# Patient Record
Sex: Female | Born: 1970 | Race: White | Hispanic: No | Smoking: Never smoker
Health system: Southern US, Community
[De-identification: ages and names within clinical notes are randomized; demographics above are authoritative.]

## PROBLEM LIST (undated history)

## (undated) DIAGNOSIS — J309 Allergic rhinitis, unspecified: Secondary | ICD-10-CM

## (undated) DIAGNOSIS — M1990 Unspecified osteoarthritis, unspecified site: Secondary | ICD-10-CM

## (undated) DIAGNOSIS — R569 Unspecified convulsions: Secondary | ICD-10-CM

## (undated) DIAGNOSIS — M359 Systemic involvement of connective tissue, unspecified: Secondary | ICD-10-CM

## (undated) DIAGNOSIS — IMO0002 Reserved for concepts with insufficient information to code with codable children: Secondary | ICD-10-CM

## (undated) DIAGNOSIS — Z982 Presence of cerebrospinal fluid drainage device: Secondary | ICD-10-CM

## (undated) DIAGNOSIS — A048 Other specified bacterial intestinal infections: Secondary | ICD-10-CM

## (undated) DIAGNOSIS — J45909 Unspecified asthma, uncomplicated: Secondary | ICD-10-CM

## (undated) DIAGNOSIS — R946 Abnormal results of thyroid function studies: Secondary | ICD-10-CM

## (undated) DIAGNOSIS — K219 Gastro-esophageal reflux disease without esophagitis: Secondary | ICD-10-CM

## (undated) DIAGNOSIS — N644 Mastodynia: Secondary | ICD-10-CM

## (undated) DIAGNOSIS — F32A Depression, unspecified: Secondary | ICD-10-CM

## (undated) DIAGNOSIS — R5382 Chronic fatigue, unspecified: Secondary | ICD-10-CM

## (undated) DIAGNOSIS — I10 Essential (primary) hypertension: Secondary | ICD-10-CM

## (undated) DIAGNOSIS — F3340 Major depressive disorder, recurrent, in remission, unspecified: Secondary | ICD-10-CM

## (undated) DIAGNOSIS — M329 Systemic lupus erythematosus, unspecified: Secondary | ICD-10-CM

## (undated) DIAGNOSIS — G894 Chronic pain syndrome: Secondary | ICD-10-CM

## (undated) DIAGNOSIS — G8929 Other chronic pain: Secondary | ICD-10-CM

## (undated) DIAGNOSIS — Q031 Atresia of foramina of Magendie and Luschka: Secondary | ICD-10-CM

## (undated) DIAGNOSIS — J019 Acute sinusitis, unspecified: Secondary | ICD-10-CM

## (undated) DIAGNOSIS — R768 Other specified abnormal immunological findings in serum: Secondary | ICD-10-CM

## (undated) DIAGNOSIS — S065X9A Traumatic subdural hemorrhage with loss of consciousness of unspecified duration, initial encounter: Secondary | ICD-10-CM

## (undated) DIAGNOSIS — F329 Major depressive disorder, single episode, unspecified: Secondary | ICD-10-CM

## (undated) DIAGNOSIS — M6702 Short Achilles tendon (acquired), left ankle: Secondary | ICD-10-CM

## (undated) DIAGNOSIS — D242 Benign neoplasm of left breast: Secondary | ICD-10-CM

## (undated) DIAGNOSIS — T7840XA Allergy, unspecified, initial encounter: Secondary | ICD-10-CM

## (undated) DIAGNOSIS — R42 Dizziness and giddiness: Secondary | ICD-10-CM

## (undated) DIAGNOSIS — I6203 Nontraumatic chronic subdural hemorrhage: Secondary | ICD-10-CM

## (undated) DIAGNOSIS — G43909 Migraine, unspecified, not intractable, without status migrainosus: Secondary | ICD-10-CM

## (undated) DIAGNOSIS — F419 Anxiety disorder, unspecified: Secondary | ICD-10-CM

## (undated) DIAGNOSIS — R7303 Prediabetes: Secondary | ICD-10-CM

## (undated) DIAGNOSIS — M170 Bilateral primary osteoarthritis of knee: Secondary | ICD-10-CM

## (undated) DIAGNOSIS — M25562 Pain in left knee: Secondary | ICD-10-CM

## (undated) DIAGNOSIS — A6000 Herpesviral infection of urogenital system, unspecified: Secondary | ICD-10-CM

## (undated) DIAGNOSIS — G919 Hydrocephalus, unspecified: Secondary | ICD-10-CM

## (undated) DIAGNOSIS — R1084 Generalized abdominal pain: Secondary | ICD-10-CM

## (undated) DIAGNOSIS — R1031 Right lower quadrant pain: Secondary | ICD-10-CM

## (undated) DIAGNOSIS — E785 Hyperlipidemia, unspecified: Secondary | ICD-10-CM

## (undated) DIAGNOSIS — R350 Frequency of micturition: Secondary | ICD-10-CM

## (undated) DIAGNOSIS — K625 Hemorrhage of anus and rectum: Secondary | ICD-10-CM

## (undated) DIAGNOSIS — E782 Mixed hyperlipidemia: Secondary | ICD-10-CM

## (undated) DIAGNOSIS — E538 Deficiency of other specified B group vitamins: Secondary | ICD-10-CM

## (undated) DIAGNOSIS — Z1239 Encounter for other screening for malignant neoplasm of breast: Secondary | ICD-10-CM

## (undated) DIAGNOSIS — R635 Abnormal weight gain: Secondary | ICD-10-CM

## (undated) DIAGNOSIS — M2391 Unspecified internal derangement of right knee: Secondary | ICD-10-CM

## (undated) DIAGNOSIS — M2550 Pain in unspecified joint: Secondary | ICD-10-CM

## (undated) DIAGNOSIS — G40909 Epilepsy, unspecified, not intractable, without status epilepticus: Secondary | ICD-10-CM

## (undated) DIAGNOSIS — Z8781 Personal history of (healed) traumatic fracture: Secondary | ICD-10-CM

## (undated) DIAGNOSIS — M722 Plantar fascial fibromatosis: Secondary | ICD-10-CM

## (undated) DIAGNOSIS — E559 Vitamin D deficiency, unspecified: Secondary | ICD-10-CM

## (undated) DIAGNOSIS — F411 Generalized anxiety disorder: Secondary | ICD-10-CM

## (undated) HISTORY — DX: Unspecified asthma, uncomplicated: J45.909

## (undated) HISTORY — DX: Allergy, unspecified, initial encounter: T78.40XA

## (undated) HISTORY — DX: Essential (primary) hypertension: I10

## (undated) HISTORY — DX: Hyperlipidemia, unspecified: E78.5

## (undated) HISTORY — DX: Other chronic pain: G89.29

## (undated) HISTORY — DX: Unspecified internal derangement of right knee: M23.91

## (undated) HISTORY — DX: Reserved for concepts with insufficient information to code with codable children: IMO0002

## (undated) HISTORY — DX: Acute sinusitis, unspecified: J01.90

## (undated) HISTORY — DX: Benign neoplasm of left breast: D24.2

## (undated) HISTORY — DX: Generalized abdominal pain: R10.84

## (undated) HISTORY — DX: Nontraumatic chronic subdural hemorrhage: I62.03

## (undated) HISTORY — DX: Allergic rhinitis, unspecified: J30.9

## (undated) HISTORY — DX: Other specified abnormal immunological findings in serum: R76.8

## (undated) HISTORY — DX: Herpesviral infection of urogenital system, unspecified: A60.00

## (undated) HISTORY — DX: Right lower quadrant pain: R10.31

## (undated) HISTORY — DX: Systemic involvement of connective tissue, unspecified: M35.9

## (undated) HISTORY — DX: Encounter for other screening for malignant neoplasm of breast: Z12.39

## (undated) HISTORY — DX: Major depressive disorder, recurrent, in remission, unspecified: F33.40

## (undated) HISTORY — DX: Major depressive disorder, single episode, unspecified: F32.9

## (undated) HISTORY — DX: Chronic pain syndrome: G89.4

## (undated) HISTORY — DX: Abnormal results of thyroid function studies: R94.6

## (undated) HISTORY — DX: Dizziness and giddiness: R42

## (undated) HISTORY — DX: Other specified bacterial intestinal infections: A04.8

## (undated) HISTORY — DX: Hemorrhage of anus and rectum: K62.5

## (undated) HISTORY — DX: Personal history of (healed) traumatic fracture: Z87.81

## (undated) HISTORY — DX: Frequency of micturition: R35.0

## (undated) HISTORY — DX: Pain in unspecified joint: M25.50

## (undated) HISTORY — DX: Migraine, unspecified, not intractable, without status migrainosus: G43.909

## (undated) HISTORY — DX: Prediabetes: R73.03

## (undated) HISTORY — DX: Chronic fatigue, unspecified: R53.82

## (undated) HISTORY — DX: Deficiency of other specified B group vitamins: E53.8

## (undated) HISTORY — DX: Plantar fascial fibromatosis: M72.2

## (undated) HISTORY — DX: Mixed hyperlipidemia: E78.2

## (undated) HISTORY — DX: Atresia of foramina of Magendie and Luschka: Q03.1

## (undated) HISTORY — DX: Short Achilles tendon (acquired), left ankle: M67.02

## (undated) HISTORY — DX: Hydrocephalus, unspecified: G91.9

## (undated) HISTORY — DX: Vitamin D deficiency, unspecified: E55.9

## (undated) HISTORY — DX: Depression, unspecified: F32.A

## (undated) HISTORY — DX: Abnormal weight gain: R63.5

## (undated) HISTORY — DX: Epilepsy, unspecified, not intractable, without status epilepticus: G40.909

## (undated) HISTORY — DX: Generalized anxiety disorder: F41.1

## (undated) HISTORY — DX: Traumatic subdural hemorrhage with loss of consciousness of unspecified duration, initial encounter: S06.5X9A

## (undated) HISTORY — PX: SHUNT REVISION: SHX343

## (undated) HISTORY — DX: Mastodynia: N64.4

## (undated) HISTORY — DX: Anxiety disorder, unspecified: F41.9

## (undated) HISTORY — DX: Presence of cerebrospinal fluid drainage device: Z98.2

## (undated) HISTORY — DX: Pain in left knee: M25.562

## (undated) HISTORY — DX: Bilateral primary osteoarthritis of knee: M17.0

## (undated) HISTORY — DX: Systemic lupus erythematosus, unspecified: M32.9

## (undated) HISTORY — DX: Unspecified convulsions: R56.9

---
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
## 1993-09-18 HISTORY — PX: TUBAL LIGATION: SHX77

## 1998-09-18 HISTORY — PX: LAPAROSCOPIC REVISION VENTRICULAR-PERITONEAL (V-P) SHUNT: SHX5924

## 1999-12-17 ENCOUNTER — Emergency Department (HOSPITAL_COMMUNITY): Admission: EM | Admit: 1999-12-17 | Discharge: 1999-12-17 | Payer: Self-pay | Admitting: Emergency Medicine

## 1999-12-18 ENCOUNTER — Encounter: Payer: Self-pay | Admitting: Emergency Medicine

## 2000-09-18 HISTORY — PX: LEG SURGERY: SHX1003

## 2003-08-18 ENCOUNTER — Other Ambulatory Visit: Payer: Self-pay

## 2003-08-19 ENCOUNTER — Other Ambulatory Visit: Payer: Self-pay

## 2003-09-19 HISTORY — PX: FRACTURE SURGERY: SHX138

## 2004-05-15 ENCOUNTER — Other Ambulatory Visit: Payer: Self-pay

## 2004-09-17 ENCOUNTER — Emergency Department: Payer: Self-pay | Admitting: Emergency Medicine

## 2005-01-30 ENCOUNTER — Encounter: Admission: RE | Admit: 2005-01-30 | Discharge: 2005-01-30 | Payer: Self-pay | Admitting: Family Medicine

## 2005-08-31 ENCOUNTER — Emergency Department: Payer: Self-pay | Admitting: General Practice

## 2005-11-07 ENCOUNTER — Other Ambulatory Visit: Payer: Self-pay

## 2005-11-07 ENCOUNTER — Emergency Department: Payer: Self-pay | Admitting: Emergency Medicine

## 2005-11-21 ENCOUNTER — Ambulatory Visit: Payer: Self-pay | Admitting: Family Medicine

## 2005-11-24 ENCOUNTER — Emergency Department: Payer: Self-pay | Admitting: Emergency Medicine

## 2005-11-28 ENCOUNTER — Ambulatory Visit: Payer: Self-pay | Admitting: Physician Assistant

## 2006-05-11 ENCOUNTER — Ambulatory Visit: Payer: Self-pay | Admitting: Family Medicine

## 2006-09-18 HISTORY — PX: ABDOMINAL HYSTERECTOMY: SHX81

## 2006-10-03 ENCOUNTER — Emergency Department: Payer: Self-pay | Admitting: Emergency Medicine

## 2006-11-04 ENCOUNTER — Emergency Department: Payer: Self-pay | Admitting: Emergency Medicine

## 2007-02-26 ENCOUNTER — Ambulatory Visit: Payer: Self-pay

## 2007-02-28 ENCOUNTER — Inpatient Hospital Stay: Payer: Self-pay

## 2007-05-15 ENCOUNTER — Emergency Department: Payer: Self-pay | Admitting: Emergency Medicine

## 2008-05-18 ENCOUNTER — Ambulatory Visit: Payer: Self-pay

## 2008-10-07 ENCOUNTER — Encounter: Admission: RE | Admit: 2008-10-07 | Discharge: 2008-10-07 | Payer: Self-pay | Admitting: Neurology

## 2008-10-08 ENCOUNTER — Emergency Department (HOSPITAL_COMMUNITY): Admission: EM | Admit: 2008-10-08 | Discharge: 2008-10-08 | Payer: Self-pay | Admitting: Emergency Medicine

## 2009-03-09 ENCOUNTER — Ambulatory Visit: Payer: Self-pay | Admitting: Family Medicine

## 2009-03-12 ENCOUNTER — Ambulatory Visit: Payer: Self-pay | Admitting: Family Medicine

## 2009-04-06 ENCOUNTER — Ambulatory Visit: Payer: Self-pay | Admitting: Unknown Physician Specialty

## 2009-04-08 ENCOUNTER — Ambulatory Visit: Payer: Self-pay | Admitting: Gastroenterology

## 2009-08-06 ENCOUNTER — Ambulatory Visit: Payer: Self-pay

## 2009-08-31 ENCOUNTER — Emergency Department: Payer: Self-pay | Admitting: Emergency Medicine

## 2010-02-17 ENCOUNTER — Ambulatory Visit: Payer: Self-pay | Admitting: Physician Assistant

## 2010-07-21 ENCOUNTER — Ambulatory Visit: Payer: Self-pay

## 2010-08-16 ENCOUNTER — Encounter: Admission: RE | Admit: 2010-08-16 | Discharge: 2010-08-16 | Payer: Self-pay | Admitting: Neurology

## 2010-09-16 ENCOUNTER — Ambulatory Visit: Payer: Self-pay | Admitting: Otolaryngology

## 2011-01-31 NOTE — Consult Note (Signed)
[name], [name]               ACCOUNT NO.:  [id number]   MEDICAL RECORD NO.:  [id number]          PATIENT TYPE:  EMS   LOCATION:  MAJO                         FACILITY:  [facility]   PHYSICIAN:  Deanna Artis. Hickling, M.D.DATE OF BIRTH:  02/09/1971   DATE OF CONSULTATION:  10/08/2008  DATE OF DISCHARGE:                                 CONSULTATION   CHIEF COMPLAINT:  Recurrent headaches and dizziness.   HISTORY OF PRESENT CONDITION:  [name] is a 40 year old right-  handed Caucasian female who has a longstanding history of headaches.  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   The patient was diagnosed in 2000 and underwent VP shunt placement with  cessation of her headaches.  She had revision in 2003.  She now only had  severe headache, but also lost vision temporarily in her right eye.   Since that time,  I think that she has had other times when it was  thought that she might have failure of shunt, but it has worked well.   Six months ago without a triggering event, she began to have frequent  headaches that were monthly.  Headaches have become daily.  The patient  has had pounding headache, severe sensory to light and sound and  incapacitation for the last couple of months.  She has been treated with  Excedrin Migraine and Percocet with limited health.  She has had no  change in her vision.   The patient also complained of dizziness by which she means sudden onset  of counterclockwise vertigo with nausea, ringing in the her right ear.  This has been intermittent.  Again in April 2009, it was not presently a  problem.   REVIEW OF SYSTEMS:  The patient has had fainting, problems with weight  gain, snoring, headache, dizziness, easy bruisability, anxiety,  indigestion, stomachache, joint pains, hematuria, and occasional  bladder  incontinence.   PAST MEDICAL HISTORY:  1. Migraine.  2. Depression.  3. Anxiety.  4. Chronic pain.  5. Nonepileptic seizures.   PAST SURGICAL HISTORY:  The patient has had treatment with [facility]  rods for scoliosis.  She had a cervical fixation procedure in 1985 for  fracture of her cervical spine and ventriculoperitoneal shunt in 2000  with replacement in 2003, hysterectomy, and tubal ligation.   FAMILY HISTORY:  The patient's father had heart disease.  Mother had  diabetes and heart disease.  Brother has suffered migraine, cancer, and  dyslipidemia.  Mother has had leukemia.  Sister has a brain tumor.   SOCIAL HISTORY:  The patient is divorced.  She has a daughter and a son.  She does not use tobacco.  She drinks alcohol occasionally.  She has 11  years of education.  She is on disability.   CURRENT MEDICATIONS:  Prozac 80 mg daily, but stopped medications 3  weeks ago.   DRUG ALLERGIES:  PENICILLIN, SULFA, and CODEINE.   MRI scan of the brain carried out  earlier this week at [facility].  This showed  massive dilatation of her lateral ventricles and fourth ventricle,  agenesis of corpus callosum and suggested that there was evidence of  increased intracranial pressure.  She has a shunt in the right posterior  portion of her head that cannot be easily seen on the MRI scan.  I have  no films to compare this to.   PHYSICAL EXAMINATION:  GENERAL:  Today, pleasant woman in moderate  distress due to her pain.  VITAL SIGNS:  Temperature 97.0, blood pressure 149/80, resting pulse 69,  and respirations 18.  HEAD, EYES, EARS, NOSE, AND THROAT:  No signs of infection.  NECK:  Supple.  Full range of motion.  No cranial or cervical bruits.  LUNGS:  Clear to auscultation.  HEART:  No murmurs.  Pulses normal.  ABDOMEN:  Soft and nontender.  Bowel sounds normal.  EXTREMITIES:  Well-formed without edema, cyanosis, alterations in tone,  or tight heel cords.  I was able to pump her  shunt and it seems to pump  and refill.  She does not have meningismus.  NEUROLOGIC:  The patient is awake and alert and right-handed.  Cranial  nerves:  Round and reactive pupils.  Visual fields full.  Extraocular  movements full and conjugate.  Her disk margins are sharp.  There are no  venous pulsations, but I can see symmetric facial strength.  Midline  tongue and uvula.  Air conduction greater than bone conduction  bilaterally.  Motor examination:  Normal strength, tone, and mass.  Good  fine motor movements.  No pronator drift.  Sensation intact to cold  vibration or stereognosis.  Cerebellar examination good finger-to-nose.  Rapid alternating movements.  Gait was not tested.  Deep tendon reflexes  were brisk without clonus.  The patient had bilateral flexor plantar  responses.   IMPRESSION:  1. Dandy-Walker cyst. 742.2  2. 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 331.3   PLAN:  We will obtain a shunt series.  I have placed a call to Dr.  Meryl Dare at [facility] and I am awaiting his  recall, telephone number [phone].  I see no recourse to  transferring her to [facility] for evaluation of the old films and the new  films.  If it is determined that she does not need further intervention,  then we will have to try to deal with her headaches as they are.      Deanna Artis. Sharene Skeans, [name]  Electronically Signed     [facility]  D:  10/08/2008  T:  10/09/2008  Job:  [id number]   cc:   Burnell Blanks, MD  Meryl Dare

## 2011-03-06 ENCOUNTER — Ambulatory Visit: Payer: Self-pay | Admitting: Family Medicine

## 2011-07-25 ENCOUNTER — Ambulatory Visit (HOSPITAL_COMMUNITY): Payer: Medicare Other

## 2011-07-25 ENCOUNTER — Other Ambulatory Visit: Payer: Self-pay | Admitting: Rheumatology

## 2011-07-25 ENCOUNTER — Ambulatory Visit
Admission: RE | Admit: 2011-07-25 | Discharge: 2011-07-25 | Disposition: A | Discharge status: Home / Self Care | Payer: Medicare Other | Source: Ambulatory Visit | Attending: Rheumatology | Admitting: Rheumatology

## 2011-07-25 DIAGNOSIS — M2550 Pain in unspecified joint: Secondary | ICD-10-CM

## 2011-09-25 ENCOUNTER — Ambulatory Visit: Payer: Self-pay | Admitting: Obstetrics and Gynecology

## 2012-05-07 ENCOUNTER — Other Ambulatory Visit: Payer: Self-pay | Admitting: Neurology

## 2012-05-07 DIAGNOSIS — R42 Dizziness and giddiness: Secondary | ICD-10-CM

## 2012-05-07 DIAGNOSIS — R51 Headache: Secondary | ICD-10-CM

## 2012-05-07 DIAGNOSIS — G911 Obstructive hydrocephalus: Secondary | ICD-10-CM

## 2012-05-07 DIAGNOSIS — R279 Unspecified lack of coordination: Secondary | ICD-10-CM

## 2012-05-09 ENCOUNTER — Ambulatory Visit
Admission: RE | Admit: 2012-05-09 | Discharge: 2012-05-09 | Disposition: A | Discharge status: Home / Self Care | Payer: Medicare Other | Source: Ambulatory Visit | Attending: Neurology | Admitting: Neurology

## 2012-05-09 DIAGNOSIS — R42 Dizziness and giddiness: Secondary | ICD-10-CM

## 2012-05-09 DIAGNOSIS — R51 Headache: Secondary | ICD-10-CM

## 2012-05-09 DIAGNOSIS — R279 Unspecified lack of coordination: Secondary | ICD-10-CM

## 2012-05-09 DIAGNOSIS — G911 Obstructive hydrocephalus: Secondary | ICD-10-CM

## 2013-06-19 ENCOUNTER — Other Ambulatory Visit: Payer: Self-pay | Admitting: Neurology

## 2013-09-18 DIAGNOSIS — A048 Other specified bacterial intestinal infections: Secondary | ICD-10-CM | POA: Insufficient documentation

## 2013-09-18 HISTORY — DX: Other specified bacterial intestinal infections: A04.8

## 2013-10-07 ENCOUNTER — Encounter: Payer: Self-pay | Admitting: *Deleted

## 2013-10-20 ENCOUNTER — Ambulatory Visit: Payer: Self-pay | Admitting: Family Medicine

## 2013-10-27 ENCOUNTER — Ambulatory Visit (INDEPENDENT_AMBULATORY_CARE_PROVIDER_SITE_OTHER): Payer: Medicare HMO | Admitting: General Surgery

## 2013-10-27 ENCOUNTER — Encounter: Payer: Self-pay | Admitting: General Surgery

## 2013-10-27 ENCOUNTER — Other Ambulatory Visit: Payer: Medicare HMO

## 2013-10-27 VITALS — BP 122/78 | HR 78 | Resp 12 | Ht 67.0 in | Wt 209.0 lb

## 2013-10-27 DIAGNOSIS — N63 Unspecified lump in unspecified breast: Secondary | ICD-10-CM

## 2013-10-27 DIAGNOSIS — R590 Localized enlarged lymph nodes: Secondary | ICD-10-CM

## 2013-10-27 DIAGNOSIS — R599 Enlarged lymph nodes, unspecified: Secondary | ICD-10-CM

## 2013-10-27 NOTE — Progress Notes (Signed)
Patient ID: [name], female   DOB: August 26, 1971, 43 y.o.   MRN: [id number]  Chief Complaint  Patient presents with  . Other    left breast    HPI [name] is a 43 y.o. female here today for an breast evaluation. Patient was seen in Dr. Vernie Ammons on 09/30/13 for her annual exam. She felt an lump in her left breast at the end of December. The patient reports that she is frequently had lumps in her breast, but usually resolve within one month. This area may have enlarged since initial discovery.   She still appreciates cyclic breast tenderness, more pronounced since her hysterectomy. While she reports bilateral tenderness, the left is usually more symptomatic.The present area in her breast was different in that the area did not resolve.   Patient last mammogram was in 09/30/13 cat 1. Patient does perform self breast checks and gets regular mammogram.  She states sometime the left nipple is hot and itchy.  No discharge or drainage from the nipple has been appreciated. The patient denies any history of trauma. She is accompanied today by her husband who was present for the interview and exam.   HPI  Past Medical History  Diagnosis Date  . Herpes genitalia   . Lupus   . Depression   . Hyperlipidemia   . Anxiety     Past Surgical History  Procedure Laterality Date  . Tubal ligation  1995  . Neck surgery  1985  . Laparoscopic revision ventricular-peritoneal (v-p) shunt  2000  . Leg surgery Left 2002  . Abdominal hysterectomy  2008  . Posterior laminectomy thoracic and lumbar spine Bilateral 1985    Scoliosis stabilization    No family history on file.  Social History History  Substance Use Topics  . Smoking status: Former Smoker -- 1.00 packs/day for 15 years  . Smokeless tobacco: Never Used  . Alcohol Use: No    Allergies  Allergen Reactions  . Codeine Swelling  . Penicillins Swelling  . Sulfa Antibiotics Swelling    Current Outpatient Prescriptions   Medication Sig Dispense Refill  . citalopram (CELEXA) 40 MG tablet Take 40 mg by mouth daily.       [name] LORazepam (ATIVAN) 1 MG tablet Take 1 mg by mouth at bedtime.       . Multiple Vitamins-Minerals (MULTIVITAMIN WITH MINERALS) tablet Take 1 tablet by mouth daily.      . simvastatin (ZOCOR) 20 MG tablet Take 20 mg by mouth daily.       [name] topiramate (TOPAMAX) 50 MG tablet TAKE ONE TABLET BY MOUTH 2 TIMES A DAY  60 tablet  0  . triamterene-hydrochlorothiazide (MAXZIDE-25) 37.5-25 MG per tablet       . valACYclovir (VALTREX) 500 MG tablet Take 500 mg by mouth daily.        No current facility-administered medications for this visit.    Review of Systems Review of Systems  Constitutional: Negative.   Respiratory: Negative.   Cardiovascular: Negative.     Blood pressure 122/78, pulse 78, resp. rate 12, height 5\' 7"  (1.702 m), weight 209 lb (94.802 kg).  Physical Exam Physical Exam  Constitutional: She is oriented to person, place, and time. She appears well-developed and well-nourished.  Eyes: Conjunctivae are normal.  Neck: Neck supple.  Cardiovascular: Normal rate, regular rhythm and normal heart sounds.   Pulmonary/Chest: Effort normal. No respiratory distress. She has decreased breath sounds. She has wheezes in the left lower field. Right breast  exhibits no inverted nipple, no mass, no nipple discharge, no skin change and no tenderness. Left breast exhibits no inverted nipple, no mass, no nipple discharge, no skin change and no tenderness. Breasts are symmetrical.    Lymphadenopathy:    She has no cervical adenopathy.    She has no axillary adenopathy (No palpable adenopathy.).  Neurological: She is alert and oriented to person, place, and time.  Skin: Skin is warm and dry.    Data Reviewed Bilateral mammogram dated September 30, 2013 were reported as BI-RAD-2: Extremely dense breast. No interval change. Bilateral punctate calcifications noted.  The January 2014 mammograms were  available for review. No areas of concern.  Ultrasound examination of the left breast was completed to better assess the area of focal thickening in the 2:00 position. In the left breast at the 2:30 o'clock position 3 cm from the nipple a near anechoic nodule measuring 0.52 x 0.8 x 0.8 cm was identified. No vascular flow was identified. This could represent a resolving cyst or a fibroadenoma. Observation is warranted.  In the area of palpable thickening at the 2:00 position of the left breast, 6 cm from the nipple a 0.5 x 0.7 x 1.0 cm structure with a hyperechoic core and a hypoechoic rim consistent with a lymph node was identified. Initially this was thought to be larger measuring up to 1.47 cm, but this was artifact from the underlying tissue.  In the left breast 8 cm from the nipple at the 2:00 position multiple cystic structures were identified measuring in aggregate up to 0.5 cm in diameter.  In the lower level of the axilla there appeared to be a moderately enlarged lymph node measuring 0.9 x 1.12 x 1.73 cm.  Assessment    Focal adenopathy left breast, no inciting source identified on clinical exam.     Plan    Considering her normal mammograms last month and fairly benign exam today, I recommended a period of observation. We'll reassess the areas noted in the left breast and proceed with biopsy if they have enlarged in size or continued observation if not. This plan was reviewed with the patient and her husband who are agreeable.        Robert Bellow 10/27/2013, 9:58 PM

## 2013-10-27 NOTE — Patient Instructions (Signed)
Patient to return in three months for an left  Breast ultrasound.

## 2014-01-16 HISTORY — PX: BREAST BIOPSY: SHX20

## 2014-01-16 HISTORY — PX: BREAST EXCISIONAL BIOPSY: SUR124

## 2014-01-26 ENCOUNTER — Ambulatory Visit (INDEPENDENT_AMBULATORY_CARE_PROVIDER_SITE_OTHER): Payer: Medicare HMO | Admitting: General Surgery

## 2014-01-26 ENCOUNTER — Other Ambulatory Visit: Payer: Medicare HMO

## 2014-01-26 ENCOUNTER — Encounter: Payer: Self-pay | Admitting: General Surgery

## 2014-01-26 VITALS — BP 124/76 | HR 76 | Resp 14 | Ht 69.0 in | Wt 209.0 lb

## 2014-01-26 DIAGNOSIS — N644 Mastodynia: Secondary | ICD-10-CM

## 2014-01-26 DIAGNOSIS — N63 Unspecified lump in unspecified breast: Secondary | ICD-10-CM

## 2014-01-26 DIAGNOSIS — R590 Localized enlarged lymph nodes: Secondary | ICD-10-CM

## 2014-01-26 DIAGNOSIS — R599 Enlarged lymph nodes, unspecified: Secondary | ICD-10-CM

## 2014-01-26 NOTE — Patient Instructions (Addendum)
Patient to be scheduled for left breast surgery. The patient is aware to call back for any questions or concerns.  Patient is scheduled for surgery at [facility] on 02/04/14. She will pre admit by phone on 01/29/14. Patient is aware of dates and all instructions.

## 2014-01-26 NOTE — Progress Notes (Signed)
Patient ID: [name], female   DOB: 04-17-1971, 42 y.o.   MRN: [id number]  Chief Complaint  Patient presents with  . Follow-up    3 month follow up breast mass    HPI [name] is a 43 y.o. female who presents for a 3 month follow up of a left breast mass. The patient states she is still having some pain that comes and goes. The pain is described as a twisting sensation. The pain is located in the upper outer quadrant of the left breast. She states the pain has recently begun to radiate to her back. The pain is mainly noticed with touch. Any pressure on the breast produces pain. Light touch such as clothing moving over the upper-outer quadrant of the breast does not produce symptoms. The pain is on a daily basis. She reports this will last for 5-10 minutes after onset. It rarely occurs more than once per day. She describes it is worse than childbirth. She is not able to pinpoint any discrete function involving the left shoulder that will precipitate her pain. A heating pad helps some. Her appetite has decreased. No known injuries to the breasts. She has developed a cough since her last visit. She reports that she has had a chest x-ray by her primary care physician.  The patient was initially evaluated in February 2015 for a reported left breast mass. No mass was clinically palpable at the time of that exam.  HPI  Past Medical History  Diagnosis Date  . Herpes genitalia   . Lupus   . Depression   . Hyperlipidemia   . Anxiety     Past Surgical History  Procedure Laterality Date  . Tubal ligation  1995  . Neck surgery  1985  . Laparoscopic revision ventricular-peritoneal (v-p) shunt  2000  . Leg surgery Left 2002  . Abdominal hysterectomy  2008  . Posterior laminectomy thoracic and lumbar spine Bilateral 1985    Scoliosis stabilization  . Spine surgery  1980    Scoliosis    Family History  Problem Relation Age of Onset  . Leukemia Mother 61    Social  History History  Substance Use Topics  . Smoking status: Former Smoker -- 1.00 packs/day for 15 years  . Smokeless tobacco: Never Used  . Alcohol Use: No    Allergies  Allergen Reactions  . Codeine Swelling  . Penicillins Swelling  . Sulfa Antibiotics Swelling    Current Outpatient Prescriptions  Medication Sig Dispense Refill  . Cholecalciferol (VITAMIN D) 2000 UNITS tablet Take 2,000 Units by mouth daily.      . citalopram (CELEXA) 40 MG tablet Take 40 mg by mouth daily.       [name] LORazepam (ATIVAN) 1 MG tablet Take 1 mg by mouth at bedtime.       . lovastatin (MEVACOR) 20 MG tablet Take 1 tablet by mouth daily.      . Multiple Vitamins-Minerals (MULTIVITAMIN WITH MINERALS) tablet Take 1 tablet by mouth daily.      . rizatriptan (MAXALT) 10 MG tablet Take 1 tablet by mouth daily.      [name] topiramate (TOPAMAX) 50 MG tablet TAKE ONE TABLET BY MOUTH 2 TIMES A DAY  60 tablet  0  . valACYclovir (VALTREX) 500 MG tablet Take 500 mg by mouth daily.        No current facility-administered medications for this visit.    Review of Systems Review of Systems  Constitutional: Negative.   Respiratory:  Negative.   Cardiovascular: Negative.     Blood pressure 124/76, pulse 76, resp. rate 14, height 5\' 9"  (1.753 m), weight 209 lb (94.802 kg). The patient reports a decreased appetite, but her weight is unchanged from her past exam on 10/27/2013. Physical Exam Physical Exam  Constitutional: She is oriented to person, place, and time. She appears well-developed and well-nourished.  Neck: Neck supple. No thyromegaly present.  Cardiovascular: Normal rate, regular rhythm and normal heart sounds.   No murmur heard. Pulmonary/Chest: Effort normal and breath sounds normal. Right breast exhibits no inverted nipple, no mass, no nipple discharge, no skin change and no tenderness. Left breast exhibits tenderness. Left breast exhibits no inverted nipple, no mass, no nipple discharge and no skin change.  Breasts are symmetrical (no visible asymmetry is appreciated involving either breast. No evidence of hyperemia, traction or skin thickening.).    Tender at the bottom of the scapula on the left.   Tender along the pectoralis muscle.   Musculoskeletal:       Arms: Lymphadenopathy:    She has no cervical adenopathy.    She has no axillary adenopathy.  Neurological: She is alert and oriented to person, place, and time.  Skin: Skin is warm and dry.    Data Reviewed Chest x-ray dated 10/20/2013 was reviewed. Ventriculoperitoneal shunt is identified. Posterior cervical and thoracic fixation rod noted. Chest x-ray otherwise showed no cardiopulmonary disease. The chest x-ray was obtained for reports of chest pain and nonproductive cough as well as edema.  Laboratory studies dated 01/19/2014 showed normal competence of metabolic panel. Elevated lipids per  The 09/30/2013 mammograms had previously been reported to show dense breast without interval change from the 2014 exam which was available for review.  Ultrasound examination of the left axilla again shows an enlarged lymph node in the inferior aspect. This measures 1.1 x 1.5 x 1.72 cm in diameter. There is a prominent hilum with scant cortex. At the time of her February exam this measured 0.9 x 1.1 x 1.7 cm per heart increased in size and change in cortex/hilum ratio.  In the 2:00 position the left breast 6 cm from nipple line appears to be an intramammary lymph node measuring 0.5 x 0.7 x 0.8 cm is noted. At the 2:00 position 3 cm from the nipple a hypoechoic mass with marginal acoustic enhancement measuring 0.5 x 0.73 x 0.76 cm is identified. This is essentially unchanged from past exam. At the 2:00 position again 6 cm from nipple a superficially located mass measuring 0.66 x 0.76 x 0.7 cm is noted. This also with the parents of a lymph node. At the 2:00 position of the left breast 8 cm and nipple a cluster of small cyst measuring an aggregate up  to 0.9 cm is appreciated. This is slightly more prominent on her last exam. These are all smoothly marginated and showed good posterior acoustic enhancement.   Assessment    Left breast pain, axillary adenopathy.    Plan    The clinical exam does not correlate with the degree of patient reported pain. She is less tender in the axilla where the markedly enlarged lymph nodes evident as opposed to the breast were it appears by ultrasound several small intramammary nodes are identified. The dominant nodule near the areola is unchanged in size none past exams did not show increased vascular flow.  Because of her tenderness in the upper outer quadrant left breast doubly percutaneous biopsy without anesthesia will be uncomfortable and perhaps very  poorly tolerated. With the enlarged lymph node, and moderate increase in size without a clear clinical source, I think that she will benefit from excision of left axillary lymph node as well as core biopsy of the dominant nodule 2 cm from the nipple at 2:00 position at least one of the intramammary lymph node.  The patient reports that she has had laboratory studies completed with her PCP and these will be obtained prior to surgery for review.  The patient's husband was present for the interview and exam today.    Patient is scheduled for surgery at [facility] on 02/04/14. She will pre admit by phone on 01/29/14. Patient is aware of dates and all instructions.   PCP: Dear, Reola Mosher [place] 01/27/2014, 7:15 AM

## 2014-01-27 ENCOUNTER — Other Ambulatory Visit: Payer: Self-pay | Admitting: General Surgery

## 2014-01-27 DIAGNOSIS — N63 Unspecified lump in unspecified breast: Secondary | ICD-10-CM

## 2014-01-27 DIAGNOSIS — R079 Chest pain, unspecified: Secondary | ICD-10-CM

## 2014-01-27 DIAGNOSIS — R590 Localized enlarged lymph nodes: Secondary | ICD-10-CM

## 2014-01-27 DIAGNOSIS — N644 Mastodynia: Secondary | ICD-10-CM | POA: Insufficient documentation

## 2014-01-27 HISTORY — DX: Mastodynia: N64.4

## 2014-01-27 HISTORY — DX: Chest pain, unspecified: R07.9

## 2014-01-29 ENCOUNTER — Ambulatory Visit: Payer: Self-pay | Admitting: General Surgery

## 2014-01-29 LAB — CBC WITH DIFFERENTIAL/PLATELET
BASOS PCT: 1 %
Basophil #: 0.1 10*3/uL (ref 0.0–0.1)
EOS PCT: 2.5 %
Eosinophil #: 0.2 10*3/uL (ref 0.0–0.7)
HCT: 36.6 % (ref 35.0–47.0)
HGB: 12.4 g/dL (ref 12.0–16.0)
LYMPHS PCT: 26.5 %
Lymphocyte #: 2 10*3/uL (ref 1.0–3.6)
MCH: 29.8 pg (ref 26.0–34.0)
MCHC: 34.1 g/dL (ref 32.0–36.0)
MCV: 88 fL (ref 80–100)
MONO ABS: 0.3 x10 3/mm (ref 0.2–0.9)
Monocyte %: 3.7 %
NEUTROS ABS: 5 10*3/uL (ref 1.4–6.5)
Neutrophil %: 66.3 %
Platelet: 239 10*3/uL (ref 150–440)
RBC: 4.17 10*6/uL (ref 3.80–5.20)
RDW: 13.8 % (ref 11.5–14.5)
WBC: 7.5 10*3/uL (ref 3.6–11.0)

## 2014-01-30 ENCOUNTER — Encounter: Payer: Self-pay | Admitting: General Surgery

## 2014-02-04 ENCOUNTER — Ambulatory Visit: Payer: Self-pay | Admitting: General Surgery

## 2014-02-04 DIAGNOSIS — R599 Enlarged lymph nodes, unspecified: Secondary | ICD-10-CM

## 2014-02-04 DIAGNOSIS — N63 Unspecified lump in unspecified breast: Secondary | ICD-10-CM

## 2014-02-04 HISTORY — PX: BREAST SURGERY: SHX581

## 2014-02-05 ENCOUNTER — Encounter: Payer: Self-pay | Admitting: General Surgery

## 2014-02-05 LAB — PATHOLOGY REPORT

## 2014-02-06 ENCOUNTER — Telehealth: Payer: Self-pay

## 2014-02-06 ENCOUNTER — Encounter: Payer: Self-pay | Admitting: General Surgery

## 2014-02-06 NOTE — Telephone Encounter (Signed)
Notified patient as instructed, patient pleased. Discussed follow-up appointment, patient agrees.   

## 2014-02-06 NOTE — Telephone Encounter (Signed)
Message copied by Lesly Rubenstein on Fri Feb 06, 2014  8:45 AM ------      Message from: Darwin, Forest Gleason      Created: Fri Feb 06, 2014  7:18 AM       Please notify the patient that the breast biopsy showed a benign fibroadenoma, no cancer, and the lymph gland was also clear of any abnormality. Followup is scheduled. ------

## 2014-02-07 ENCOUNTER — Encounter: Payer: Self-pay | Admitting: General Surgery

## 2014-02-10 ENCOUNTER — Encounter: Payer: Self-pay | Admitting: *Deleted

## 2014-02-10 NOTE — Telephone Encounter (Signed)
corrected

## 2014-02-11 ENCOUNTER — Encounter: Payer: Self-pay | Admitting: General Surgery

## 2014-02-11 ENCOUNTER — Ambulatory Visit (INDEPENDENT_AMBULATORY_CARE_PROVIDER_SITE_OTHER): Payer: Medicare HMO | Admitting: General Surgery

## 2014-02-11 VITALS — BP 128/74 | HR 76 | Resp 14 | Ht 63.0 in | Wt 207.0 lb

## 2014-02-11 DIAGNOSIS — D249 Benign neoplasm of unspecified breast: Secondary | ICD-10-CM

## 2014-02-11 DIAGNOSIS — D242 Benign neoplasm of left breast: Secondary | ICD-10-CM

## 2014-02-11 DIAGNOSIS — R599 Enlarged lymph nodes, unspecified: Secondary | ICD-10-CM

## 2014-02-11 DIAGNOSIS — N63 Unspecified lump in unspecified breast: Secondary | ICD-10-CM

## 2014-02-11 DIAGNOSIS — R590 Localized enlarged lymph nodes: Secondary | ICD-10-CM

## 2014-02-11 NOTE — Progress Notes (Signed)
Patient ID: [name], female   DOB: 01-Nov-1970, 43 y.o.   MRN: [id number]  Chief Complaint  Patient presents with  . Follow-up    7-10 day post op left breast biopsy    HPI [name] is a 43 y.o. female who presents for a post op left breast core biopsy and excision of left axillary lymph node. The procedure was performed on 02/04/14. No new complaints at this time.   HPI  Past Medical History  Diagnosis Date  . Herpes genitalia   . Lupus   . Depression   . Hyperlipidemia   . Anxiety     Past Surgical History  Procedure Laterality Date  . Tubal ligation  1995  . Neck surgery  1985  . Laparoscopic revision ventricular-peritoneal (v-p) shunt  2000  . Leg surgery Left 2002  . Abdominal hysterectomy  2008  . Posterior laminectomy thoracic and lumbar spine Bilateral 1985    Scoliosis stabilization  . Spine surgery  1985    Scoliosis  . Breast surgery Left 02/04/14    left core bx and excision of left axillary lymph node    Family History  Problem Relation Age of Onset  . Leukemia Mother 78    Social History History  Substance Use Topics  . Smoking status: Never Smoker   . Smokeless tobacco: Never Used  . Alcohol Use: No    Allergies  Allergen Reactions  . Codeine Swelling  . Penicillins Swelling  . Sulfa Antibiotics Swelling    Current Outpatient Prescriptions  Medication Sig Dispense Refill  . Cholecalciferol (VITAMIN D) 2000 UNITS tablet Take 2,000 Units by mouth daily.      . citalopram (CELEXA) 40 MG tablet Take 40 mg by mouth daily.       [name] LORazepam (ATIVAN) 1 MG tablet Take 1 mg by mouth at bedtime.       . lovastatin (MEVACOR) 20 MG tablet Take 1 tablet by mouth daily.      . Multiple Vitamins-Minerals (MULTIVITAMIN WITH MINERALS) tablet Take 1 tablet by mouth daily.      . rizatriptan (MAXALT) 10 MG tablet Take 1 tablet by mouth daily.      [name] topiramate (TOPAMAX) 50 MG tablet TAKE ONE TABLET BY MOUTH 2 TIMES A DAY  60 tablet  0  .  valACYclovir (VALTREX) 500 MG tablet Take 500 mg by mouth daily.        No current facility-administered medications for this visit.    Review of Systems Review of Systems  Constitutional: Negative.   Respiratory: Negative.   Cardiovascular: Negative.     Blood pressure 128/74, pulse 76, resp. rate 14, height 5\' 3"  (1.6 m), weight 207 lb (93.895 kg).  Physical Exam Physical Exam  Constitutional: She is oriented to person, place, and time. She appears well-developed and well-nourished.  Pulmonary/Chest:  Little bruising in the left breast. Well healing incision sites.   Neurological: She is alert and oriented to person, place, and time.  Skin: Skin is warm and dry.    Data Reviewed Biopsy results of Feb 04, 2014 showed the left breast mass at 2:00 to represent a fibroadenoma. The 2 cm left axillary lymph node was benign.  Assessment    Improvement in left breast pain post biopsy.     Plan    Local he has been encouraged to help resolve the residual soreness and bruising. We'll plan on a followup exam in 3 months to see if she obtains long-term  benefit and control the local pain.     PCP: Dear, Reola Mosher [place] Rosete 02/12/2014, 9:52 PM

## 2014-02-11 NOTE — Patient Instructions (Signed)
Patient to return in 3 months

## 2014-02-12 DIAGNOSIS — D242 Benign neoplasm of left breast: Secondary | ICD-10-CM

## 2014-02-12 HISTORY — DX: Benign neoplasm of left breast: D24.2

## 2014-03-05 DIAGNOSIS — R42 Dizziness and giddiness: Secondary | ICD-10-CM

## 2014-03-05 DIAGNOSIS — R569 Unspecified convulsions: Secondary | ICD-10-CM | POA: Insufficient documentation

## 2014-03-05 HISTORY — DX: Dizziness and giddiness: R42

## 2014-03-05 HISTORY — DX: Unspecified convulsions: R56.9

## 2014-05-13 ENCOUNTER — Ambulatory Visit (INDEPENDENT_AMBULATORY_CARE_PROVIDER_SITE_OTHER): Payer: Medicare HMO | Admitting: General Surgery

## 2014-05-13 ENCOUNTER — Encounter: Payer: Self-pay | Admitting: General Surgery

## 2014-05-13 VITALS — BP 100/64 | HR 82 | Resp 16 | Ht 63.0 in | Wt 197.0 lb

## 2014-05-13 DIAGNOSIS — N644 Mastodynia: Secondary | ICD-10-CM

## 2014-05-13 NOTE — Progress Notes (Signed)
Patient ID: [name], female   DOB: 02/18/1971, 43 y.o.   MRN: [id number]  Chief Complaint  Patient presents with  . Follow-up    3 month follow up left breast biopsy    HPI [name] is a 43 y.o. female who presents for a 3 month follow up post left breast biopsy. No mammogram done at this time. The patient states she is still having some pain in the left breast at the biopsy site that has recently started back. She also complains of nausea that started at the same time as the pain started. The pain is described as a knife stabbing that comes and goes.  The patient was originally seen in February of 2015 with breast tenderness. Lymphadenopathy was also noted.  The patient had a number of complaints related to recent treatment for H. Pylori infection determined by blood testing, abdominal discomfort and multiple musculoskeletal complaints.  HPI  Past Medical History  Diagnosis Date  . Herpes genitalia   . Lupus   . Depression   . Hyperlipidemia   . Anxiety   . H. pylori infection 2015    Past Surgical History  Procedure Laterality Date  . Tubal ligation  1995  . Neck surgery  1985  . Laparoscopic revision ventricular-peritoneal (v-p) shunt  2000  . Leg surgery Left 2002  . Abdominal hysterectomy  2008  . Posterior laminectomy thoracic and lumbar spine Bilateral 1985    Scoliosis stabilization  . Spine surgery  1985    Scoliosis  . Breast surgery Left 02/04/14    left core bx and excision of left axillary lymph node    Family History  Problem Relation Age of Onset  . Leukemia Mother 77    Social History History  Substance Use Topics  . Smoking status: Never Smoker   . Smokeless tobacco: Never Used  . Alcohol Use: No    Allergies  Allergen Reactions  . Codeine Swelling  . Penicillins Swelling  . Sulfa Antibiotics Swelling    Current Outpatient Prescriptions  Medication Sig Dispense Refill  . citalopram (CELEXA) 40 MG tablet Take 40 mg by mouth  daily.       [name] LORazepam (ATIVAN) 1 MG tablet Take 1 mg by mouth at bedtime.       . lovastatin (MEVACOR) 20 MG tablet Take 1 tablet by mouth daily.      . montelukast (SINGULAIR) 10 MG tablet Take 1 tablet by mouth daily.      . Multiple Vitamins-Minerals (MULTIVITAMIN WITH MINERALS) tablet Take 1 tablet by mouth daily.      [name] omeprazole (PRILOSEC) 20 MG capsule Take 1 capsule by mouth as needed.      . rizatriptan (MAXALT) 10 MG tablet Take 1 tablet by mouth daily.      [name] topiramate (TOPAMAX) 50 MG tablet TAKE ONE TABLET BY MOUTH 2 TIMES A DAY  60 tablet  0  . triamterene-hydrochlorothiazide (MAXZIDE-25) 37.5-25 MG per tablet Take 1 tablet by mouth daily.      . valACYclovir (VALTREX) 500 MG tablet Take 500 mg by mouth daily.        No current facility-administered medications for this visit.    Review of Systems Review of Systems  Constitutional: Negative.   Respiratory: Negative.   Cardiovascular: Negative.     Blood pressure 100/64, pulse 82, resp. rate 16, height 5\' 3"  (1.6 m), weight 197 lb (89.359 kg).  Physical Exam Physical Exam  Constitutional: She is oriented to  person, place, and time. She appears well-developed and well-nourished.  Neck: Neck supple. No thyromegaly present.  Pulmonary/Chest: Right breast exhibits no inverted nipple, no mass, no nipple discharge, no skin change and no tenderness. Left breast exhibits no inverted nipple, no mass, no nipple discharge, no skin change and no tenderness.  Lymphadenopathy:    She has no cervical adenopathy.    She has no axillary adenopathy.  Neurological: She is alert and oriented to person, place, and time.  Skin: Skin is warm and dry.    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  Lymph node pathology showed reactive hyperplasia without evidence of malignancy.  Assessment    Benign breast exam. No evidence of recurrent lymphadenopathy. Mild mastalgia.     Plan    At  this time I do not have any insight The recent recurrence of her left breast discomfort..     The patient is concerned now that Dr. Dear has left her position as been filled by a PA, and she is uncomfortable with mid-level providers. She'll contact Dr. Ammie Dalton regarding other options for ongoing medical care.   At this time in regards to her breasts, she will continue annual exams with Dr. Ammie Dalton with screening mammograms obtained through his office.     Ref. MD: Dr. Percell Boston, [place] Gleason 05/15/2014, 6:30 PM

## 2014-05-13 NOTE — Patient Instructions (Addendum)
Patient to return as needed. The patient is aware to call back for any questions or concerns. 

## 2014-05-15 DIAGNOSIS — N644 Mastodynia: Secondary | ICD-10-CM | POA: Insufficient documentation

## 2014-07-20 ENCOUNTER — Encounter: Payer: Self-pay | Admitting: General Surgery

## 2014-11-16 ENCOUNTER — Ambulatory Visit (INDEPENDENT_AMBULATORY_CARE_PROVIDER_SITE_OTHER): Payer: PPO | Admitting: General Surgery

## 2014-11-16 ENCOUNTER — Encounter: Payer: Self-pay | Admitting: General Surgery

## 2014-11-16 VITALS — BP 110/70 | HR 70 | Resp 12 | Ht 63.0 in | Wt 193.0 lb

## 2014-11-16 DIAGNOSIS — K625 Hemorrhage of anus and rectum: Secondary | ICD-10-CM | POA: Diagnosis not present

## 2014-11-16 MED ORDER — HYDROCORTISONE ACETATE 25 MG RE SUPP: 25.0000 mg | Freq: Two times a day (BID) | RECTAL | Status: DC

## 2014-11-16 NOTE — Progress Notes (Signed)
Patient ID: [name], female   DOB: [birth year], 44 y.o.   MRN: [id number]  Chief Complaint  Patient presents with  . Follow-up    hemorrhoids    HPI [name] is a 44 y.o. female here today for a evaluation of hemorrhoids. Patient states she has had hemorrhoids for years. She states the last weekend she start bleeding a lot and the bright red blood was in the bowel and paper. She did not notices any blood in her stools. Patient states the pain feel like razor blades cutting . Anusol HC suppositories have failed to produce significant results. She is scheduled for a CT scan of the abdominal and pelvis [facility] at 3:00 tomorrow.  HPI  Past Medical History  Diagnosis Date  . Herpes genitalia   . Lupus   . Depression   . Hyperlipidemia   . Anxiety   . H. pylori infection 2015    Past Surgical History  Procedure Laterality Date  . Tubal ligation  1995  . Neck surgery  1985  . Laparoscopic revision ventricular-peritoneal (v-p) shunt  2000  . Leg surgery Left 2002  . Abdominal hysterectomy  2008  . Posterior laminectomy thoracic and lumbar spine Bilateral 1985    Scoliosis stabilization  . Spine surgery  1985    Scoliosis  . Breast surgery Left 02/04/14    left core bx identifying a fibroadenoma and excision of left axillary lymph node, benign    Family History  Problem Relation Age of Onset  . Leukemia Mother 54  . Lung cancer Father   . Leukemia Maternal Aunt   . Brain cancer Sister   . Colon polyps Mother     Social History History  Substance Use Topics  . Smoking status: Never Smoker   . Smokeless tobacco: Never Used  . Alcohol Use: No    Allergies  Allergen Reactions  . Codeine Swelling  . Penicillins Swelling  . Sulfa Antibiotics Swelling    Current Outpatient Prescriptions  Medication Sig Dispense Refill  . citalopram (CELEXA) 40 MG tablet Take 40 mg by mouth daily.     [name] LORazepam (ATIVAN) 1 MG tablet Take 1 mg by mouth at  bedtime.     . lovastatin (MEVACOR) 20 MG tablet Take 1 tablet by mouth daily.    . montelukast (SINGULAIR) 10 MG tablet Take 1 tablet by mouth daily.    . Multiple Vitamins-Minerals (MULTIVITAMIN WITH MINERALS) tablet Take 1 tablet by mouth daily.    . rizatriptan (MAXALT) 10 MG tablet Take 1 tablet by mouth daily.    [name] topiramate (TOPAMAX) 50 MG tablet TAKE ONE TABLET BY MOUTH 2 TIMES A DAY 60 tablet 0  . triamterene-hydrochlorothiazide (MAXZIDE-25) 37.5-25 MG per tablet Take 1 tablet by mouth daily.    . valACYclovir (VALTREX) 500 MG tablet Take 500 mg by mouth daily.     . hydrocortisone (ANUSOL-HC) 25 MG suppository Place 1 suppository (25 mg total) rectally 2 (two) times daily. 12 suppository 0  . omeprazole (PRILOSEC) 20 MG capsule Take 1 capsule by mouth as needed.     No current facility-administered medications for this visit.    Review of Systems Review of Systems  Constitutional: Negative.   Respiratory: Negative.   Cardiovascular: Negative.   Gastrointestinal: Positive for diarrhea, blood in stool, anal bleeding and rectal pain. Negative for nausea, vomiting, abdominal pain, constipation and abdominal distention.    Blood pressure 110/70, pulse 70, resp. rate 12, height 5\' 3"  (1.6  m), weight 193 lb (87.544 kg).  Physical Exam Physical Exam  Constitutional: She is oriented to person, place, and time. She appears well-developed and well-nourished.  Eyes: Conjunctivae are normal. No scleral icterus.  Neck: Neck supple.  Cardiovascular: Normal rate, regular rhythm and normal heart sounds.   Pulmonary/Chest: Effort normal and breath sounds normal.  Abdominal: Soft. Normal appearance and bowel sounds are normal. There is no tenderness.  Genitourinary: Rectal exam shows external hemorrhoid and fissure.  Lymphadenopathy:    She has no cervical adenopathy.  Neurological: She is alert and oriented to person, place, and time.  Skin: Skin is warm and dry.    Data  Reviewed Anoscopy was completed after the instillation of 5 mL of 2% Xylocaine jelly. This showed evidence of a posterior anal fissure, fairly small in size. Minimal external hemorrhoids with evidence of bleeding at the 10:00 position from a lesion now less than 1 cm in diameter (knee-chest).  Assessment    Anal fissure, external hemorrhoid.    Plan    We will have the patient make use of Anusol HC cream twice a day-3 times a day anticipating complete relief in her symptoms with her next 2 weeks. If she would like to bring the CT scan scheduled for March 1 to the office for review she is welcome to do so.     PCP:  Atilano Median 11/17/2014, 8:13 PM

## 2014-11-17 ENCOUNTER — Encounter: Payer: Self-pay | Admitting: General Surgery

## 2014-11-17 DIAGNOSIS — K625 Hemorrhage of anus and rectum: Secondary | ICD-10-CM | POA: Insufficient documentation

## 2014-11-17 HISTORY — DX: Hemorrhage of anus and rectum: K62.5

## 2015-01-09 NOTE — Op Note (Signed)
PATIENT NAME:  [name], [name] MR#:  [id number] DATE OF BIRTH:  10/07/1970  DATE OF PROCEDURE:  02/04/2014  PREOPERATIVE DIAGNOSIS:  Left axillary adenopathy, left breast pain, left breast nodule.   POSTOPERATIVE DIAGNOSIS:  Left axillary adenopathy, left breast pain, left breast nodule.   OPERATIVE PROCEDURE:  1.  Core biopsy of left breast nodule.  2.  Excision of left axillary lymph node.   OPERATING SURGEON: Hervey Ard.   ANESTHESIA: General by LMA under Dr. Benjamine Mola, Marcaine 0.5% with 1:200,000 units of epinephrine, 30 mL local infiltration.   ESTIMATED BLOOD LOSS:  Minimal.   CLINICAL NOTE:  This 44 year old woman has had pain in the upper outer quadrant of the left breast for the past several months. A course of oral anti-inflammatory agents were without benefit. Assessment showed a small nodular area in the 2 o'clock position, as well as an enlarged left axillary lymph node. She was felt to be a candidate for biopsy.   OPERATIVE NOTE:  With the patient under adequate general anesthesia, the breast was prepped with ChloraPrep and draped. Ultrasound was used to identify the area of lymphadenopathy, and the area was infiltrated with Marcaine. The nodular area in the 2 o'clock position of the left breast, 3 cm from the nipple, was visualized and local anesthesia was infiltrated for postoperative analgesia. A 14-gauge spring-loaded core biopsy device was used and 4 core samples were obtained and sent for routine histology. Scant bleeding was noted. The skin defect was closed with benzoin and Steri-Strips at the end of the procedure.   Attention was turned to the axilla. A transverse incision was made at the lower level of the axillary fold and carried down through the skin and subcutaneous tissue with hemostasis achieved by electrocautery. The enlarged lymph node was excised with cautery and hemostasis achieved with 3-0 Vicryl ties. The wound was closed in layers with 2-0 Vicryl  figure-of-eight sutures to the deep layer and a running 4-0 Vicryl subcuticular suture for the skin. Benzoin, Steri-Strips, Telfa and Tegaderm dressings were applied.   The patient tolerated the procedure well. The lymph node was sent to pathology fresh for processing.     ____________________________ Robert Bellow, MD jwb:dmm D: 02/04/2014 21:36:02 ET T: 02/04/2014 23:00:59 ET JOB#: [id number]  cc: Robert Bellow, MD, <Dictator> Marcie Bal Dear, MD at [place] SIGNED 02/05/2014 9:33

## 2015-03-08 ENCOUNTER — Other Ambulatory Visit: Payer: Self-pay | Admitting: Neurology

## 2015-03-08 DIAGNOSIS — R42 Dizziness and giddiness: Secondary | ICD-10-CM

## 2015-03-10 ENCOUNTER — Ambulatory Visit
Admission: RE | Admit: 2015-03-10 | Discharge: 2015-03-10 | Disposition: A | Discharge status: Home / Self Care | Payer: PPO | Source: Ambulatory Visit | Attending: Neurology | Admitting: Neurology

## 2015-03-10 DIAGNOSIS — Q031 Atresia of foramina of Magendie and Luschka: Secondary | ICD-10-CM | POA: Insufficient documentation

## 2015-03-10 DIAGNOSIS — I517 Cardiomegaly: Secondary | ICD-10-CM | POA: Insufficient documentation

## 2015-03-10 DIAGNOSIS — R42 Dizziness and giddiness: Secondary | ICD-10-CM | POA: Diagnosis not present

## 2015-03-10 DIAGNOSIS — Z95811 Presence of heart assist device: Secondary | ICD-10-CM | POA: Diagnosis not present

## 2015-03-10 DIAGNOSIS — R51 Headache: Secondary | ICD-10-CM | POA: Diagnosis present

## 2015-03-25 ENCOUNTER — Encounter: Payer: Self-pay | Admitting: General Surgery

## 2015-03-29 ENCOUNTER — Encounter: Payer: Self-pay | Admitting: General Surgery

## 2015-03-29 NOTE — Telephone Encounter (Signed)
Patient contacted today and she reports she has not had her yearly mammogram which was to be ordered by Dr. Ammie Dalton. She reports that she was due in January 2016.  According to Dr. Dwyane Luo note from last year, patient had been released back to Dr. Allie Bossier care for breast follow up. Patient encouraged to contact his office to arrange mammogram and appointment. This patient is aware that if she does this and needs further breast follow up that Dr. Allie Bossier office can contact us to schedule an appointment.

## 2015-04-06 ENCOUNTER — Other Ambulatory Visit: Payer: PPO

## 2015-04-06 ENCOUNTER — Ambulatory Visit (INDEPENDENT_AMBULATORY_CARE_PROVIDER_SITE_OTHER): Payer: PPO | Admitting: General Surgery

## 2015-04-06 ENCOUNTER — Encounter: Payer: Self-pay | Admitting: General Surgery

## 2015-04-06 VITALS — BP 132/76 | HR 80 | Resp 12 | Ht 63.0 in | Wt 205.0 lb

## 2015-04-06 DIAGNOSIS — N644 Mastodynia: Secondary | ICD-10-CM

## 2015-04-06 NOTE — Patient Instructions (Addendum)
Continue self breast exams. Call office for any new breast issues or concerns. Recommend 2 aleve twice a day, good fitting bra and even wearing a bra at night may help. Follow up after Mammogram as scheduled next month.

## 2015-04-06 NOTE — Progress Notes (Signed)
Patient ID: [name], female   DOB: Apr 24, 1971, 44 y.o.   MRN: [id number]  Chief Complaint  Patient presents with  . Follow-up    HPI [name] is a 44 y.o. female.  Here today for evaluation of left breast lump that she has felt for about 2 weeks. She states it was tender to touch and felt "full". She has also noticed some left nipple discharge (yellow) for about 2-3 days. She does admit to bilateral nipple itching, left > right. She has been bathing with Cetaphil and states it helps some. Denies any breast injury or trauma. She missed her January mammogram appointment and is scheduled in August. She has been a little dizzy but states her shunt is functioning properly. She has been feeling tired, worse lately. She does admit to weight gain of 10 pounds over the past 1-2 months. She is followed by Neurology at [facility]. They have stopped her Nortriptyline.  MRI of the brain 2-37-62 showed no complications with the shunt. The patient has had fairly diffuse left pain in the past, including May 2015 when she underwent biopsy of a fibroadenoma as well as excision of a significantly enlarged left axillary lymph node. The latter did not show evidence of malignancy.    HPI  Past Medical History  Diagnosis Date  . Herpes genitalia   . Lupus   . Depression   . Hyperlipidemia   . Anxiety   . H. pylori infection 2015    Past Surgical History  Procedure Laterality Date  . Tubal ligation  1995  . Neck surgery  1985  . Laparoscopic revision ventricular-peritoneal (v-p) shunt  2000  . Leg surgery Left 2002  . Abdominal hysterectomy  2008  . Posterior laminectomy thoracic and lumbar spine Bilateral 1985    Scoliosis stabilization  . Spine surgery  1985    Scoliosis  . Breast surgery Left 02/04/14    left core bx identifying a fibroadenoma and excision of left axillary lymph node, benign    Family History  Problem Relation Age of Onset  . Leukemia Mother 67  . Lung  cancer Father   . Leukemia Maternal Aunt   . Brain cancer Sister   . Colon polyps Mother     Social History History  Substance Use Topics  . Smoking status: Never Smoker   . Smokeless tobacco: Never Used  . Alcohol Use: No    Allergies  Allergen Reactions  . Sumatriptan Rash  . Codeine Swelling  . Penicillins Swelling  . Sulfa Antibiotics Swelling    Current Outpatient Prescriptions  Medication Sig Dispense Refill  . citalopram (CELEXA) 40 MG tablet Take 40 mg by mouth daily.     . hydrocortisone (ANUSOL-HC) 25 MG suppository Place 1 suppository (25 mg total) rectally 2 (two) times daily. (Patient taking differently: Place 25 mg rectally 2 (two) times daily as needed. ) 12 suppository 0  . LORazepam (ATIVAN) 1 MG tablet Take 1 mg by mouth at bedtime.     . lovastatin (MEVACOR) 20 MG tablet Take 1 tablet by mouth daily.    . montelukast (SINGULAIR) 10 MG tablet Take 1 tablet by mouth daily.    . rizatriptan (MAXALT) 10 MG tablet Take 1 tablet by mouth daily.    [name] topiramate (TOPAMAX) 50 MG tablet TAKE ONE TABLET BY MOUTH 2 TIMES A DAY 60 tablet 0  . triamterene-hydrochlorothiazide (MAXZIDE-25) 37.5-25 MG per tablet Take 1 tablet by mouth daily.    . valACYclovir (  VALTREX) 500 MG tablet Take 500 mg by mouth daily.      No current facility-administered medications for this visit.    Review of Systems Review of Systems  Constitutional: Negative.   Respiratory: Negative.   Cardiovascular: Negative.     Blood pressure 132/76, pulse 80, resp. rate 12, height 5\' 3"  (1.6 m), weight 205 lb (92.987 kg).  Physical Exam Physical Exam  Constitutional: She is oriented to person, place, and time. She appears well-developed and well-nourished.  HENT:  Mouth/Throat: Oropharynx is clear and moist.  Eyes: Conjunctivae are normal. No scleral icterus.  Neck: Neck supple.  Cardiovascular: Normal rate, regular rhythm and normal heart sounds.   Pulmonary/Chest: Effort normal and breath  sounds normal. Right breast exhibits tenderness. Right breast exhibits no inverted nipple, no mass, no nipple discharge and no skin change. Left breast exhibits tenderness. Left breast exhibits no inverted nipple, no mass, no nipple discharge and no skin change.  Tenderness right lateral breast. Tenderness throughout left breast as well as over left pectoralis.   Lymphadenopathy:    She has no cervical adenopathy.  Neurological: She is alert and oriented to person, place, and time.  Skin: Skin is warm and dry.  Psychiatric: She has a normal mood and affect.    Data Reviewed Ultrasound examination of the left breast at the 9:00 position, 1 cm nipple showed a prominent duct measuring up to 0.66 cm. No intraductal lesion was noted.  At the 2:00 position, 2 cm from the nipple a well-defined smoothly marginated 0.6 x 0.6 x 0.8 cm hypoechoic nodule with modest posterior acoustic enhancement and good edge effect was appreciated. 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 At the 2:00 position 4 cm from the nipple a well-defined structure consistent with a lymph node measuring up to 0.6 x 0.9 x 1.06 cm is appreciated. The cyst slightly larger than past exams but the echo pattern is unchanged.  At the left breast in the 2:00 position, 6 cm from the nipple multiple small cysts are appreciated with the largest measuring 0.36 cm in diameter and in aggregate measuring less than 0.7 cm in diameter. Examination of the axilla was negative. BI-RADS-2.  Assessment    Left breast pain without clear etiology.    Plan    Significant weight gain may have made her undergarments improperly fitted.     Recommend 2 aleve twice a day, good fitting bra and even wearing a bra at night may help. Follow up after Mammogram as scheduled next month. This has been scheduled at [facility]. She's been asked to bring a copy of her disc for review.  PCP:  Atilano Median 04/06/2015, 9:41  PM

## 2015-05-04 ENCOUNTER — Ambulatory Visit (INDEPENDENT_AMBULATORY_CARE_PROVIDER_SITE_OTHER): Payer: PPO | Admitting: General Surgery

## 2015-05-04 ENCOUNTER — Encounter: Payer: Self-pay | Admitting: General Surgery

## 2015-05-04 VITALS — BP 126/68 | HR 68 | Resp 16 | Ht 63.0 in | Wt 201.0 lb

## 2015-05-04 DIAGNOSIS — N63 Unspecified lump in unspecified breast: Secondary | ICD-10-CM

## 2015-05-04 NOTE — Patient Instructions (Signed)
Continue with self breast checks and get regular mammograms. Call the office with any concerns.

## 2015-05-04 NOTE — Progress Notes (Signed)
Patient ID: [name], female   DOB: Jul 27, 1971, 44 y.o.   MRN: [id number]  Chief Complaint  Patient presents with  . Follow-up    left breast mass and pain    HPI [name] is a 44 y.o. female here following up from  her mammogram done at [facility] on 04/21/15. She states she does have dense tissue in her left breast near area of previous biopsy which causes her pain.  The patient has purchased new undergarments with a marked improvement in her baseline breast discomfort. She still has some occasional discomfort in the upper-outer quadrant of the left breast with direct pressure.  HPI  Past Medical History  Diagnosis Date  . Herpes genitalia   . Lupus   . Depression   . Hyperlipidemia   . Anxiety   . H. pylori infection 2015    Past Surgical History  Procedure Laterality Date  . Tubal ligation  1995  . Neck surgery  1985  . Laparoscopic revision ventricular-peritoneal (v-p) shunt  2000  . Leg surgery Left 2002  . Abdominal hysterectomy  2008  . Posterior laminectomy thoracic and lumbar spine Bilateral 1985    Scoliosis stabilization  . Spine surgery  1985    Scoliosis  . Breast surgery Left 02/04/14    left core bx identifying a fibroadenoma and excision of left axillary lymph node, benign    Family History  Problem Relation Age of Onset  . Leukemia Mother 32  . Lung cancer Father   . Leukemia Maternal Aunt   . Brain cancer Sister   . Colon polyps Mother     Social History Social History  Substance Use Topics  . Smoking status: Never Smoker   . Smokeless tobacco: Never Used  . Alcohol Use: No    Allergies  Allergen Reactions  . Sumatriptan Rash  . Codeine Swelling  . Penicillins Swelling  . Sulfa Antibiotics Swelling    Current Outpatient Prescriptions  Medication Sig Dispense Refill  . citalopram (CELEXA) 40 MG tablet Take 40 mg by mouth daily.     [name] LORazepam (ATIVAN) 1 MG tablet Take 1 mg by mouth at bedtime.     . lovastatin (MEVACOR) 20  MG tablet Take 1 tablet by mouth daily.    . montelukast (SINGULAIR) 10 MG tablet Take 1 tablet by mouth daily.    . rizatriptan (MAXALT) 10 MG tablet Take 1 tablet by mouth daily.    [name] topiramate (TOPAMAX) 50 MG tablet TAKE ONE TABLET BY MOUTH 2 TIMES A DAY 60 tablet 0  . triamterene-hydrochlorothiazide (MAXZIDE-25) 37.5-25 MG per tablet Take 1 tablet by mouth daily.    . valACYclovir (VALTREX) 500 MG tablet Take 500 mg by mouth daily.      No current facility-administered medications for this visit.    Review of Systems Review of Systems  Constitutional: Negative.   Respiratory: Negative.   Cardiovascular: Negative.     Blood pressure 126/68, pulse 68, resp. rate 16, height 5\' 3"  (1.6 m), weight 201 lb (91.173 kg).  Physical Exam Physical Exam  Constitutional: She is oriented to person, place, and time. She appears well-developed and well-nourished.  Eyes: Conjunctivae are normal. No scleral icterus.  Neck: Neck supple. No thyromegaly present.  Cardiovascular: Normal rate, regular rhythm and normal heart sounds.   Pulmonary/Chest: Effort normal and breath sounds normal. Right breast exhibits no inverted nipple, no mass, no nipple discharge, no skin change and no tenderness. Left breast exhibits tenderness. Left breast  exhibits no inverted nipple, no mass, no nipple discharge and no skin change.  Lymphadenopathy:    She has no cervical adenopathy.    She has no axillary adenopathy.  Neurological: She is alert and oriented to person, place, and time.  Skin: Skin is warm and dry.  Psychiatric: Her behavior is normal.    Data Reviewed Bilateral mammograms completed at [facility] were reviewed and compared to 2015 studies. No interval change. BI-RADS-2.  Assessment    Benign breast exam.    Plan    The previously identified lesions in the left breast of all been shown by biopsy or FNA to be benign. Annual mammography with her GYN and monthly self examinations are  recommended. She is welcome to return if any new issues develop.     Call the office with any concerns. Continue self breast checks and get regular mammograms done with PCP.  PCP: Atilano Median 05/04/2015, 6:31 PM

## 2015-06-29 DIAGNOSIS — E559 Vitamin D deficiency, unspecified: Secondary | ICD-10-CM

## 2015-06-29 HISTORY — DX: Vitamin D deficiency, unspecified: E55.9

## 2015-08-28 DIAGNOSIS — R635 Abnormal weight gain: Secondary | ICD-10-CM | POA: Insufficient documentation

## 2015-08-28 DIAGNOSIS — R1084 Generalized abdominal pain: Secondary | ICD-10-CM | POA: Insufficient documentation

## 2015-08-28 DIAGNOSIS — I6203 Nontraumatic chronic subdural hemorrhage: Secondary | ICD-10-CM

## 2015-08-28 HISTORY — DX: Generalized abdominal pain: R10.84

## 2015-08-28 HISTORY — DX: Abnormal weight gain: R63.5

## 2015-08-28 HISTORY — DX: Nontraumatic chronic subdural hemorrhage: I62.03

## 2015-08-29 DIAGNOSIS — J019 Acute sinusitis, unspecified: Secondary | ICD-10-CM | POA: Insufficient documentation

## 2015-08-29 DIAGNOSIS — G919 Hydrocephalus, unspecified: Secondary | ICD-10-CM | POA: Insufficient documentation

## 2015-08-29 DIAGNOSIS — Q031 Atresia of foramina of Magendie and Luschka: Secondary | ICD-10-CM | POA: Insufficient documentation

## 2015-08-29 HISTORY — DX: Atresia of foramina of Magendie and Luschka: Q03.1

## 2015-08-29 HISTORY — DX: Acute sinusitis, unspecified: J01.90

## 2015-08-29 HISTORY — DX: Hydrocephalus, unspecified: G91.9

## 2015-09-15 DIAGNOSIS — Z8781 Personal history of (healed) traumatic fracture: Secondary | ICD-10-CM

## 2015-09-15 HISTORY — DX: Personal history of (healed) traumatic fracture: Z87.81

## 2015-09-21 DIAGNOSIS — R079 Chest pain, unspecified: Secondary | ICD-10-CM | POA: Diagnosis not present

## 2015-09-22 DIAGNOSIS — R1310 Dysphagia, unspecified: Secondary | ICD-10-CM | POA: Diagnosis not present

## 2015-09-22 DIAGNOSIS — R1010 Upper abdominal pain, unspecified: Secondary | ICD-10-CM | POA: Diagnosis not present

## 2015-09-29 DIAGNOSIS — S4991XA Unspecified injury of right shoulder and upper arm, initial encounter: Secondary | ICD-10-CM | POA: Diagnosis not present

## 2015-09-29 DIAGNOSIS — S6991XA Unspecified injury of right wrist, hand and finger(s), initial encounter: Secondary | ICD-10-CM | POA: Diagnosis not present

## 2015-09-29 DIAGNOSIS — Z982 Presence of cerebrospinal fluid drainage device: Secondary | ICD-10-CM | POA: Diagnosis not present

## 2015-09-29 DIAGNOSIS — S0990XA Unspecified injury of head, initial encounter: Secondary | ICD-10-CM | POA: Diagnosis not present

## 2015-09-29 DIAGNOSIS — R51 Headache: Secondary | ICD-10-CM | POA: Diagnosis not present

## 2015-09-29 DIAGNOSIS — M79631 Pain in right forearm: Secondary | ICD-10-CM | POA: Diagnosis not present

## 2015-09-29 DIAGNOSIS — Y9389 Activity, other specified: Secondary | ICD-10-CM | POA: Diagnosis not present

## 2015-09-29 DIAGNOSIS — M25511 Pain in right shoulder: Secondary | ICD-10-CM | POA: Diagnosis not present

## 2015-09-29 DIAGNOSIS — T148 Other injury of unspecified body region: Secondary | ICD-10-CM | POA: Diagnosis not present

## 2015-09-29 DIAGNOSIS — S060X0A Concussion without loss of consciousness, initial encounter: Secondary | ICD-10-CM | POA: Diagnosis not present

## 2015-09-29 DIAGNOSIS — S065X0A Traumatic subdural hemorrhage without loss of consciousness, initial encounter: Secondary | ICD-10-CM | POA: Diagnosis not present

## 2015-09-29 DIAGNOSIS — Q031 Atresia of foramina of Magendie and Luschka: Secondary | ICD-10-CM | POA: Diagnosis not present

## 2015-09-29 DIAGNOSIS — M79601 Pain in right arm: Secondary | ICD-10-CM | POA: Diagnosis not present

## 2015-09-29 DIAGNOSIS — S5001XA Contusion of right elbow, initial encounter: Secondary | ICD-10-CM | POA: Diagnosis not present

## 2015-10-05 DIAGNOSIS — S161XXD Strain of muscle, fascia and tendon at neck level, subsequent encounter: Secondary | ICD-10-CM | POA: Diagnosis not present

## 2015-10-05 DIAGNOSIS — S40011D Contusion of right shoulder, subsequent encounter: Secondary | ICD-10-CM | POA: Diagnosis not present

## 2015-10-05 DIAGNOSIS — S40021D Contusion of right upper arm, subsequent encounter: Secondary | ICD-10-CM | POA: Diagnosis not present

## 2015-10-05 DIAGNOSIS — S2231XA Fracture of one rib, right side, initial encounter for closed fracture: Secondary | ICD-10-CM | POA: Diagnosis not present

## 2015-10-05 DIAGNOSIS — J9811 Atelectasis: Secondary | ICD-10-CM | POA: Diagnosis not present

## 2015-10-05 DIAGNOSIS — S2231XD Fracture of one rib, right side, subsequent encounter for fracture with routine healing: Secondary | ICD-10-CM | POA: Diagnosis not present

## 2015-10-05 DIAGNOSIS — S199XXA Unspecified injury of neck, initial encounter: Secondary | ICD-10-CM | POA: Diagnosis not present

## 2015-10-05 DIAGNOSIS — M542 Cervicalgia: Secondary | ICD-10-CM | POA: Diagnosis not present

## 2015-10-05 DIAGNOSIS — W19XXXA Unspecified fall, initial encounter: Secondary | ICD-10-CM | POA: Diagnosis not present

## 2015-10-05 DIAGNOSIS — S20211D Contusion of right front wall of thorax, subsequent encounter: Secondary | ICD-10-CM | POA: Diagnosis not present

## 2015-10-05 DIAGNOSIS — M5412 Radiculopathy, cervical region: Secondary | ICD-10-CM | POA: Diagnosis not present

## 2015-10-06 DIAGNOSIS — M5412 Radiculopathy, cervical region: Secondary | ICD-10-CM | POA: Diagnosis not present

## 2015-10-06 DIAGNOSIS — M542 Cervicalgia: Secondary | ICD-10-CM | POA: Diagnosis not present

## 2015-10-06 DIAGNOSIS — M4722 Other spondylosis with radiculopathy, cervical region: Secondary | ICD-10-CM | POA: Diagnosis not present

## 2015-10-06 DIAGNOSIS — Z981 Arthrodesis status: Secondary | ICD-10-CM | POA: Diagnosis not present

## 2015-10-12 DIAGNOSIS — M5412 Radiculopathy, cervical region: Secondary | ICD-10-CM | POA: Diagnosis not present

## 2015-10-12 DIAGNOSIS — S2231XD Fracture of one rib, right side, subsequent encounter for fracture with routine healing: Secondary | ICD-10-CM | POA: Diagnosis not present

## 2015-10-12 DIAGNOSIS — S40021D Contusion of right upper arm, subsequent encounter: Secondary | ICD-10-CM | POA: Diagnosis not present

## 2015-10-12 DIAGNOSIS — S40011D Contusion of right shoulder, subsequent encounter: Secondary | ICD-10-CM | POA: Diagnosis not present

## 2015-10-12 DIAGNOSIS — E782 Mixed hyperlipidemia: Secondary | ICD-10-CM | POA: Diagnosis not present

## 2015-10-12 DIAGNOSIS — M25511 Pain in right shoulder: Secondary | ICD-10-CM | POA: Diagnosis not present

## 2015-10-12 DIAGNOSIS — S161XXD Strain of muscle, fascia and tendon at neck level, subsequent encounter: Secondary | ICD-10-CM | POA: Diagnosis not present

## 2015-10-12 DIAGNOSIS — Z131 Encounter for screening for diabetes mellitus: Secondary | ICD-10-CM | POA: Diagnosis not present

## 2015-10-12 DIAGNOSIS — S20211D Contusion of right front wall of thorax, subsequent encounter: Secondary | ICD-10-CM | POA: Diagnosis not present

## 2015-10-18 DIAGNOSIS — F339 Major depressive disorder, recurrent, unspecified: Secondary | ICD-10-CM | POA: Diagnosis not present

## 2015-10-25 DIAGNOSIS — J019 Acute sinusitis, unspecified: Secondary | ICD-10-CM | POA: Diagnosis not present

## 2015-10-25 DIAGNOSIS — I10 Essential (primary) hypertension: Secondary | ICD-10-CM

## 2015-10-25 DIAGNOSIS — H9201 Otalgia, right ear: Secondary | ICD-10-CM | POA: Diagnosis not present

## 2015-10-25 HISTORY — DX: Essential (primary) hypertension: I10

## 2015-10-27 DIAGNOSIS — F411 Generalized anxiety disorder: Secondary | ICD-10-CM

## 2015-10-27 DIAGNOSIS — F3340 Major depressive disorder, recurrent, in remission, unspecified: Secondary | ICD-10-CM

## 2015-10-27 HISTORY — DX: Generalized anxiety disorder: F41.1

## 2015-10-27 HISTORY — DX: Major depressive disorder, recurrent, in remission, unspecified: F33.40

## 2015-10-28 DIAGNOSIS — F3340 Major depressive disorder, recurrent, in remission, unspecified: Secondary | ICD-10-CM | POA: Diagnosis not present

## 2015-10-29 DIAGNOSIS — R102 Pelvic and perineal pain: Secondary | ICD-10-CM | POA: Diagnosis not present

## 2015-10-29 DIAGNOSIS — R922 Inconclusive mammogram: Secondary | ICD-10-CM | POA: Diagnosis not present

## 2015-10-29 DIAGNOSIS — R32 Unspecified urinary incontinence: Secondary | ICD-10-CM | POA: Diagnosis not present

## 2015-10-29 DIAGNOSIS — N644 Mastodynia: Secondary | ICD-10-CM | POA: Diagnosis not present

## 2015-11-03 DIAGNOSIS — R569 Unspecified convulsions: Secondary | ICD-10-CM | POA: Diagnosis not present

## 2015-11-03 DIAGNOSIS — R51 Headache: Secondary | ICD-10-CM | POA: Diagnosis not present

## 2015-11-03 DIAGNOSIS — M542 Cervicalgia: Secondary | ICD-10-CM | POA: Diagnosis not present

## 2015-11-04 ENCOUNTER — Ambulatory Visit (INDEPENDENT_AMBULATORY_CARE_PROVIDER_SITE_OTHER): Payer: PPO | Admitting: General Surgery

## 2015-11-04 ENCOUNTER — Telehealth: Payer: Self-pay | Admitting: *Deleted

## 2015-11-04 ENCOUNTER — Encounter: Payer: Self-pay | Admitting: General Surgery

## 2015-11-04 ENCOUNTER — Other Ambulatory Visit: Payer: Self-pay

## 2015-11-04 VITALS — BP 122/82 | HR 84 | Resp 12 | Ht 63.0 in | Wt 216.0 lb

## 2015-11-04 DIAGNOSIS — R1010 Upper abdominal pain, unspecified: Secondary | ICD-10-CM

## 2015-11-04 DIAGNOSIS — G8929 Other chronic pain: Secondary | ICD-10-CM | POA: Diagnosis not present

## 2015-11-04 DIAGNOSIS — N644 Mastodynia: Secondary | ICD-10-CM

## 2015-11-04 DIAGNOSIS — R1011 Right upper quadrant pain: Secondary | ICD-10-CM

## 2015-11-04 NOTE — Telephone Encounter (Signed)
Message left at the [facility] (Phone: [phone]) to see if we need to fax record request to them or to their radiology department.   Dr. Bary Castilla wants a copy of ultrasound and HIDA scan report as well as a disc from 2016 mailed to our office.   We need to get the fax number of where we need to fax record request.

## 2015-11-04 NOTE — Patient Instructions (Signed)
The patient is aware to call back for any questions or concerns.  
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
## 2015-11-05 DIAGNOSIS — G8929 Other chronic pain: Secondary | ICD-10-CM | POA: Insufficient documentation

## 2015-11-05 DIAGNOSIS — R1011 Right upper quadrant pain: Secondary | ICD-10-CM

## 2015-11-05 HISTORY — DX: Right upper quadrant pain: R10.11

## 2015-11-05 NOTE — Telephone Encounter (Signed)
Tamera Stands from the medical record department at [facility] called to give me the details and fax number to send the request. I faxed the request at 8:30am on 11/05/15 and she is going to fax over the reports as well as mail the disc to [place].

## 2015-11-10 DIAGNOSIS — R002 Palpitations: Secondary | ICD-10-CM | POA: Diagnosis not present

## 2015-11-10 DIAGNOSIS — F418 Other specified anxiety disorders: Secondary | ICD-10-CM | POA: Diagnosis not present

## 2015-11-25 DIAGNOSIS — G43909 Migraine, unspecified, not intractable, without status migrainosus: Secondary | ICD-10-CM

## 2015-11-25 DIAGNOSIS — J309 Allergic rhinitis, unspecified: Secondary | ICD-10-CM

## 2015-11-25 DIAGNOSIS — F3340 Major depressive disorder, recurrent, in remission, unspecified: Secondary | ICD-10-CM | POA: Diagnosis not present

## 2015-11-25 DIAGNOSIS — M2391 Unspecified internal derangement of right knee: Secondary | ICD-10-CM | POA: Insufficient documentation

## 2015-11-25 DIAGNOSIS — G43709 Chronic migraine without aura, not intractable, without status migrainosus: Secondary | ICD-10-CM | POA: Insufficient documentation

## 2015-11-25 DIAGNOSIS — IMO0002 Reserved for concepts with insufficient information to code with codable children: Secondary | ICD-10-CM

## 2015-11-25 HISTORY — DX: Unspecified internal derangement of right knee: M23.91

## 2015-11-25 HISTORY — DX: Reserved for concepts with insufficient information to code with codable children: IMO0002

## 2015-11-25 HISTORY — DX: Migraine, unspecified, not intractable, without status migrainosus: G43.909

## 2015-11-25 HISTORY — DX: Allergic rhinitis, unspecified: J30.9

## 2015-12-02 ENCOUNTER — Encounter: Payer: Self-pay | Admitting: General Surgery

## 2015-12-02 ENCOUNTER — Telehealth: Payer: Self-pay | Admitting: *Deleted

## 2015-12-02 DIAGNOSIS — G8929 Other chronic pain: Secondary | ICD-10-CM

## 2015-12-02 DIAGNOSIS — Z111 Encounter for screening for respiratory tuberculosis: Secondary | ICD-10-CM | POA: Diagnosis not present

## 2015-12-02 DIAGNOSIS — M2550 Pain in unspecified joint: Secondary | ICD-10-CM

## 2015-12-02 DIAGNOSIS — R05 Cough: Secondary | ICD-10-CM | POA: Diagnosis not present

## 2015-12-02 HISTORY — DX: Other chronic pain: G89.29

## 2015-12-02 NOTE — Progress Notes (Unsigned)
The patient had reported intermittent right upper quadrant pain with fatty foods at her last visit. Since that time the ultrasound and HIDA scan completed at [facility] from December 2016 has been reviewed. The ultrasound was unremarkable. The HIDA scan showed 92% ejection fraction. The patient will be contacted to determine if she was symptomatic during the CCK injection. If so, we will discuss with her the possibility of elective cholecystectomy.

## 2015-12-02 NOTE — Telephone Encounter (Signed)
-----   Message from Robert Bellow, MD sent at 12/02/2015  8:56 AM EDT ----- Please notify the patient that I reviewed her ultrasound and HIDA scan from [facility] See if she recalls having pain during the HIDA scan, if so, we can discuss at f/u whether gallbladder removal would be a possibility. If not, no indication for gallbladder surgery. Thanks.

## 2015-12-02 NOTE — Telephone Encounter (Signed)
Patient states she didn't have any pain or discomfort when having her HIDA scan done. Patient to call the office with any new problems or issues.

## 2015-12-03 NOTE — Telephone Encounter (Signed)
High ejection without symptoms. Unlikely biliary source for pain. No indication for cholecystectomy.

## 2015-12-21 HISTORY — PX: COLONOSCOPY: SHX174

## 2015-12-22 DIAGNOSIS — R05 Cough: Secondary | ICD-10-CM | POA: Diagnosis not present

## 2015-12-22 DIAGNOSIS — J209 Acute bronchitis, unspecified: Secondary | ICD-10-CM | POA: Diagnosis not present

## 2015-12-22 DIAGNOSIS — J111 Influenza due to unidentified influenza virus with other respiratory manifestations: Secondary | ICD-10-CM | POA: Diagnosis not present

## 2015-12-22 DIAGNOSIS — R0602 Shortness of breath: Secondary | ICD-10-CM | POA: Diagnosis not present

## 2015-12-30 ENCOUNTER — Ambulatory Visit (INDEPENDENT_AMBULATORY_CARE_PROVIDER_SITE_OTHER): Payer: PPO | Admitting: Allergy and Immunology

## 2015-12-30 ENCOUNTER — Encounter: Payer: Self-pay | Admitting: Allergy and Immunology

## 2015-12-30 VITALS — BP 110/74 | HR 72 | Temp 98.3°F | Resp 16 | Ht 64.0 in | Wt 213.8 lb

## 2015-12-30 DIAGNOSIS — K219 Gastro-esophageal reflux disease without esophagitis: Secondary | ICD-10-CM

## 2015-12-30 DIAGNOSIS — J309 Allergic rhinitis, unspecified: Secondary | ICD-10-CM | POA: Diagnosis not present

## 2015-12-30 DIAGNOSIS — J4531 Mild persistent asthma with (acute) exacerbation: Secondary | ICD-10-CM

## 2015-12-30 DIAGNOSIS — J387 Other diseases of larynx: Secondary | ICD-10-CM

## 2015-12-30 DIAGNOSIS — G43109 Migraine with aura, not intractable, without status migrainosus: Secondary | ICD-10-CM

## 2015-12-30 DIAGNOSIS — H1010 Acute atopic conjunctivitis, unspecified eye: Secondary | ICD-10-CM | POA: Diagnosis not present

## 2015-12-30 DIAGNOSIS — Z8349 Family history of other endocrine, nutritional and metabolic diseases: Secondary | ICD-10-CM | POA: Diagnosis not present

## 2015-12-30 DIAGNOSIS — G43909 Migraine, unspecified, not intractable, without status migrainosus: Secondary | ICD-10-CM

## 2015-12-30 MED ORDER — MONTELUKAST SODIUM 10 MG PO TABS: 10.0000 mg | ORAL_TABLET | Freq: Every day | ORAL | Status: DC

## 2015-12-30 MED ORDER — FLUTICASONE FUROATE 200 MCG/ACT IN AEPB: 1.0000 | INHALATION_SPRAY | Freq: Every day | RESPIRATORY_TRACT | Status: DC

## 2015-12-30 MED ORDER — RANITIDINE HCL 300 MG PO TABS: 300.0000 mg | ORAL_TABLET | Freq: Every day | ORAL | Status: DC

## 2015-12-30 MED ORDER — LOSARTAN POTASSIUM-HCTZ 50-12.5 MG PO TABS: 1.0000 | ORAL_TABLET | Freq: Every day | ORAL | Status: DC

## 2015-12-30 MED ORDER — PANTOPRAZOLE SODIUM 40 MG PO TBEC: 40.0000 mg | DELAYED_RELEASE_TABLET | Freq: Every day | ORAL | Status: DC

## 2015-12-30 MED ORDER — ALBUTEROL SULFATE HFA 108 (90 BASE) MCG/ACT IN AERS: 2.0000 | INHALATION_SPRAY | RESPIRATORY_TRACT | Status: DC | PRN
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
## 2015-12-30 NOTE — Patient Instructions (Addendum)
  1. Allergen avoidance measures  2. Treat and prevent reflux:   A. slowly taper off all forms of caffeine and chocolate  B. Pantoprazole 40 mg one tablet in morning  C. ranitidine 300 mg one tablet in evening  3. Treat and prevent inflammation:   A. Arnuity 200 one inhalation 1 time per day  B. OTC Rhinocort one spray each nostril one time per day  C. montelukast 10 mg one tablet once a day  4. If needed:   A. nasal saline wash  B. ProAir HFA 2 puffs every 4-6 hours  C. OTC antihistamine - Claritin/Allegra/Zyrtec  one time per day  5. Review previous head CT scan, chest CT scan, chest x-ray  6. Obtain limited coronal sinus CT scan for sinusitis and Phantosmia  7. Change lisinopril/HCT to losartan 50 mg one time per day plus hydrochlorothiazide 12.5 mg one time per day  8. Blood - IgA/G/M, CBC with differential, alpha 1 antitrypsin level and phenotype  8. Return to clinic in 3 weeks or earlier if problem

## 2016-01-03 ENCOUNTER — Encounter: Payer: Self-pay | Admitting: *Deleted

## 2016-01-04 DIAGNOSIS — G9389 Other specified disorders of brain: Secondary | ICD-10-CM | POA: Diagnosis not present

## 2016-01-04 DIAGNOSIS — J45909 Unspecified asthma, uncomplicated: Secondary | ICD-10-CM | POA: Insufficient documentation

## 2016-01-04 DIAGNOSIS — I10 Essential (primary) hypertension: Secondary | ICD-10-CM | POA: Diagnosis not present

## 2016-01-04 DIAGNOSIS — R768 Other specified abnormal immunological findings in serum: Secondary | ICD-10-CM

## 2016-01-04 DIAGNOSIS — M359 Systemic involvement of connective tissue, unspecified: Secondary | ICD-10-CM

## 2016-01-04 DIAGNOSIS — R05 Cough: Secondary | ICD-10-CM | POA: Diagnosis not present

## 2016-01-04 DIAGNOSIS — R430 Anosmia: Secondary | ICD-10-CM | POA: Diagnosis not present

## 2016-01-04 DIAGNOSIS — R7689 Other specified abnormal immunological findings in serum: Secondary | ICD-10-CM | POA: Insufficient documentation

## 2016-01-04 DIAGNOSIS — F3340 Major depressive disorder, recurrent, in remission, unspecified: Secondary | ICD-10-CM | POA: Diagnosis not present

## 2016-01-04 DIAGNOSIS — J329 Chronic sinusitis, unspecified: Secondary | ICD-10-CM | POA: Diagnosis not present

## 2016-01-04 DIAGNOSIS — J4540 Moderate persistent asthma, uncomplicated: Secondary | ICD-10-CM | POA: Diagnosis not present

## 2016-01-04 HISTORY — DX: Systemic involvement of connective tissue, unspecified: M35.9

## 2016-01-04 HISTORY — DX: Other specified abnormal immunological findings in serum: R76.8

## 2016-01-04 LAB — IGG, IGA, IGM
IGG (IMMUNOGLOBIN G), SERUM: 1137 mg/dL (ref 700–1600)
IGM (IMMUNOGLOBULIN M), SRM: 169 mg/dL (ref 26–217)
IgA/Immunoglobulin A, Serum: 102 mg/dL (ref 87–352)

## 2016-01-04 LAB — CBC WITH DIFFERENTIAL/PLATELET
BASOS: 2 %
Basophils Absolute: 0.3 10*3/uL — ABNORMAL HIGH (ref 0.0–0.2)
EOS (ABSOLUTE): 0.4 10*3/uL (ref 0.0–0.4)
Eos: 3 %
HEMOGLOBIN: 13.9 g/dL (ref 11.1–15.9)
Hematocrit: 41.1 % (ref 34.0–46.6)
LYMPHS ABS: 4.8 10*3/uL — AB (ref 0.7–3.1)
LYMPHS: 38 %
MCH: 30.2 pg (ref 26.6–33.0)
MCHC: 33.8 g/dL (ref 31.5–35.7)
MCV: 89 fL (ref 79–97)
MONOS ABS: 0.3 10*3/uL (ref 0.1–0.9)
Monocytes: 2 %
NEUTROS PCT: 55 %
Neutrophils Absolute: 6.9 10*3/uL (ref 1.4–7.0)
Platelets: 321 10*3/uL (ref 150–379)
RBC: 4.61 x10E6/uL (ref 3.77–5.28)
RDW: 15.6 % — ABNORMAL HIGH (ref 12.3–15.4)
WBC: 12.6 10*3/uL — ABNORMAL HIGH (ref 3.4–10.8)

## 2016-01-04 LAB — ALPHA-1 ANTITRYPSIN PHENOTYPE: A1 ANTITRYPSIN: 117 mg/dL (ref 90–200)

## 2016-01-06 ENCOUNTER — Encounter: Payer: Self-pay | Admitting: Allergy and Immunology

## 2016-01-11 DIAGNOSIS — M797 Fibromyalgia: Secondary | ICD-10-CM | POA: Diagnosis not present

## 2016-01-11 DIAGNOSIS — M170 Bilateral primary osteoarthritis of knee: Secondary | ICD-10-CM | POA: Insufficient documentation

## 2016-01-11 DIAGNOSIS — G894 Chronic pain syndrome: Secondary | ICD-10-CM

## 2016-01-11 HISTORY — DX: Bilateral primary osteoarthritis of knee: M17.0

## 2016-01-11 HISTORY — DX: Chronic pain syndrome: G89.4

## 2016-01-20 ENCOUNTER — Encounter: Payer: Self-pay | Admitting: Allergy and Immunology

## 2016-01-20 ENCOUNTER — Ambulatory Visit (INDEPENDENT_AMBULATORY_CARE_PROVIDER_SITE_OTHER): Payer: PPO | Admitting: Allergy and Immunology

## 2016-01-20 VITALS — BP 110/76 | HR 80 | Resp 20

## 2016-01-20 DIAGNOSIS — H1010 Acute atopic conjunctivitis, unspecified eye: Secondary | ICD-10-CM

## 2016-01-20 DIAGNOSIS — J309 Allergic rhinitis, unspecified: Secondary | ICD-10-CM

## 2016-01-20 DIAGNOSIS — G43109 Migraine with aura, not intractable, without status migrainosus: Secondary | ICD-10-CM

## 2016-01-20 DIAGNOSIS — D72820 Lymphocytosis (symptomatic): Secondary | ICD-10-CM | POA: Diagnosis not present

## 2016-01-20 DIAGNOSIS — J387 Other diseases of larynx: Secondary | ICD-10-CM | POA: Diagnosis not present

## 2016-01-20 DIAGNOSIS — K219 Gastro-esophageal reflux disease without esophagitis: Secondary | ICD-10-CM

## 2016-01-20 DIAGNOSIS — G43909 Migraine, unspecified, not intractable, without status migrainosus: Secondary | ICD-10-CM

## 2016-01-20 DIAGNOSIS — I10 Essential (primary) hypertension: Secondary | ICD-10-CM | POA: Diagnosis not present

## 2016-01-20 DIAGNOSIS — J4530 Mild persistent asthma, uncomplicated: Secondary | ICD-10-CM

## 2016-01-20 NOTE — Progress Notes (Signed)
Follow-up Note  Referring Provider: Algis Greenhouse, MD Primary Provider: Teressa Lower, MD Date of Office Visit: 01/20/2016  Subjective:   [name] (DOB: [birth year]) is a 45 y.o. female who returns to the [facility] [place] on 01/20/2016 in re-evaluation of the following:  HPI: [name] returns to this clinic in evaluation of her cough, throat problems, and headaches, believed to be secondary to asthma, allergic rhinoconjunctivitis, migraines, and reflux.  Since starting medical therapy 3 weeks ago she is at least 60% better regarding her cough. She does not have any posttussive emesis at this point. She still does feel as though she has something stuck in her throat and sometimes has problems swollowing. She no longer has any problems with her nose. She has resolved her phantosmia. She has not had a headache. She is caffeine free at this point in time.    Medication List           albuterol 108 (90 Base) MCG/ACT inhaler  Commonly known as:  PROAIR HFA  Inhale 2 puffs into the lungs every 4 (four) hours as needed for wheezing or shortness of breath.     buPROPion 100 MG 12 hr tablet  Commonly known as:  WELLBUTRIN SR  Take 100 mg by mouth 2 (two) times daily.     citalopram 20 MG tablet  Commonly known as:  CELEXA  Take 20 mg by mouth daily.     Fluticasone Furoate 200 MCG/ACT Aepb  Commonly known as:  ARNUITY ELLIPTA  Inhale 1 Dose into the lungs daily.     losartan-hydrochlorothiazide 50-12.5 MG tablet  Commonly known as:  HYZAAR  Take 1 tablet by mouth daily.     montelukast 10 MG tablet  Commonly known as:  SINGULAIR  Take 1 tablet (10 mg total) by mouth at bedtime.     pantoprazole 40 MG tablet  Commonly known as:  PROTONIX  Take 1 tablet (40 mg total) by mouth daily.     ranitidine 300 MG tablet  Commonly known as:  ZANTAC  Take 1 tablet (300 mg total) by mouth at bedtime.     RHINOCORT ALLERGY NA  Place 1 spray into the nose daily.     rizatriptan 10 MG tablet  Commonly known as:  MAXALT  Take 1 tablet by mouth daily.     traZODone 100 MG tablet  Commonly known as:  DESYREL  Take 100 mg by mouth at bedtime.     valACYclovir 500 MG tablet  Commonly known as:  VALTREX  Take 500 mg by mouth daily.        Past Medical History  Diagnosis Date  . Herpes genitalia   . Lupus ([place])   . Depression   . Hyperlipidemia   . Anxiety   . H. pylori infection 2015    Past Surgical History  Procedure Laterality Date  . Tubal ligation  1995  . Neck surgery  1985  . Laparoscopic revision ventricular-peritoneal (v-p) shunt  2000  . Leg surgery Left 2002  . Abdominal hysterectomy  2008  . Posterior laminectomy thoracic and lumbar spine Bilateral 1985    Scoliosis stabilization  . Spine surgery  1985    Scoliosis  . Breast surgery Left 02/04/14    left core bx identifying a fibroadenoma and excision of left axillary lymph node, benign    Allergies  Allergen Reactions  . Doxycycline Hyclate Anaphylaxis    Patient reports her throat closes up.  . Levofloxacin  Anaphylaxis  . Sumatriptan Rash  . Clindamycin Diarrhea  . Codeine Swelling  . Diclofenac Potassium     Other reaction(s): GI Upset (intolerance)  . Penicillins Swelling  . Sulfa Antibiotics Swelling    Review of systems negative except as noted in HPI / PMHx or noted below:  Review of Systems  Constitutional: Negative.   HENT: Negative.   Eyes: Negative.   Respiratory: Negative.   Cardiovascular: Negative.   Gastrointestinal: Negative.   Genitourinary: Negative.   Musculoskeletal: Negative.   Skin: Negative.   Neurological: Negative.   Endo/Heme/Allergies: Negative.   Psychiatric/Behavioral: Negative.      Objective:   Filed Vitals:   01/20/16 1050  BP: 110/76  Pulse: 80  Resp: 20          Physical Exam  Constitutional: She is well-developed, well-nourished, and in no distress.  Raspy voice  HENT:  Head: Normocephalic.  Right Ear:  Tympanic membrane, external ear and ear canal normal.  Left Ear: Tympanic membrane, external ear and ear canal normal.  Nose: Nose normal. No mucosal edema or rhinorrhea.  Mouth/Throat: Uvula is midline, oropharynx is clear and moist and mucous membranes are normal. No oropharyngeal exudate.  Eyes: Conjunctivae are normal.  Neck: Trachea normal. No tracheal tenderness present. No tracheal deviation present. No thyromegaly present.  Cardiovascular: Normal rate, regular rhythm, S1 normal, S2 normal and normal heart sounds.   No murmur heard. Pulmonary/Chest: Breath sounds normal. No stridor. No respiratory distress. She has no wheezes. She has no rales.  Musculoskeletal: She exhibits no edema.  Lymphadenopathy:       Head (right side): No tonsillar adenopathy present.       Head (left side): No tonsillar adenopathy present.    She has no cervical adenopathy.  Neurological: She is alert. Gait normal.  Skin: No rash noted. She is not diaphoretic. No erythema. Nails show no clubbing.  Psychiatric: Mood and affect normal.    Diagnostics: Results of blood tests obtained on 12/30/2015 identified a white blood cell count of 12.6 with lymphocytosis at 4.8 and a eosinophil count of 400, hemoglobin of 13.9 with an MCV of 89 and a platelet count of 321. IgG was 1137, IgM 169, IgA was 102 MG/DL. Alpha-1 antitrypsin level was 117 and phenotype was MM   Spirometry was performed and demonstrated an FEV1 of 2.09 at 72 % of predicted.  The patient had an Asthma Control Test with the following results: ACT Total Score: 14.    Assessment and Plan:   1. Asthma, well controlled, mild persistent   2. Allergic rhinoconjunctivitis   3. LPRD (laryngopharyngeal reflux disease)   4. Migraine syndrome   5. Lymphocytosis   6. Essential hypertension     1. Continue to perform Allergen avoidance measures  2. Continue to Treat and prevent reflux:   A. slowly taper off all forms of caffeine and chocolate  B.  Pantoprazole 40 mg one tablet in morning  C. ranitidine 300 mg one tablet in evening  3. Continue to Treat and prevent inflammation:   A. Arnuity 200 one inhalation 1 time per day  B. OTC Rhinocort one spray each nostril one time per day  C. montelukast 10 mg one tablet once a day  4. If needed:   A. nasal saline wash  B. ProAir HFA 2 puffs every 4-6 hours  C. OTC antihistamine - Claritin/Allegra/Zyrtec  one time per day  5. Continue  losartan HCT 50/12.5 mg one time per day to replace ACE  inhibitor  6. Evaluation with ear nose and throat physician for rhinoscopy  7. Repeat CBC w/diff  8. Return to clinic in 9 weeks or earlier if problem  Persis is doing better and we'll keep her on the plan mentioned above for a full 12 weeks and thus I'll see her back in this clinic in 9 weeks. The only issue is that she has some persistent problems with her throat and i would like for her to be evaluated by an ear nose and throat physician to receive rhinoscopy. She did have evaluation with her ear nose and throat physician in [place] but apparently the evaluation was very cursory and quick and not very thorough. As well, I would like for her to repeat her CBC with differential to examine her lymphocytosis and a little more detail. I'll see her back in this clinic in 9 weeks or earlier if there is a problem.  Allena Katz, MD [place]

## 2016-01-20 NOTE — Patient Instructions (Addendum)
  1. Continue to perform Allergen avoidance measures  2. Continue to Treat and prevent reflux:   A. slowly taper off all forms of caffeine and chocolate  B. Pantoprazole 40 mg one tablet in morning  C. ranitidine 300 mg one tablet in evening  3. Continue to Treat and prevent inflammation:   A. Arnuity 200 one inhalation 1 time per day  B. OTC Rhinocort one spray each nostril one time per day  C. montelukast 10 mg one tablet once a day  4. If needed:   A. nasal saline wash  B. ProAir HFA 2 puffs every 4-6 hours  C. OTC antihistamine - Claritin/Allegra/Zyrtec  one time per day  5. Continue  losartan HCT 50/12.5 mg one time per day   6. Evaluation with ear nose and throat physician for rhinoscopy [facility] 02-07-16 @ 10:40)  7. Repeat CBC w/diff  8. Return to clinic in 9 weeks or earlier if problem

## 2016-01-21 LAB — CBC WITH DIFFERENTIAL/PLATELET
BASOS: 0 %
Basophils Absolute: 0 10*3/uL (ref 0.0–0.2)
EOS (ABSOLUTE): 0.2 10*3/uL (ref 0.0–0.4)
EOS: 2 %
Hematocrit: 38.7 % (ref 34.0–46.6)
Hemoglobin: 13.1 g/dL (ref 11.1–15.9)
LYMPHS ABS: 2.4 10*3/uL (ref 0.7–3.1)
Lymphs: 31 %
MCH: 30.5 pg (ref 26.6–33.0)
MCHC: 33.9 g/dL (ref 31.5–35.7)
MCV: 90 fL (ref 79–97)
MONOS ABS: 0.5 10*3/uL (ref 0.1–0.9)
Monocytes: 6 %
Neutrophils Absolute: 4.7 10*3/uL (ref 1.4–7.0)
Neutrophils: 61 %
PLATELETS: 271 10*3/uL (ref 150–379)
RBC: 4.29 x10E6/uL (ref 3.77–5.28)
RDW: 15.7 % — ABNORMAL HIGH (ref 12.3–15.4)
WBC: 7.7 10*3/uL (ref 3.4–10.8)

## 2016-02-07 DIAGNOSIS — R490 Dysphonia: Secondary | ICD-10-CM | POA: Diagnosis not present

## 2016-02-07 DIAGNOSIS — R1310 Dysphagia, unspecified: Secondary | ICD-10-CM | POA: Diagnosis not present

## 2016-02-07 DIAGNOSIS — J342 Deviated nasal septum: Secondary | ICD-10-CM | POA: Diagnosis not present

## 2016-02-07 DIAGNOSIS — R0989 Other specified symptoms and signs involving the circulatory and respiratory systems: Secondary | ICD-10-CM | POA: Diagnosis not present

## 2016-02-17 HISTORY — PX: BRAIN SURGERY: SHX531

## 2016-02-22 DIAGNOSIS — L570 Actinic keratosis: Secondary | ICD-10-CM | POA: Diagnosis not present

## 2016-02-22 DIAGNOSIS — R1310 Dysphagia, unspecified: Secondary | ICD-10-CM | POA: Diagnosis not present

## 2016-02-22 DIAGNOSIS — L578 Other skin changes due to chronic exposure to nonionizing radiation: Secondary | ICD-10-CM | POA: Diagnosis not present

## 2016-02-22 DIAGNOSIS — L821 Other seborrheic keratosis: Secondary | ICD-10-CM | POA: Diagnosis not present

## 2016-03-17 DIAGNOSIS — Q031 Atresia of foramina of Magendie and Luschka: Secondary | ICD-10-CM | POA: Diagnosis not present

## 2016-03-17 DIAGNOSIS — I609 Nontraumatic subarachnoid hemorrhage, unspecified: Secondary | ICD-10-CM | POA: Diagnosis not present

## 2016-03-17 DIAGNOSIS — X58XXXA Exposure to other specified factors, initial encounter: Secondary | ICD-10-CM | POA: Diagnosis not present

## 2016-03-17 DIAGNOSIS — R110 Nausea: Secondary | ICD-10-CM | POA: Diagnosis not present

## 2016-03-17 DIAGNOSIS — I6203 Nontraumatic chronic subdural hemorrhage: Secondary | ICD-10-CM | POA: Diagnosis not present

## 2016-03-17 DIAGNOSIS — I6200 Nontraumatic subdural hemorrhage, unspecified: Secondary | ICD-10-CM | POA: Diagnosis not present

## 2016-03-17 DIAGNOSIS — G8929 Other chronic pain: Secondary | ICD-10-CM | POA: Diagnosis not present

## 2016-03-17 DIAGNOSIS — R9431 Abnormal electrocardiogram [ECG] [EKG]: Secondary | ICD-10-CM | POA: Diagnosis not present

## 2016-03-17 DIAGNOSIS — R51 Headache: Secondary | ICD-10-CM | POA: Diagnosis not present

## 2016-03-17 DIAGNOSIS — D249 Benign neoplasm of unspecified breast: Secondary | ICD-10-CM | POA: Diagnosis not present

## 2016-03-17 DIAGNOSIS — M791 Myalgia: Secondary | ICD-10-CM | POA: Diagnosis not present

## 2016-03-17 DIAGNOSIS — E785 Hyperlipidemia, unspecified: Secondary | ICD-10-CM | POA: Diagnosis not present

## 2016-03-17 DIAGNOSIS — Z885 Allergy status to narcotic agent status: Secondary | ICD-10-CM | POA: Diagnosis not present

## 2016-03-17 DIAGNOSIS — I6201 Nontraumatic acute subdural hemorrhage: Secondary | ICD-10-CM | POA: Diagnosis not present

## 2016-03-17 DIAGNOSIS — Z882 Allergy status to sulfonamides status: Secondary | ICD-10-CM | POA: Diagnosis not present

## 2016-03-17 DIAGNOSIS — Z880 Allergy status to penicillin: Secondary | ICD-10-CM | POA: Diagnosis not present

## 2016-03-17 DIAGNOSIS — Z982 Presence of cerebrospinal fluid drainage device: Secondary | ICD-10-CM | POA: Diagnosis not present

## 2016-03-17 DIAGNOSIS — F419 Anxiety disorder, unspecified: Secondary | ICD-10-CM | POA: Diagnosis not present

## 2016-03-17 DIAGNOSIS — I10 Essential (primary) hypertension: Secondary | ICD-10-CM | POA: Diagnosis not present

## 2016-03-17 DIAGNOSIS — M542 Cervicalgia: Secondary | ICD-10-CM | POA: Diagnosis not present

## 2016-03-17 DIAGNOSIS — G936 Cerebral edema: Secondary | ICD-10-CM | POA: Diagnosis not present

## 2016-03-17 DIAGNOSIS — S064X0A Epidural hemorrhage without loss of consciousness, initial encounter: Secondary | ICD-10-CM | POA: Diagnosis not present

## 2016-03-18 DIAGNOSIS — Z982 Presence of cerebrospinal fluid drainage device: Secondary | ICD-10-CM | POA: Diagnosis not present

## 2016-03-18 DIAGNOSIS — I6200 Nontraumatic subdural hemorrhage, unspecified: Secondary | ICD-10-CM | POA: Diagnosis not present

## 2016-03-18 DIAGNOSIS — S065X9A Traumatic subdural hemorrhage with loss of consciousness of unspecified duration, initial encounter: Secondary | ICD-10-CM

## 2016-03-18 DIAGNOSIS — S065XAA Traumatic subdural hemorrhage with loss of consciousness status unknown, initial encounter: Secondary | ICD-10-CM | POA: Insufficient documentation

## 2016-03-18 DIAGNOSIS — Q031 Atresia of foramina of Magendie and Luschka: Secondary | ICD-10-CM | POA: Diagnosis not present

## 2016-03-18 HISTORY — DX: Traumatic subdural hemorrhage with loss of consciousness status unknown, initial encounter: S06.5XAA

## 2016-03-18 HISTORY — PX: BURR HOLE FOR SUBDURAL HEMATOMA: SHX1275

## 2016-03-18 HISTORY — DX: Traumatic subdural hemorrhage with loss of consciousness of unspecified duration, initial encounter: S06.5X9A

## 2016-03-19 DIAGNOSIS — G919 Hydrocephalus, unspecified: Secondary | ICD-10-CM | POA: Diagnosis not present

## 2016-03-19 DIAGNOSIS — M7989 Other specified soft tissue disorders: Secondary | ICD-10-CM | POA: Diagnosis not present

## 2016-03-23 ENCOUNTER — Encounter: Payer: Self-pay | Admitting: Allergy and Immunology

## 2016-03-23 ENCOUNTER — Ambulatory Visit (INDEPENDENT_AMBULATORY_CARE_PROVIDER_SITE_OTHER): Payer: PPO | Admitting: Allergy and Immunology

## 2016-03-23 VITALS — BP 104/82 | HR 88 | Resp 20

## 2016-03-23 DIAGNOSIS — G43109 Migraine with aura, not intractable, without status migrainosus: Secondary | ICD-10-CM

## 2016-03-23 DIAGNOSIS — J309 Allergic rhinitis, unspecified: Secondary | ICD-10-CM | POA: Diagnosis not present

## 2016-03-23 DIAGNOSIS — J4530 Mild persistent asthma, uncomplicated: Secondary | ICD-10-CM

## 2016-03-23 DIAGNOSIS — J387 Other diseases of larynx: Secondary | ICD-10-CM

## 2016-03-23 DIAGNOSIS — I10 Essential (primary) hypertension: Secondary | ICD-10-CM | POA: Diagnosis not present

## 2016-03-23 DIAGNOSIS — H1010 Acute atopic conjunctivitis, unspecified eye: Secondary | ICD-10-CM | POA: Diagnosis not present

## 2016-03-23 DIAGNOSIS — K219 Gastro-esophageal reflux disease without esophagitis: Secondary | ICD-10-CM

## 2016-03-23 DIAGNOSIS — G43909 Migraine, unspecified, not intractable, without status migrainosus: Secondary | ICD-10-CM

## 2016-03-23 MED ORDER — ALBUTEROL SULFATE HFA 108 (90 BASE) MCG/ACT IN AERS: INHALATION_SPRAY | RESPIRATORY_TRACT | Status: DC

## 2016-03-23 NOTE — Patient Instructions (Signed)
  1. Continue to perform Allergen avoidance measures  2. Continue to Treat and prevent reflux:   A. slowly taper off all forms of caffeine and chocolate  B. Pantoprazole 40 mg one tablet in morning  C. ranitidine 300 mg one tablet in evening  3. Continue to Treat and prevent inflammation:   A. discontinue Arnuity    B. decrease OTC Rhinocort one spray each nostril 3 days a week  C. montelukast 10 mg one tablet once a day  4. If needed:   A. nasal saline wash  B. ProAir HFA 2 puffs every 4-6 hours  C. OTC antihistamine - Claritin/Allegra/Zyrtec  one time per day  5. Continue  losartan HCT 50/12.5 mg one time per day   6. Return to clinic in November 2017 or earlier if problem  7. Obtain fall flu vaccine

## 2016-03-23 NOTE — Progress Notes (Signed)
Follow-up Note  Referring Provider: Algis Greenhouse, MD Primary Provider: Teressa Lower, MD Date of Office Visit: 03/23/2016  Subjective:   [name] (DOB: [birth year]) is a 45 y.o. female who returns to the [facility] [place] on 03/23/2016 in re-evaluation of the following:  HPI: [name] returns to this clinic in reevaluation of her asthma, allergic rhinoconjunctivitis, migraines, and reflux-induced respiratory disease and history of ACE inhibitor induced cough. Since her last visit with me in May 2017 she has basically resolved all of her respiratory tract symptoms and her headaches and her reflux is doing well and her throat is doing well and she has no need to use any short acting bronchodilator and she has stopped her inhaled steroid but continues to aggressively treat her reflux with a combination of a proton pump inhibitor and H2 receptor blocker and continues to use Rhinocort consistently. She did have some problems with nosebleeds recently.  During the interval since her last visit [name] has undergone emergency decompression of the subdural hematoma that was believed to be secondary to a head trauma sustained while four wheeling back this winter. Fortunately, this was a successful surgery and she has no sequela as a result of that subdural hematoma.    Medication List           albuterol 108 (90 Base) MCG/ACT inhaler  Commonly known as:  PROAIR HFA  Inhale 2 puffs into the lungs every 4 (four) hours as needed for wheezing or shortness of breath.     buPROPion 100 MG 12 hr tablet  Commonly known as:  WELLBUTRIN SR  Take 100 mg by mouth 2 (two) times daily.     cetirizine 10 MG tablet  Commonly known as:  ZYRTEC  Take 10 mg by mouth.     citalopram 20 MG tablet  Commonly known as:  CELEXA  Take 20 mg by mouth daily.     Fluticasone Furoate 200 MCG/ACT Aepb  Commonly known as:  ARNUITY ELLIPTA  Inhale 1 Dose into the lungs daily.     levETIRAcetam 500  MG tablet  Commonly known as:  KEPPRA  Take 500 mg by mouth.     losartan-hydrochlorothiazide 50-12.5 MG tablet  Commonly known as:  HYZAAR  Take 1 tablet by mouth daily.     montelukast 10 MG tablet  Commonly known as:  SINGULAIR  Take 1 tablet (10 mg total) by mouth at bedtime.     oxyCODONE 5 MG immediate release tablet  Commonly known as:  Oxy IR/ROXICODONE     pantoprazole 40 MG tablet  Commonly known as:  PROTONIX  Take 1 tablet (40 mg total) by mouth daily.     ranitidine 300 MG tablet  Commonly known as:  ZANTAC  Take 1 tablet (300 mg total) by mouth at bedtime.     RHINOCORT ALLERGY NA  Place 1 spray into the nose daily.     rizatriptan 10 MG tablet  Commonly known as:  MAXALT  Take 1 tablet by mouth as needed.     topiramate 100 MG tablet  Commonly known as:  TOPAMAX  Take 100 mg by mouth.     valACYclovir 500 MG tablet  Commonly known as:  VALTREX  Take 500 mg by mouth daily.        Past Medical History  Diagnosis Date  . Herpes genitalia   . Lupus ([place])   . Depression   . Hyperlipidemia   . Anxiety   .  H. pylori infection 2015  . 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 Tubal ligation  1995  . Neck surgery  1985  . Laparoscopic revision ventricular-peritoneal (v-p) shunt  2000  . Leg surgery Left 2002  . Abdominal hysterectomy  2008  . Posterior laminectomy thoracic and lumbar spine Bilateral 1985    Scoliosis stabilization  . Spine surgery  1985    Scoliosis  . Breast surgery Left 02/04/14    left core bx identifying a fibroadenoma and excision of left axillary lymph node, benign  . Trudee Kuster hole for subdural hematoma  March 18, 2016  . Shunt revision  July 2017    Allergies  Allergen Reactions  . Levofloxacin Anaphylaxis  . Sumatriptan Rash  . Clindamycin Diarrhea  . Codeine Swelling  . Diclofenac Potassium     Other reaction(s): GI Upset (intolerance)  . Penicillins  Swelling  . Sulfa Antibiotics Swelling    Review of systems negative except as noted in HPI / PMHx or noted below:  Review of Systems  Constitutional: Negative.   HENT: Negative.   Eyes: Negative.   Respiratory: Negative.   Cardiovascular: Negative.   Gastrointestinal: Negative.   Genitourinary: Negative.   Musculoskeletal: Negative.   Skin: Negative.   Neurological: Negative.   Endo/Heme/Allergies: Negative.   Psychiatric/Behavioral: Negative.      Objective:   Filed Vitals:   03/23/16 1151  BP: 104/82  Pulse: 88  Resp: 20          Physical Exam  Constitutional: She is well-developed, well-nourished, and in no distress.  HENT:  Head: Normocephalic.  Right Ear: Tympanic membrane, external ear and ear canal normal.  Left Ear: Tympanic membrane, external ear and ear canal normal.  Nose: Nose normal. No mucosal edema or rhinorrhea.  Mouth/Throat: Uvula is midline, oropharynx is clear and moist and mucous membranes are normal. No oropharyngeal exudate.  Eyes: Conjunctivae are normal.  Neck: Trachea normal. No tracheal tenderness present. No tracheal deviation present. No thyromegaly present.  Cardiovascular: Normal rate, regular rhythm, S1 normal, S2 normal and normal heart sounds.   No murmur heard. Pulmonary/Chest: Breath sounds normal. No stridor. No respiratory distress. She has no wheezes. She has no rales.  Musculoskeletal: She exhibits no edema.  Lymphadenopathy:       Head (right side): No tonsillar adenopathy present.       Head (left side): No tonsillar adenopathy present.    She has no cervical adenopathy.  Neurological: She is alert. Gait normal.  Skin: Rash (Surgical scars of scalp) noted. She is not diaphoretic. No erythema. Nails show no clubbing.  Psychiatric: Mood and affect normal.    Diagnostics: A repeat CBC with differential obtained on 01/20/2016 identified resolution of her previously documented lymphocytosis with an absolute lymphocyte count  of 2400.  Spirometry was not performed secondary to her recent surgery  Assessment and Plan:   1. Asthma, well controlled, mild persistent   2. Allergic rhinoconjunctivitis   3. LPRD (laryngopharyngeal reflux disease)   4. Migraine syndrome   5. Essential hypertension     1. Continue to perform Allergen avoidance measures  2. Continue to Treat and prevent reflux:   A. slowly taper off all forms of caffeine and chocolate  B. Pantoprazole 40 mg one tablet in morning  C. ranitidine 300 mg one tablet in evening  3. Continue to Treat and prevent inflammation:   A. discontinue Arnuity  B. decrease OTC Rhinocort one spray each nostril 3 days a week  C. montelukast 10 mg one tablet once a day  4. If needed:   A. nasal saline wash  B. ProAir HFA 2 puffs every 4-6 hours  C. OTC antihistamine - Claritin/Allegra/Zyrtec  one time per day  5. Continue losartan HCT 50/12.5 mg one time per day to replace ACE inhibitor  6. Return to clinic in November 2017 or earlier if problem  7. Obtain fall flu vaccine  Overall Shaunell is doing well and we'll not have her restart her inhaled steroid at this point in time and she'll decrease her dose of Rhinocort given her history of epistaxis and we'll continue to have her use therapy directed against reflux and regroup with her in November 2017 or earlier if there is a problem.  Allena Katz, MD [place]

## 2016-04-07 DIAGNOSIS — G919 Hydrocephalus, unspecified: Secondary | ICD-10-CM | POA: Diagnosis not present

## 2016-04-07 DIAGNOSIS — I6203 Nontraumatic chronic subdural hemorrhage: Secondary | ICD-10-CM | POA: Diagnosis not present

## 2016-04-10 DIAGNOSIS — G40909 Epilepsy, unspecified, not intractable, without status epilepticus: Secondary | ICD-10-CM

## 2016-04-10 HISTORY — DX: Epilepsy, unspecified, not intractable, without status epilepticus: G40.909

## 2016-04-21 DIAGNOSIS — Q043 Other reduction deformities of brain: Secondary | ICD-10-CM | POA: Diagnosis not present

## 2016-04-21 DIAGNOSIS — Z09 Encounter for follow-up examination after completed treatment for conditions other than malignant neoplasm: Secondary | ICD-10-CM | POA: Diagnosis not present

## 2016-04-21 DIAGNOSIS — I6203 Nontraumatic chronic subdural hemorrhage: Secondary | ICD-10-CM | POA: Diagnosis not present

## 2016-04-21 DIAGNOSIS — Z982 Presence of cerebrospinal fluid drainage device: Secondary | ICD-10-CM | POA: Diagnosis not present

## 2016-04-21 DIAGNOSIS — R42 Dizziness and giddiness: Secondary | ICD-10-CM | POA: Diagnosis not present

## 2016-04-21 DIAGNOSIS — R51 Headache: Secondary | ICD-10-CM | POA: Diagnosis not present

## 2016-04-21 DIAGNOSIS — G9389 Other specified disorders of brain: Secondary | ICD-10-CM | POA: Diagnosis not present

## 2016-04-21 DIAGNOSIS — G919 Hydrocephalus, unspecified: Secondary | ICD-10-CM | POA: Diagnosis not present

## 2016-05-05 DIAGNOSIS — N644 Mastodynia: Secondary | ICD-10-CM | POA: Diagnosis not present

## 2016-05-08 ENCOUNTER — Other Ambulatory Visit: Payer: Self-pay | Admitting: Family Medicine

## 2016-05-08 DIAGNOSIS — N644 Mastodynia: Secondary | ICD-10-CM

## 2016-05-09 ENCOUNTER — Other Ambulatory Visit: Payer: Self-pay | Admitting: Family Medicine

## 2016-05-09 DIAGNOSIS — N644 Mastodynia: Secondary | ICD-10-CM

## 2016-05-23 DIAGNOSIS — L700 Acne vulgaris: Secondary | ICD-10-CM | POA: Diagnosis not present

## 2016-05-23 DIAGNOSIS — L570 Actinic keratosis: Secondary | ICD-10-CM | POA: Diagnosis not present

## 2016-05-23 DIAGNOSIS — L578 Other skin changes due to chronic exposure to nonionizing radiation: Secondary | ICD-10-CM | POA: Diagnosis not present

## 2016-05-25 ENCOUNTER — Ambulatory Visit: Payer: Medicare Other

## 2016-05-25 ENCOUNTER — Other Ambulatory Visit: Payer: Medicare Other

## 2016-05-28 DIAGNOSIS — E782 Mixed hyperlipidemia: Secondary | ICD-10-CM | POA: Insufficient documentation

## 2016-05-28 DIAGNOSIS — R7303 Prediabetes: Secondary | ICD-10-CM

## 2016-05-28 HISTORY — DX: Mixed hyperlipidemia: E78.2

## 2016-05-28 HISTORY — DX: Prediabetes: R73.03

## 2016-05-30 DIAGNOSIS — F411 Generalized anxiety disorder: Secondary | ICD-10-CM | POA: Diagnosis not present

## 2016-05-30 DIAGNOSIS — Z131 Encounter for screening for diabetes mellitus: Secondary | ICD-10-CM | POA: Diagnosis not present

## 2016-05-30 DIAGNOSIS — I10 Essential (primary) hypertension: Secondary | ICD-10-CM | POA: Diagnosis not present

## 2016-05-30 DIAGNOSIS — E782 Mixed hyperlipidemia: Secondary | ICD-10-CM | POA: Diagnosis not present

## 2016-05-30 DIAGNOSIS — F3340 Major depressive disorder, recurrent, in remission, unspecified: Secondary | ICD-10-CM | POA: Diagnosis not present

## 2016-05-31 ENCOUNTER — Ambulatory Visit
Admission: RE | Admit: 2016-05-31 | Discharge: 2016-05-31 | Disposition: A | Discharge status: Home / Self Care | Payer: PPO | Source: Ambulatory Visit | Attending: Family Medicine | Admitting: Family Medicine

## 2016-05-31 DIAGNOSIS — N644 Mastodynia: Secondary | ICD-10-CM

## 2016-05-31 DIAGNOSIS — R922 Inconclusive mammogram: Secondary | ICD-10-CM | POA: Diagnosis not present

## 2016-05-31 DIAGNOSIS — R928 Other abnormal and inconclusive findings on diagnostic imaging of breast: Secondary | ICD-10-CM | POA: Insufficient documentation

## 2016-06-27 ENCOUNTER — Other Ambulatory Visit: Payer: Self-pay | Admitting: Allergy and Immunology

## 2016-07-07 DIAGNOSIS — R51 Headache: Secondary | ICD-10-CM | POA: Diagnosis not present

## 2016-07-07 DIAGNOSIS — Q031 Atresia of foramina of Magendie and Luschka: Secondary | ICD-10-CM | POA: Diagnosis not present

## 2016-07-10 DIAGNOSIS — L03114 Cellulitis of left upper limb: Secondary | ICD-10-CM | POA: Diagnosis not present

## 2016-07-10 DIAGNOSIS — T148XXA Other injury of unspecified body region, initial encounter: Secondary | ICD-10-CM | POA: Diagnosis not present

## 2016-07-10 DIAGNOSIS — L089 Local infection of the skin and subcutaneous tissue, unspecified: Secondary | ICD-10-CM | POA: Diagnosis not present

## 2016-07-24 ENCOUNTER — Ambulatory Visit: Payer: PPO | Admitting: Allergy and Immunology

## 2016-07-30 ENCOUNTER — Other Ambulatory Visit: Payer: Self-pay | Admitting: Allergy and Immunology

## 2016-08-03 ENCOUNTER — Ambulatory Visit (INDEPENDENT_AMBULATORY_CARE_PROVIDER_SITE_OTHER): Payer: PPO | Admitting: Allergy and Immunology

## 2016-08-03 ENCOUNTER — Encounter: Payer: Self-pay | Admitting: Allergy and Immunology

## 2016-08-03 VITALS — BP 110/68 | HR 72 | Resp 20

## 2016-08-03 DIAGNOSIS — J4531 Mild persistent asthma with (acute) exacerbation: Secondary | ICD-10-CM

## 2016-08-03 DIAGNOSIS — K219 Gastro-esophageal reflux disease without esophagitis: Secondary | ICD-10-CM | POA: Diagnosis not present

## 2016-08-03 DIAGNOSIS — J3089 Other allergic rhinitis: Secondary | ICD-10-CM

## 2016-08-03 MED ORDER — RANITIDINE HCL 300 MG PO TABS: 300.0000 mg | ORAL_TABLET | Freq: Every day | ORAL | 5 refills | Status: DC

## 2016-08-03 MED ORDER — METHYLPREDNISOLONE ACETATE 80 MG/ML IJ SUSP
80.0000 mg | Freq: Once | INTRAMUSCULAR | Status: AC
  Administered 2016-08-03: 80 mg via INTRAMUSCULAR

## 2016-08-03 MED ORDER — FLUTICASONE FUROATE 200 MCG/ACT IN AEPB: 1.0000 | INHALATION_SPRAY | Freq: Every day | RESPIRATORY_TRACT | 5 refills | Status: DC

## 2016-08-03 MED ORDER — ALBUTEROL SULFATE HFA 108 (90 BASE) MCG/ACT IN AERS: INHALATION_SPRAY | RESPIRATORY_TRACT | 1 refills | Status: DC

## 2016-08-03 MED ORDER — LOSARTAN POTASSIUM-HCTZ 50-12.5 MG PO TABS: 1.0000 | ORAL_TABLET | Freq: Every day | ORAL | 5 refills | Status: DC

## 2016-08-03 NOTE — Patient Instructions (Addendum)
  1. Continue to perform Allergen avoidance measures  2. Continue to Treat and prevent reflux:   A. Remain off all forms of caffeine and chocolate  B. Pantoprazole 40 mg one tablet in morning  C. ranitidine 300 mg one tablet in evening  3. Continue to Treat and prevent inflammation:   A. restart Arnuity 200 one inhalation one time a day (or substitute)  B. OTC Rhinocort one spray each nostril 3 days a week  C. montelukast 10 mg one tablet once a day  4. If needed:   A. nasal saline wash  B. ProAir HFA 2 puffs every 4-6 hours (or substitute)  C. OTC antihistamine - Zyrtec  one time per day  5. Depomedrol 80 IM delivered in clinic.  6. Return to clinic in February 2018 or earlier if problem

## 2016-08-03 NOTE — Progress Notes (Signed)
Follow-up Note  Referring Provider: Algis Greenhouse, MD Primary Provider: Teressa Lower, MD Date of Office Visit: 08/03/2016  Subjective:   [name] (DOB: 12-17-1970) is a 45 y.o. female who returns to the [facility] [place] on 08/03/2016 in re-evaluation of the following:  HPI: [name] presents this clinic in reevaluation of her asthma and allergic rhinoconjunctivitis and reflux-induced respiratory disease. I last saw her in his clinic in July 2017.  During the interval she was doing relatively well but unfortunately last Wednesday night after lighting the wood stove she developed a "asthma attack". Her asthma attack was manifested as throat closing and she could not speak along with some coughing. Since then she's had a little bit of a cough with some mucus production. She's also had some nasal congestion and has been blowing out some clear to ugly nasal discharge. Last week she did have a bloody nose but that has since resolved.  She was doing quite well with her upper airways and did not have any significant problem requiring her to receive a antibiotic for an episode of sinusitis. Her reflux is under excellent control.    Medication List      albuterol 108 (90 Base) MCG/ACT inhaler Commonly known as:  PROAIR HFA Inhale two puffs every four to six hours as needed for cough or wheeze.   buPROPion 100 MG 12 hr tablet Commonly known as:  WELLBUTRIN SR Take 100 mg by mouth 2 (two) times daily.   cetirizine 10 MG tablet Commonly known as:  ZYRTEC Take 10 mg by mouth.   citalopram 20 MG tablet Commonly known as:  CELEXA Take 20 mg by mouth daily.   Fluticasone Furoate 200 MCG/ACT Aepb Commonly known as:  ARNUITY ELLIPTA Inhale 1 Dose into the lungs daily.   levETIRAcetam 500 MG tablet Commonly known as:  KEPPRA Take 500 mg by mouth.   losartan-hydrochlorothiazide 50-12.5 MG tablet Commonly known as:  HYZAAR TAKE ONE TABLET BY MOUTH ONCE DAILY     montelukast 10 MG tablet Commonly known as:  SINGULAIR Take 1 tablet (10 mg total) by mouth at bedtime.   pantoprazole 40 MG tablet Commonly known as:  PROTONIX TAKE ONE TABLET BY MOUTH ONCE DAILY   ranitidine 300 MG tablet Commonly known as:  ZANTAC TAKE ONE TABLET BY MOUTH AT BEDTIME   RHINOCORT ALLERGY NA Place 1 spray into the nose daily.   rizatriptan 10 MG tablet Commonly known as:  MAXALT Take 1 tablet by mouth as needed.   topiramate 100 MG tablet Commonly known as:  TOPAMAX Take 100 mg by mouth.   valACYclovir 500 MG tablet Commonly known as:  VALTREX Take 500 mg by mouth daily.       Past Medical History:  Diagnosis Date  . Anxiety   . Depression   . H. pylori infection 2015  . Herpes genitalia   . Hyperlipidemia   . Lupus   . Subdural hematoma [facility]) July 2017   Bilateral    Past Surgical History:  Procedure Laterality Date  . ABDOMINAL HYSTERECTOMY  2008  . 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 BREAST EXCISIONAL BIOPSY Left 01/2014   lymph node  . BREAST SURGERY Left 02/04/14   left core bx identifying a fibroadenoma and excision of left axillary lymph node, benign  . BURR HOLE FOR SUBDURAL HEMATOMA  March 18, 2016  . LAPAROSCOPIC REVISION VENTRICULAR-PERITONEAL (V-P) SHUNT  2000  . LEG SURGERY Left 2002  .  NECK SURGERY  1985  . POSTERIOR LAMINECTOMY THORACIC AND LUMBAR SPINE Bilateral 1985   Scoliosis stabilization  . SHUNT REVISION  July 2017  . SPINE SURGERY  1985   Scoliosis  . TUBAL LIGATION  1995    Allergies  Allergen Reactions  . Levofloxacin Anaphylaxis  . Sumatriptan Rash  . Clindamycin Diarrhea  . Codeine Swelling  . Diclofenac Potassium     Other reaction(s): GI Upset (intolerance)  . Penicillins Swelling  . Sulfa Antibiotics Swelling    Review of systems negative except as noted in HPI / PMHx or noted below:  Review of Systems  Constitutional: Negative.   HENT: Negative.   Eyes: Negative.   Respiratory:  Negative.   Cardiovascular: Negative.   Gastrointestinal: Negative.   Genitourinary: Negative.   Musculoskeletal: Negative.   Skin: Negative.   Neurological: Negative.   Endo/Heme/Allergies: Negative.   Psychiatric/Behavioral: Negative.      Objective:   Vitals:   08/03/16 1130  BP: 110/68  Pulse: 72  Resp: 20          Physical Exam  Constitutional: She is well-developed, well-nourished, and in no distress.  HENT:  Head: Normocephalic.  Right Ear: Tympanic membrane, external ear and ear canal normal.  Left Ear: Tympanic membrane, external ear and ear canal normal.  Nose: Nose normal. No mucosal edema or rhinorrhea.  Mouth/Throat: Uvula is midline, oropharynx is clear and moist and mucous membranes are normal. No oropharyngeal exudate.  Septal perforation  Eyes: Conjunctivae are normal.  Neck: Trachea normal. No tracheal tenderness present. No tracheal deviation present. No thyromegaly present.  Cardiovascular: Normal rate, regular rhythm, S1 normal, S2 normal and normal heart sounds.   No murmur heard. Pulmonary/Chest: Breath sounds normal. No stridor. No respiratory distress. She has no wheezes. She has no rales.  Musculoskeletal: She exhibits no edema.  Lymphadenopathy:       Head (right side): No tonsillar adenopathy present.       Head (left side): No tonsillar adenopathy present.    She has no cervical adenopathy.  Neurological: She is alert. Gait normal.  Skin: No rash noted. She is not diaphoretic. No erythema. Nails show no clubbing.  Psychiatric: Mood and affect normal.    Diagnostics:    Spirometry was performed and demonstrated an FEV1 of 2.26 at 78 % of predicted.  The patient had an Asthma Control Test with the following results: ACT Total Score: 10.    Assessment and Plan:   1. Asthma, not well controlled, mild persistent, with acute exacerbation   2. Other allergic rhinitis   3. LPRD (laryngopharyngeal reflux disease)     1. Continue to  perform Allergen avoidance measures  2. Continue to Treat and prevent reflux:   A. Remain off all forms of caffeine and chocolate  B. Pantoprazole 40 mg one tablet in morning  C. ranitidine 300 mg one tablet in evening  3. Continue to Treat and prevent inflammation:   A. restart Arnuity 200 one inhalation one time a day (or substitute)  B. OTC Rhinocort one spray each nostril 3 days a week  C. montelukast 10 mg one tablet once a day  4. If needed:   A. nasal saline wash  B. ProAir HFA 2 puffs every 4-6 hours (or substitute)  C. OTC antihistamine - Zyrtec  one time per day  5. Depomedrol 80 IM delivered in clinic.  6. Return to clinic in February 2018 or earlier if problem  I will assume that Donnabell has  a respiratory tract inflammatory flare and treat her with reinstitution of her inhaled steroid and a systemic steroid. However, I did have a talk with her today about the fact that she might have had some vocal cord dysfunction. I've informed her that if she develops a sudden onset episode of not able to breathe or talk it is probably her throat that is causing more of a problem than her lungs. She'll keep in contact with me noting her response to this approach. We may need to manipulate her medications because she is now on a Medicare advantage plan and there may be preferred products other than her Arnuity and ProAir HFA. If she does well I will see her in February or earlier if there is a problem.  Allena Katz, MD [place]

## 2016-08-14 DIAGNOSIS — R739 Hyperglycemia, unspecified: Secondary | ICD-10-CM | POA: Diagnosis not present

## 2016-08-14 DIAGNOSIS — Z131 Encounter for screening for diabetes mellitus: Secondary | ICD-10-CM | POA: Diagnosis not present

## 2016-08-14 DIAGNOSIS — E782 Mixed hyperlipidemia: Secondary | ICD-10-CM | POA: Diagnosis not present

## 2016-09-27 ENCOUNTER — Other Ambulatory Visit: Payer: Self-pay | Admitting: Allergy and Immunology

## 2016-09-29 DIAGNOSIS — H47033 Optic nerve hypoplasia, bilateral: Secondary | ICD-10-CM | POA: Diagnosis not present

## 2016-09-29 DIAGNOSIS — I6200 Nontraumatic subdural hemorrhage, unspecified: Secondary | ICD-10-CM | POA: Diagnosis not present

## 2016-10-24 DIAGNOSIS — M79672 Pain in left foot: Secondary | ICD-10-CM | POA: Diagnosis not present

## 2016-10-31 ENCOUNTER — Other Ambulatory Visit: Payer: Self-pay | Admitting: Allergy and Immunology

## 2016-11-07 DIAGNOSIS — M722 Plantar fascial fibromatosis: Secondary | ICD-10-CM

## 2016-11-07 DIAGNOSIS — M79673 Pain in unspecified foot: Secondary | ICD-10-CM | POA: Diagnosis not present

## 2016-11-07 DIAGNOSIS — M6702 Short Achilles tendon (acquired), left ankle: Secondary | ICD-10-CM

## 2016-11-07 HISTORY — DX: Plantar fascial fibromatosis: M72.2

## 2016-11-07 HISTORY — DX: Short Achilles tendon (acquired), left ankle: M67.02

## 2016-11-16 ENCOUNTER — Other Ambulatory Visit: Payer: Self-pay | Admitting: *Deleted

## 2016-11-16 DIAGNOSIS — Z7689 Persons encountering health services in other specified circumstances: Secondary | ICD-10-CM | POA: Insufficient documentation

## 2016-11-16 DIAGNOSIS — Z1239 Encounter for other screening for malignant neoplasm of breast: Secondary | ICD-10-CM

## 2016-11-16 DIAGNOSIS — Z0000 Encounter for general adult medical examination without abnormal findings: Secondary | ICD-10-CM | POA: Insufficient documentation

## 2016-11-16 HISTORY — DX: Persons encountering health services in other specified circumstances: Z76.89

## 2016-11-16 HISTORY — DX: Encounter for other screening for malignant neoplasm of breast: Z12.39

## 2016-11-16 MED ORDER — LOSARTAN POTASSIUM-HCTZ 50-12.5 MG PO TABS: ORAL_TABLET | ORAL | 0 refills | Status: DC

## 2016-11-21 DIAGNOSIS — R1011 Right upper quadrant pain: Secondary | ICD-10-CM | POA: Diagnosis not present

## 2016-11-21 DIAGNOSIS — R197 Diarrhea, unspecified: Secondary | ICD-10-CM | POA: Diagnosis not present

## 2016-11-25 DIAGNOSIS — R1011 Right upper quadrant pain: Secondary | ICD-10-CM | POA: Diagnosis not present

## 2016-11-28 DIAGNOSIS — M6702 Short Achilles tendon (acquired), left ankle: Secondary | ICD-10-CM | POA: Diagnosis not present

## 2016-11-28 DIAGNOSIS — M722 Plantar fascial fibromatosis: Secondary | ICD-10-CM | POA: Diagnosis not present

## 2016-11-29 DIAGNOSIS — H5213 Myopia, bilateral: Secondary | ICD-10-CM | POA: Diagnosis not present

## 2016-11-29 DIAGNOSIS — H524 Presbyopia: Secondary | ICD-10-CM | POA: Diagnosis not present

## 2016-11-30 DIAGNOSIS — Z131 Encounter for screening for diabetes mellitus: Secondary | ICD-10-CM | POA: Diagnosis not present

## 2016-11-30 DIAGNOSIS — N63 Unspecified lump in unspecified breast: Secondary | ICD-10-CM | POA: Diagnosis not present

## 2016-11-30 DIAGNOSIS — E782 Mixed hyperlipidemia: Secondary | ICD-10-CM | POA: Diagnosis not present

## 2016-11-30 DIAGNOSIS — Z1231 Encounter for screening mammogram for malignant neoplasm of breast: Secondary | ICD-10-CM | POA: Diagnosis not present

## 2016-11-30 DIAGNOSIS — I10 Essential (primary) hypertension: Secondary | ICD-10-CM | POA: Diagnosis not present

## 2016-11-30 DIAGNOSIS — F3340 Major depressive disorder, recurrent, in remission, unspecified: Secondary | ICD-10-CM | POA: Diagnosis not present

## 2016-11-30 DIAGNOSIS — F411 Generalized anxiety disorder: Secondary | ICD-10-CM | POA: Diagnosis not present

## 2016-12-01 DIAGNOSIS — E782 Mixed hyperlipidemia: Secondary | ICD-10-CM | POA: Diagnosis not present

## 2016-12-06 DIAGNOSIS — N6320 Unspecified lump in the left breast, unspecified quadrant: Secondary | ICD-10-CM | POA: Diagnosis not present

## 2016-12-06 DIAGNOSIS — N6321 Unspecified lump in the left breast, upper outer quadrant: Secondary | ICD-10-CM | POA: Diagnosis not present

## 2016-12-08 DIAGNOSIS — K2970 Gastritis, unspecified, without bleeding: Secondary | ICD-10-CM | POA: Diagnosis not present

## 2016-12-08 DIAGNOSIS — K2950 Unspecified chronic gastritis without bleeding: Secondary | ICD-10-CM | POA: Diagnosis not present

## 2016-12-08 DIAGNOSIS — R197 Diarrhea, unspecified: Secondary | ICD-10-CM | POA: Diagnosis not present

## 2016-12-08 DIAGNOSIS — R1011 Right upper quadrant pain: Secondary | ICD-10-CM | POA: Diagnosis not present

## 2016-12-08 DIAGNOSIS — K635 Polyp of colon: Secondary | ICD-10-CM | POA: Diagnosis not present

## 2016-12-08 DIAGNOSIS — D125 Benign neoplasm of sigmoid colon: Secondary | ICD-10-CM | POA: Diagnosis not present

## 2016-12-08 DIAGNOSIS — K317 Polyp of stomach and duodenum: Secondary | ICD-10-CM | POA: Diagnosis not present

## 2016-12-08 HISTORY — PX: COLONOSCOPY W/ BIOPSIES: SHX1374

## 2016-12-08 HISTORY — PX: UPPER GI ENDOSCOPY: SHX6162

## 2016-12-22 ENCOUNTER — Other Ambulatory Visit: Payer: Self-pay | Admitting: Allergy and Immunology

## 2016-12-26 DIAGNOSIS — R197 Diarrhea, unspecified: Secondary | ICD-10-CM | POA: Diagnosis not present

## 2016-12-26 DIAGNOSIS — K219 Gastro-esophageal reflux disease without esophagitis: Secondary | ICD-10-CM | POA: Diagnosis not present

## 2016-12-26 DIAGNOSIS — R1011 Right upper quadrant pain: Secondary | ICD-10-CM | POA: Diagnosis not present

## 2017-01-02 DIAGNOSIS — M6702 Short Achilles tendon (acquired), left ankle: Secondary | ICD-10-CM | POA: Diagnosis not present

## 2017-01-02 DIAGNOSIS — M722 Plantar fascial fibromatosis: Secondary | ICD-10-CM | POA: Diagnosis not present

## 2017-01-05 DIAGNOSIS — I6203 Nontraumatic chronic subdural hemorrhage: Secondary | ICD-10-CM | POA: Diagnosis not present

## 2017-01-10 DIAGNOSIS — Q043 Other reduction deformities of brain: Secondary | ICD-10-CM | POA: Diagnosis not present

## 2017-01-10 DIAGNOSIS — Q031 Atresia of foramina of Magendie and Luschka: Secondary | ICD-10-CM | POA: Diagnosis not present

## 2017-01-10 DIAGNOSIS — G9389 Other specified disorders of brain: Secondary | ICD-10-CM | POA: Diagnosis not present

## 2017-01-10 DIAGNOSIS — I6203 Nontraumatic chronic subdural hemorrhage: Secondary | ICD-10-CM | POA: Diagnosis not present

## 2017-01-10 DIAGNOSIS — Z982 Presence of cerebrospinal fluid drainage device: Secondary | ICD-10-CM | POA: Diagnosis not present

## 2017-01-16 DIAGNOSIS — Z982 Presence of cerebrospinal fluid drainage device: Secondary | ICD-10-CM | POA: Diagnosis not present

## 2017-01-23 ENCOUNTER — Other Ambulatory Visit: Payer: Self-pay | Admitting: Pharmacy Technician

## 2017-01-23 NOTE — Patient Outreach (Signed)
[facility]) Care Management  01/23/2017  [name] [birth year] [id number]   Contacted patient in reference to medication adherence for [employer]. Patient states she is taking Atorvastatin and Losartan/HCTZ once daily as prescribed. Patient is on my spreadsheet to watch for possibly failing for 2018. In order to try to help with the patient's compliance I discussed her getting 3 month supplies on her medication's. With the patient's permission I contacted Dr. Garlon Hatchet and spoke with Angie about having new prescription's sent in to [facility] in [place]. Not only will this help with cost and convenience but hopefully it will increase patient adherence. I will continue to follow patient in the upcoming months.  [name], [place] [phone]

## 2017-02-15 ENCOUNTER — Other Ambulatory Visit: Payer: Self-pay | Admitting: Allergy and Immunology

## 2017-02-15 DIAGNOSIS — J4540 Moderate persistent asthma, uncomplicated: Secondary | ICD-10-CM | POA: Diagnosis not present

## 2017-02-15 DIAGNOSIS — J019 Acute sinusitis, unspecified: Secondary | ICD-10-CM | POA: Diagnosis not present

## 2017-02-15 NOTE — Telephone Encounter (Signed)
Patient needs office visit.  

## 2017-02-19 DIAGNOSIS — W57XXXA Bitten or stung by nonvenomous insect and other nonvenomous arthropods, initial encounter: Secondary | ICD-10-CM | POA: Diagnosis not present

## 2017-02-19 DIAGNOSIS — R300 Dysuria: Secondary | ICD-10-CM | POA: Diagnosis not present

## 2017-02-19 DIAGNOSIS — N3000 Acute cystitis without hematuria: Secondary | ICD-10-CM | POA: Diagnosis not present

## 2017-02-19 DIAGNOSIS — T148XXA Other injury of unspecified body region, initial encounter: Secondary | ICD-10-CM | POA: Diagnosis not present

## 2017-02-21 DIAGNOSIS — R1084 Generalized abdominal pain: Secondary | ICD-10-CM | POA: Diagnosis not present

## 2017-02-21 DIAGNOSIS — I10 Essential (primary) hypertension: Secondary | ICD-10-CM | POA: Diagnosis not present

## 2017-02-23 DIAGNOSIS — R1011 Right upper quadrant pain: Secondary | ICD-10-CM | POA: Diagnosis not present

## 2017-03-06 ENCOUNTER — Ambulatory Visit (INDEPENDENT_AMBULATORY_CARE_PROVIDER_SITE_OTHER): Payer: PPO | Admitting: General Surgery

## 2017-03-06 ENCOUNTER — Encounter: Payer: Self-pay | Admitting: General Surgery

## 2017-03-06 VITALS — BP 104/62 | HR 90 | Temp 96.6°F | Resp 16 | Ht 63.0 in | Wt 226.0 lb

## 2017-03-06 DIAGNOSIS — G8929 Other chronic pain: Secondary | ICD-10-CM

## 2017-03-06 DIAGNOSIS — R1011 Right upper quadrant pain: Secondary | ICD-10-CM
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
## 2017-03-06 NOTE — Patient Instructions (Signed)
Patient to have a ultrasound and HIDA scan.

## 2017-03-07 ENCOUNTER — Telehealth: Payer: Self-pay

## 2017-03-07 ENCOUNTER — Encounter: Payer: Self-pay | Admitting: General Surgery

## 2017-03-07 ENCOUNTER — Other Ambulatory Visit: Payer: Self-pay

## 2017-03-07 DIAGNOSIS — G8929 Other chronic pain: Secondary | ICD-10-CM

## 2017-03-07 DIAGNOSIS — R1011 Right upper quadrant pain: Principal | ICD-10-CM

## 2017-03-07 NOTE — Telephone Encounter (Signed)
Spoke with patient about having her Gallbladder Ultrasound. The patient is scheduled for this at [place] on 03/13/17 at 10:15 am. She will arrive by 10:00 am and have nothing to eat or drink for 4 hours prior. The patient is aware of date, time, and instructions.

## 2017-03-13 ENCOUNTER — Other Ambulatory Visit: Payer: Self-pay

## 2017-03-13 ENCOUNTER — Ambulatory Visit
Admission: RE | Admit: 2017-03-13 | Discharge: 2017-03-13 | Disposition: A | Discharge status: Home / Self Care | Payer: PPO | Source: Ambulatory Visit | Attending: General Surgery | Admitting: General Surgery

## 2017-03-13 DIAGNOSIS — R1011 Right upper quadrant pain: Secondary | ICD-10-CM | POA: Diagnosis not present

## 2017-03-13 DIAGNOSIS — G8929 Other chronic pain: Secondary | ICD-10-CM | POA: Insufficient documentation

## 2017-03-14 ENCOUNTER — Encounter: Payer: Self-pay | Admitting: *Deleted

## 2017-03-14 ENCOUNTER — Telehealth: Payer: Self-pay | Admitting: *Deleted

## 2017-03-14 NOTE — Telephone Encounter (Signed)
Patient has been scheduled for a HIDA scan at [place] [facility] for 03-23-17 at 7:30 am (arrive 7 am). Prep: NPO after midnight and do not take any pain medication the morning of.   Patient notified of the above but was driving. She requested we send her a My Chart message with the information. This has been done as requested.

## 2017-03-14 NOTE — Telephone Encounter (Signed)
-----   Message from [facility], LPN sent at 6/76/7209  4:49 PM EDT -----  I left a message with Loma Sousa with scheduling and had not heard back by the end of the day. The only day she cannot do is July 2nd. Otherwise the patient is open, just wants as soon as we can.   ----- Message ----- From: Robert Bellow, MD Sent: 03/13/2017   2:52 PM To: Lesly Rubenstein, LPN  Notify the patient to the ultrasound did not show evidence of gallstones. We can proceed to a HIDA scan (to be an with CCK not ensure) to determine if the gallbladder is the culprit. ----- Message ----- From: Interface, Rad Results In Sent: 03/13/2017  10:34 AM To: Robert Bellow, MD

## 2017-03-23 ENCOUNTER — Ambulatory Visit
Admission: RE | Admit: 2017-03-23 | Discharge: 2017-03-23 | Disposition: A | Discharge status: Home / Self Care | Payer: PPO | Source: Ambulatory Visit | Attending: General Surgery | Admitting: General Surgery

## 2017-03-23 DIAGNOSIS — R109 Unspecified abdominal pain: Secondary | ICD-10-CM | POA: Diagnosis not present

## 2017-03-23 DIAGNOSIS — R1011 Right upper quadrant pain: Secondary | ICD-10-CM | POA: Insufficient documentation

## 2017-03-23 DIAGNOSIS — G8929 Other chronic pain: Secondary | ICD-10-CM | POA: Diagnosis not present

## 2017-03-23 MED ORDER — TECHNETIUM TC 99M MEBROFENIN IV KIT
5.0000 | PACK | Freq: Once | INTRAVENOUS | Status: AC | PRN
  Administered 2017-03-23: 4.95 via INTRAVENOUS

## 2017-03-26 ENCOUNTER — Other Ambulatory Visit: Payer: Self-pay

## 2017-03-26 ENCOUNTER — Encounter: Payer: Self-pay | Admitting: *Deleted

## 2017-03-26 ENCOUNTER — Other Ambulatory Visit: Payer: Self-pay | Admitting: General Surgery

## 2017-03-26 DIAGNOSIS — R1011 Right upper quadrant pain: Principal | ICD-10-CM

## 2017-03-26 DIAGNOSIS — G8929 Other chronic pain: Secondary | ICD-10-CM

## 2017-03-26 NOTE — Progress Notes (Signed)
Fax sent to neurosurgeon on Friday, July 6. No response yet.    Will ask office to contact Dr. Eleanora Neighbor office at [facility] today to determine if patient may safely have laparoscopic surgery.  If no information received, the patient will be responsible to obtaining neurosurgery clearance from her physician (DR March Rummage).   Reported amylase of 23 is likely normal, depending on the laboratory.

## 2017-03-26 NOTE — Progress Notes (Signed)
Patient aware.

## 2017-03-26 NOTE — Progress Notes (Signed)
Patient's surgery has been scheduled for 04-09-17 at [facility].

## 2017-03-26 NOTE — Patient Outreach (Signed)
[facility]) Care Management  03/26/2017  [name] 11-21-1970 [id number]   Telephone Screen  Referral Date: 03/23/17 Referral Source: Nurse Call Center Report Referral Reason: "caller states she has an appt for diagnostic testing, says she is in chronic pain" Insurance: H[employer]   Outreach attempt # 1 to patient. Spoke with patient. She reports she is doing about the same. She voices that she continues to have abd. pain and discomfort. She states that her gallbladder surgery has been scheduled for 03/20/17. Patient has decreased appetite as well. RN CM offered suggestions to patient to help. She voices that she is not taking anything for pain as she has not been told that she can do so. Advised patient to contact MD office to inquire about that pain meds she is allowed to take. She reports that she already ha a call into the MD office regarding some pre op stuff and will discuss with nurse when she calls back. She voices no further RN CM needs or concerns at this time. Advised to contact 24hr Nurse Line number as needed. She voiced understanding.      Plan: RN CM will notify [facility] administrative assistant of case status.   Enzo Montgomery, RN,BSN,CCM [place] [facility] Telephonic Care Management Coordinator Direct Phone: [phone] Toll Free: [phone] Fax: [phone]

## 2017-03-28 ENCOUNTER — Encounter: Payer: Self-pay | Admitting: General Surgery

## 2017-03-28 NOTE — Progress Notes (Signed)
Patient called the office back to check and see if we had heard back from Dr. March Rummage yet as requested.   Per Dr. Bary Castilla we have not.   Patient notified.   She also states that labs that were drawn on 02-21-17 by her PCP, Dr. Garlon Hatchet showed her amylase was 23 IU/L. Patient is wondering if this is of any concern and if this needs further follow up.

## 2017-03-29 NOTE — Progress Notes (Signed)
Spoke with [name] the nurse for Dr March Rummage at [facility] and she said the Dr March Rummage said there is no contraindication for surgery with the VP shunt. He will be faxing over this in writing. The patient has been notified of such.

## 2017-04-02 ENCOUNTER — Ambulatory Visit
Admission: RE | Admit: 2017-04-02 | Discharge: 2017-04-02 | Disposition: A | Discharge status: Home / Self Care | Payer: PPO | Source: Ambulatory Visit | Attending: General Surgery | Admitting: General Surgery

## 2017-04-02 ENCOUNTER — Encounter
Admission: RE | Admit: 2017-04-02 | Discharge: 2017-04-02 | Disposition: A | Discharge status: Home / Self Care | Payer: PPO | Source: Ambulatory Visit | Attending: General Surgery | Admitting: General Surgery

## 2017-04-02 ENCOUNTER — Other Ambulatory Visit: Payer: Self-pay

## 2017-04-02 ENCOUNTER — Telehealth: Payer: Self-pay

## 2017-04-02 ENCOUNTER — Encounter: Payer: Self-pay | Admitting: Radiology

## 2017-04-02 DIAGNOSIS — Z01818 Encounter for other preprocedural examination: Secondary | ICD-10-CM | POA: Diagnosis not present

## 2017-04-02 DIAGNOSIS — R9431 Abnormal electrocardiogram [ECG] [EKG]: Secondary | ICD-10-CM | POA: Diagnosis not present

## 2017-04-02 DIAGNOSIS — Z982 Presence of cerebrospinal fluid drainage device: Secondary | ICD-10-CM | POA: Diagnosis not present

## 2017-04-02 DIAGNOSIS — I10 Essential (primary) hypertension: Secondary | ICD-10-CM | POA: Diagnosis not present

## 2017-04-02 DIAGNOSIS — Z01812 Encounter for preprocedural laboratory examination: Secondary | ICD-10-CM | POA: Diagnosis not present

## 2017-04-02 DIAGNOSIS — J45909 Unspecified asthma, uncomplicated: Secondary | ICD-10-CM | POA: Diagnosis not present

## 2017-04-02 DIAGNOSIS — Z462 Encounter for fitting and adjustment of other devices related to nervous system and special senses: Secondary | ICD-10-CM | POA: Diagnosis not present

## 2017-04-02 DIAGNOSIS — Z01810 Encounter for preprocedural cardiovascular examination: Secondary | ICD-10-CM | POA: Insufficient documentation

## 2017-04-02 HISTORY — DX: Unspecified convulsions: R56.9

## 2017-04-02 HISTORY — DX: Gastro-esophageal reflux disease without esophagitis: K21.9

## 2017-04-02 HISTORY — DX: Unspecified osteoarthritis, unspecified site: M19.90

## 2017-04-02 HISTORY — DX: Unspecified asthma, uncomplicated: J45.909

## 2017-04-02 LAB — CBC WITH DIFFERENTIAL/PLATELET
Basophils Absolute: 0.1 10*3/uL (ref 0–0.1)
Basophils Relative: 1 %
Eosinophils Absolute: 0.2 10*3/uL (ref 0–0.7)
Eosinophils Relative: 2 %
HCT: 38.9 % (ref 35.0–47.0)
Hemoglobin: 13.5 g/dL (ref 12.0–16.0)
Lymphocytes Relative: 28 %
Lymphs Abs: 2 10*3/uL (ref 1.0–3.6)
MCH: 29.7 pg (ref 26.0–34.0)
MCHC: 34.7 g/dL (ref 32.0–36.0)
MCV: 85.6 fL (ref 80.0–100.0)
Monocytes Absolute: 0.3 10*3/uL (ref 0.2–0.9)
Monocytes Relative: 4 %
Neutro Abs: 4.8 10*3/uL (ref 1.4–6.5)
Neutrophils Relative %: 65 %
Platelets: 261 10*3/uL (ref 150–440)
RBC: 4.54 MIL/uL (ref 3.80–5.20)
RDW: 14.9 % — ABNORMAL HIGH (ref 11.5–14.5)
WBC: 7.3 10*3/uL (ref 3.6–11.0)

## 2017-04-02 LAB — COMPREHENSIVE METABOLIC PANEL
ALBUMIN: 4.3 g/dL (ref 3.5–5.0)
ALK PHOS: 81 U/L (ref 38–126)
ALT: 28 U/L (ref 14–54)
ANION GAP: 10 (ref 5–15)
AST: 29 U/L (ref 15–41)
BUN: 7 mg/dL (ref 6–20)
CALCIUM: 9.8 mg/dL (ref 8.9–10.3)
CHLORIDE: 106 mmol/L (ref 101–111)
CO2: 27 mmol/L (ref 22–32)
CREATININE: 0.95 mg/dL (ref 0.44–1.00)
GFR calc Af Amer: >60 mL/min (ref >60)
GFR calc non Af Amer: >60 mL/min (ref >60)
GLUCOSE: 120 mg/dL — AB (ref 65–99)
Potassium: 2.7 mmol/L — CL (ref 3.5–5.1)
SODIUM: 143 mmol/L (ref 135–145)
Total Bilirubin: 0.6 mg/dL (ref 0.3–1.2)
Total Protein: 8 g/dL (ref 6.5–8.1)

## 2017-04-02 LAB — SURGICAL PCR SCREEN
MRSA, PCR: NEGATIVE
Staphylococcus aureus: NEGATIVE

## 2017-04-02 MED ORDER — POTASSIUM CHLORIDE 20 MEQ PO PACK: 20.0000 meq | PACK | Freq: Two times a day (BID) | ORAL | 0 refills | Status: DC

## 2017-04-02 NOTE — Telephone Encounter (Signed)
Patient notified of low Potassium and need for supplementation. Prescription sent into patient's pharmacy. Patient aware of directions.

## 2017-04-02 NOTE — Pre-Procedure Instructions (Signed)
Critical Potassium level of 2.7called to Dr. Dwyane Luo office, result given to [facility]. She will report the potassium level to Dr. Bary Castilla.

## 2017-04-02 NOTE — Patient Instructions (Signed)
  Your procedure is scheduled FW:YOVZCH April 09, 2017. Report to Same Day Surgery. To find out your arrival time please call [phone] between 1PM - 3PM on Friday April 06, 2017.  Remember: Instructions that are not followed completely may result in serious medical risk, up to and including death, or upon the discretion of your surgeon and anesthesiologist your surgery may need to be rescheduled.    _x___ 1. Do not eat food or drink liquids after midnight. No gum chewing or hard candies.     ____ 2. No Alcohol for 24 hours before or after surgery.   ____ 3. Bring all medications with you on the day of surgery if instructed.    __x__ 4. Notify your doctor if there is any change in your medical condition     (cold, fever, infections).    _____ 5. No smoking 24 hours prior to surgery.     Do not wear jewelry, make-up, hairpins, clips or nail polish.  Do not wear lotions, powders, or perfumes.   Do not shave 48 hours prior to surgery. Men may shave face and neck.  Do not bring valuables to the hospital.    [facility] is not responsible for any belongings or valuables.               Contacts, dentures or bridgework may not be worn into surgery.  Leave your suitcase in the car. After surgery it may be brought to your room.  For patients admitted to the hospital, discharge time is determined by your treatment team.   Patients discharged the day of surgery will not be allowed to drive home.    Please read over the following fact sheets that you were given:   [facility] Preparing for Surgery  __x__ Take these medicines the morning of surgery with A SIP OF WATER:    1. atorvastatin (LIPITOR)  2. buPROPion (WELLBUTRIN SR)  3. citalopram (CELEXA)  4. Fluticasone Furoate (ARNUITY ELLIPTA)  5. pantoprazole (PROTONIX)    ____ Fleet Enema (as directed)   _x___ Use CHG Soap as directed on instruction sheet  _x__ Use inhalers on the day of surgery and bring to hospital day of  surgery  ____ Stop metformin 2 days prior to surgery    ____ Take 1/2 of usual insulin dose the night before surgery and none on the morning of  surgery.   ____ Stop Coumadin/Plavix/aspirin on does not apply.  _x__ Stop Anti-inflammatories such as Advil, Aleve, Ibuprofen, Motrin, Naproxen, Naprosyn, Goodies powders or aspirin  products. OK to take Tylenol.   ____ Stop supplements until after surgery.    ____ Bring C-Pap to the hospital.

## 2017-04-02 NOTE — Telephone Encounter (Signed)
-----   Message from Robert Bellow, MD sent at 04/02/2017  2:40 PM EDT ----- Please send  RX for KCL, 20 mEq tablets, # 30, one tablet two  times a day between now and surgery.  Thanks.  ----- Message ----- From: Buel Ream, Lab In [place] Sent: 04/02/2017   1:17 PM To: Robert Bellow, MD

## 2017-04-03 ENCOUNTER — Telehealth: Payer: Self-pay

## 2017-04-03 NOTE — Pre-Procedure Instructions (Signed)
Met B and EKG sent to Anesthesia for review.

## 2017-04-03 NOTE — Pre-Procedure Instructions (Signed)
Met B results sent to Dr. Bary Castilla, questioned if need potassium supplement?

## 2017-04-03 NOTE — Pre-Procedure Instructions (Signed)
LABS/EKG ,AS REQUESTED FAXED FOR SUPPLEMENT AND CARDIAC CLEARANCE. NEEDS CARDIAC CLEARANCE. FAXED TO DR Dwyane Luo.

## 2017-04-03 NOTE — Telephone Encounter (Signed)
Per Judeen Hammans with [facility] Anesthesia the patient will need a cardiac clearance for surgery scheduled for 04/09/17. The patient is scheduled to see Dr Clayborn Bigness at [facility] on 04/05/17 at 10:30 am. The patient is aware of date, time, and location.

## 2017-04-04 DIAGNOSIS — H47033 Optic nerve hypoplasia, bilateral: Secondary | ICD-10-CM | POA: Diagnosis not present

## 2017-04-04 DIAGNOSIS — Z9622 Myringotomy tube(s) status: Secondary | ICD-10-CM | POA: Diagnosis not present

## 2017-04-05 DIAGNOSIS — R0602 Shortness of breath: Secondary | ICD-10-CM | POA: Diagnosis not present

## 2017-04-05 DIAGNOSIS — R569 Unspecified convulsions: Secondary | ICD-10-CM | POA: Diagnosis not present

## 2017-04-05 DIAGNOSIS — E669 Obesity, unspecified: Secondary | ICD-10-CM | POA: Diagnosis not present

## 2017-04-05 DIAGNOSIS — G919 Hydrocephalus, unspecified: Secondary | ICD-10-CM | POA: Diagnosis not present

## 2017-04-05 DIAGNOSIS — Z01818 Encounter for other preprocedural examination: Secondary | ICD-10-CM | POA: Diagnosis not present

## 2017-04-05 DIAGNOSIS — E782 Mixed hyperlipidemia: Secondary | ICD-10-CM | POA: Diagnosis not present

## 2017-04-05 DIAGNOSIS — I208 Other forms of angina pectoris: Secondary | ICD-10-CM | POA: Diagnosis not present

## 2017-04-05 DIAGNOSIS — K219 Gastro-esophageal reflux disease without esophagitis: Secondary | ICD-10-CM | POA: Diagnosis not present

## 2017-04-05 DIAGNOSIS — R51 Headache: Secondary | ICD-10-CM | POA: Diagnosis not present

## 2017-04-05 DIAGNOSIS — I10 Essential (primary) hypertension: Secondary | ICD-10-CM | POA: Diagnosis not present

## 2017-04-06 NOTE — Telephone Encounter (Signed)
Cardiac clearance received from Dr Etta Quill office. Clearance faxed over to Pre Admit testing.

## 2017-04-08 MED ORDER — CEFAZOLIN SODIUM-DEXTROSE 2-4 GM/100ML-% IV SOLN
2.0000 g | INTRAVENOUS | Status: AC
  Administered 2017-04-09: 2 g via INTRAVENOUS

## 2017-04-09 ENCOUNTER — Ambulatory Visit: Payer: PPO | Admitting: Certified Registered"

## 2017-04-09 ENCOUNTER — Ambulatory Visit
Admission: RE | Admit: 2017-04-09 | Discharge: 2017-04-09 | Disposition: A | Discharge status: Home / Self Care | Payer: PPO | Source: Ambulatory Visit | Attending: General Surgery | Admitting: General Surgery

## 2017-04-09 ENCOUNTER — Encounter
Admission: RE | Disposition: A | Discharge status: Home / Self Care | Payer: Self-pay | Source: Ambulatory Visit | Attending: General Surgery

## 2017-04-09 ENCOUNTER — Ambulatory Visit: Payer: PPO

## 2017-04-09 DIAGNOSIS — M329 Systemic lupus erythematosus, unspecified: Secondary | ICD-10-CM | POA: Insufficient documentation

## 2017-04-09 DIAGNOSIS — Z79899 Other long term (current) drug therapy: Secondary | ICD-10-CM | POA: Diagnosis not present

## 2017-04-09 DIAGNOSIS — J45909 Unspecified asthma, uncomplicated: Secondary | ICD-10-CM | POA: Diagnosis not present

## 2017-04-09 DIAGNOSIS — F419 Anxiety disorder, unspecified: Secondary | ICD-10-CM | POA: Insufficient documentation

## 2017-04-09 DIAGNOSIS — R1011 Right upper quadrant pain: Secondary | ICD-10-CM

## 2017-04-09 DIAGNOSIS — F329 Major depressive disorder, single episode, unspecified: Secondary | ICD-10-CM | POA: Insufficient documentation

## 2017-04-09 DIAGNOSIS — Z9889 Other specified postprocedural states: Secondary | ICD-10-CM | POA: Diagnosis not present

## 2017-04-09 DIAGNOSIS — K811 Chronic cholecystitis: Secondary | ICD-10-CM | POA: Insufficient documentation

## 2017-04-09 DIAGNOSIS — K8020 Calculus of gallbladder without cholecystitis without obstruction: Secondary | ICD-10-CM | POA: Diagnosis not present

## 2017-04-09 DIAGNOSIS — M419 Scoliosis, unspecified: Secondary | ICD-10-CM | POA: Insufficient documentation

## 2017-04-09 DIAGNOSIS — E785 Hyperlipidemia, unspecified: Secondary | ICD-10-CM | POA: Diagnosis not present

## 2017-04-09 DIAGNOSIS — K219 Gastro-esophageal reflux disease without esophagitis: Secondary | ICD-10-CM | POA: Insufficient documentation

## 2017-04-09 DIAGNOSIS — G8929 Other chronic pain: Secondary | ICD-10-CM

## 2017-04-09 DIAGNOSIS — Z880 Allergy status to penicillin: Secondary | ICD-10-CM | POA: Insufficient documentation

## 2017-04-09 DIAGNOSIS — K819 Cholecystitis, unspecified: Secondary | ICD-10-CM | POA: Diagnosis not present

## 2017-04-09 HISTORY — PX: CHOLECYSTECTOMY: SHX55

## 2017-04-09 LAB — POCT I-STAT 4, (NA,K, GLUC, HGB,HCT)
GLUCOSE: 104 mg/dL — AB (ref 65–99)
HCT: 35 % — ABNORMAL LOW (ref 36.0–46.0)
Hemoglobin: 11.9 g/dL — ABNORMAL LOW (ref 12.0–15.0)
POTASSIUM: 3.5 mmol/L (ref 3.5–5.1)
SODIUM: 143 mmol/L (ref 135–145)

## 2017-04-09 SURGERY — LAPAROSCOPIC CHOLECYSTECTOMY WITH INTRAOPERATIVE CHOLANGIOGRAM
Anesthesia: General | Wound class: Clean Contaminated

## 2017-04-09 MED ORDER — LABETALOL HCL 5 MG/ML IV SOLN
INTRAVENOUS | Status: AC
  Filled 2017-04-09: qty 4

## 2017-04-09 MED ORDER — HYDROMORPHONE HCL 1 MG/ML IJ SOLN
0.5000 mg | INTRAMUSCULAR | Status: AC | PRN
  Administered 2017-04-09 (×4): 0.5 mg via INTRAVENOUS

## 2017-04-09 MED ORDER — ROCURONIUM BROMIDE 50 MG/5ML IV SOLN
INTRAVENOUS | Status: AC
  Filled 2017-04-09: qty 1

## 2017-04-09 MED ORDER — ROCURONIUM BROMIDE 100 MG/10ML IV SOLN
INTRAVENOUS | Status: DC | PRN
  Administered 2017-04-09: 5 mg via INTRAVENOUS
  Administered 2017-04-09: 35 mg via INTRAVENOUS

## 2017-04-09 MED ORDER — ESMOLOL HCL 100 MG/10ML IV SOLN
INTRAVENOUS | Status: DC | PRN
  Administered 2017-04-09: 50 mg via INTRAVENOUS

## 2017-04-09 MED ORDER — FENTANYL CITRATE (PF) 100 MCG/2ML IJ SOLN
INTRAMUSCULAR | Status: AC
  Filled 2017-04-09: qty 2

## 2017-04-09 MED ORDER — MIDAZOLAM HCL 2 MG/2ML IJ SOLN
INTRAMUSCULAR | Status: DC | PRN
  Administered 2017-04-09: 2 mg via INTRAVENOUS

## 2017-04-09 MED ORDER — PROPOFOL 10 MG/ML IV BOLUS
INTRAVENOUS | Status: DC | PRN
  Administered 2017-04-09: 160 mg via INTRAVENOUS

## 2017-04-09 MED ORDER — IOTHALAMATE MEGLUMINE 60 % INJ SOLN
INTRAMUSCULAR | Status: DC | PRN
  Administered 2017-04-09: 11 mL

## 2017-04-09 MED ORDER — PROMETHAZINE HCL 25 MG/ML IJ SOLN: 6.2500 mg | INTRAMUSCULAR | Status: DC | PRN

## 2017-04-09 MED ORDER — FENTANYL CITRATE (PF) 100 MCG/2ML IJ SOLN
25.0000 ug | INTRAMUSCULAR | Status: DC | PRN
  Administered 2017-04-09 (×3): 50 ug via INTRAVENOUS

## 2017-04-09 MED ORDER — PROPOFOL 10 MG/ML IV BOLUS
INTRAVENOUS | Status: AC
  Filled 2017-04-09: qty 20

## 2017-04-09 MED ORDER — ACETAMINOPHEN 10 MG/ML IV SOLN
INTRAVENOUS | Status: DC | PRN
  Administered 2017-04-09: 1000 mg via INTRAVENOUS

## 2017-04-09 MED ORDER — DEXAMETHASONE SODIUM PHOSPHATE 10 MG/ML IJ SOLN
INTRAMUSCULAR | Status: DC | PRN
  Administered 2017-04-09: 4 mg via INTRAVENOUS

## 2017-04-09 MED ORDER — ACETAMINOPHEN 10 MG/ML IV SOLN
INTRAVENOUS | Status: AC
  Filled 2017-04-09: qty 100

## 2017-04-09 MED ORDER — LIDOCAINE HCL (CARDIAC) 20 MG/ML IV SOLN
INTRAVENOUS | Status: DC | PRN
  Administered 2017-04-09: 100 mg via INTRAVENOUS

## 2017-04-09 MED ORDER — DEXAMETHASONE SODIUM PHOSPHATE 10 MG/ML IJ SOLN
INTRAMUSCULAR | Status: AC
  Filled 2017-04-09: qty 1

## 2017-04-09 MED ORDER — MIDAZOLAM HCL 2 MG/2ML IJ SOLN
INTRAMUSCULAR | Status: AC
  Filled 2017-04-09: qty 2

## 2017-04-09 MED ORDER — SUGAMMADEX SODIUM 200 MG/2ML IV SOLN
INTRAVENOUS | Status: DC | PRN
  Administered 2017-04-09: 200 mg via INTRAVENOUS

## 2017-04-09 MED ORDER — ONDANSETRON HCL 4 MG/2ML IJ SOLN
INTRAMUSCULAR | Status: DC | PRN
  Administered 2017-04-09: 4 mg via INTRAVENOUS

## 2017-04-09 MED ORDER — POTASSIUM CHLORIDE ER 20 MEQ PO TBCR: 20.0000 meq | EXTENDED_RELEASE_TABLET | Freq: Every day | ORAL | 0 refills | Status: DC

## 2017-04-09 MED ORDER — ONDANSETRON HCL 4 MG/2ML IJ SOLN
INTRAMUSCULAR | Status: AC
  Filled 2017-04-09: qty 2

## 2017-04-09 MED ORDER — SODIUM CHLORIDE 0.9 % IJ SOLN
INTRAMUSCULAR | Status: AC
  Filled 2017-04-09: qty 50

## 2017-04-09 MED ORDER — SUGAMMADEX SODIUM 200 MG/2ML IV SOLN
INTRAVENOUS | Status: AC
  Filled 2017-04-09: qty 2

## 2017-04-09 MED ORDER — TRAMADOL HCL 50 MG PO TABS: 50.0000 mg | ORAL_TABLET | ORAL | 0 refills | Status: DC | PRN

## 2017-04-09 MED ORDER — CEFAZOLIN SODIUM-DEXTROSE 2-4 GM/100ML-% IV SOLN
INTRAVENOUS | Status: AC
  Filled 2017-04-09: qty 100

## 2017-04-09 MED ORDER — HYDROMORPHONE HCL 1 MG/ML IJ SOLN
INTRAMUSCULAR | Status: AC
  Filled 2017-04-09: qty 1

## 2017-04-09 MED ORDER — FENTANYL CITRATE (PF) 100 MCG/2ML IJ SOLN
INTRAMUSCULAR | Status: DC | PRN
  Administered 2017-04-09: 100 ug via INTRAVENOUS
  Administered 2017-04-09: 50 ug via INTRAVENOUS
  Administered 2017-04-09: 25 ug via INTRAVENOUS

## 2017-04-09 MED ORDER — SUCCINYLCHOLINE CHLORIDE 20 MG/ML IJ SOLN
INTRAMUSCULAR | Status: DC | PRN
  Administered 2017-04-09: 100 mg via INTRAVENOUS

## 2017-04-09 MED ORDER — LIDOCAINE HCL (PF) 2 % IJ SOLN
INTRAMUSCULAR | Status: AC
  Filled 2017-04-09: qty 2

## 2017-04-09 MED ORDER — LABETALOL HCL 5 MG/ML IV SOLN
INTRAVENOUS | Status: DC | PRN
  Administered 2017-04-09: 5 mg via INTRAVENOUS

## 2017-04-09 MED ORDER — LACTATED RINGERS IV SOLN
INTRAVENOUS | Status: DC
  Administered 2017-04-09: 13:00:00 via INTRAVENOUS

## 2017-04-09 SURGICAL SUPPLY — 42 items
APPLIER CLIP ROT 10 11.4 M/L (STAPLE) ×3
BAG SPEC RTRVL LRG 6X4 10 (ENDOMECHANICALS) ×1
BLADE SURG 11 STRL SS SAFETY (MISCELLANEOUS) ×3 IMPLANT
CANISTER SUCT 1200ML W/VALVE (MISCELLANEOUS) ×3 IMPLANT
CANNULA DILATOR  5MM W/SLV (CANNULA) ×2
CANNULA DILATOR 10 W/SLV (CANNULA) ×2 IMPLANT
CANNULA DILATOR 10MM W/SLV (CANNULA) ×1
CANNULA DILATOR 5 W/SLV (CANNULA) ×4 IMPLANT
CATH CHOLANG 76X19 KUMAR (CATHETERS) ×3 IMPLANT
CHLORAPREP W/TINT 26ML (MISCELLANEOUS) ×3 IMPLANT
CLIP APPLIE ROT 10 11.4 M/L (STAPLE) ×1 IMPLANT
CLOSURE WOUND 1/2 X4 (GAUZE/BANDAGES/DRESSINGS) ×1
CONRAY 60ML FOR OR (MISCELLANEOUS) ×3 IMPLANT
DISSECTOR KITTNER STICK (MISCELLANEOUS) ×1 IMPLANT
DISSECTORS/KITTNER STICK (MISCELLANEOUS) ×3
DRAPE SHEET LG 3/4 BI-LAMINATE (DRAPES) ×3 IMPLANT
DRSG TEGADERM 2-3/8X2-3/4 SM (GAUZE/BANDAGES/DRESSINGS) ×12 IMPLANT
DRSG TELFA 4X3 1S NADH ST (GAUZE/BANDAGES/DRESSINGS) ×3 IMPLANT
ELECT REM PT RETURN 9FT ADLT (ELECTROSURGICAL) ×3
ELECTRODE REM PT RTRN 9FT ADLT (ELECTROSURGICAL) ×1 IMPLANT
GLOVE BIO SURGEON STRL SZ7.5 (GLOVE) ×3 IMPLANT
GLOVE INDICATOR 8.0 STRL GRN (GLOVE) ×3 IMPLANT
GOWN STRL REUS W/ TWL LRG LVL3 (GOWN DISPOSABLE) ×3 IMPLANT
GOWN STRL REUS W/TWL LRG LVL3 (GOWN DISPOSABLE) ×9
IRRIGATION STRYKERFLOW (MISCELLANEOUS) ×1 IMPLANT
IRRIGATOR STRYKERFLOW (MISCELLANEOUS) ×3
IV LACTATED RINGERS 1000ML (IV SOLUTION) ×3 IMPLANT
KIT RM TURNOVER STRD PROC AR (KITS) ×3 IMPLANT
LABEL OR SOLS (LABEL) ×3 IMPLANT
NDL INSUFF ACCESS 14 VERSASTEP (NEEDLE) ×3 IMPLANT
NS IRRIG 500ML POUR BTL (IV SOLUTION) ×3 IMPLANT
PACK LAP CHOLECYSTECTOMY (MISCELLANEOUS) ×3 IMPLANT
POUCH SPECIMEN RETRIEVAL 10MM (ENDOMECHANICALS) ×3 IMPLANT
SCISSORS METZENBAUM CVD 33 (INSTRUMENTS) ×3 IMPLANT
SEAL FOR SCOPE WARMER C3101 (MISCELLANEOUS) ×3 IMPLANT
STRIP CLOSURE SKIN 1/2X4 (GAUZE/BANDAGES/DRESSINGS) ×2 IMPLANT
SUT VIC AB 0 CT2 27 (SUTURE) ×3 IMPLANT
SUT VIC AB 4-0 FS2 27 (SUTURE) ×3 IMPLANT
SWABSTK COMLB BENZOIN TINCTURE (MISCELLANEOUS) ×3 IMPLANT
TROCAR XCEL NON-BLD 11X100MML (ENDOMECHANICALS) ×3 IMPLANT
TUBING INSUFFLATOR HI FLOW (MISCELLANEOUS) ×3 IMPLANT
WATER STERILE IRR 1000ML POUR (IV SOLUTION) ×3 IMPLANT

## 2017-04-09 NOTE — Anesthesia Preprocedure Evaluation (Signed)
Anesthesia Evaluation  Patient identified by MRN, date of birth, ID band Patient awake    Reviewed: Allergy & Precautions, H&P , NPO status , Patient's Chart, lab work & pertinent test results, reviewed documented beta blocker date and time   History of Anesthesia Complications Negative for: history of anesthetic complications  Airway Mallampati: II  TM Distance: >3 FB Neck ROM: full    Dental  (+) Edentulous Upper, Edentulous Lower, Upper Dentures, Lower Dentures   Pulmonary neg shortness of breath, asthma , neg sleep apnea, neg COPD, neg recent URI,           Cardiovascular Exercise Tolerance: Good (-) hypertension(-) angina(-) CAD, (-) Past MI, (-) Cardiac Stents and (-) CABG + dysrhythmias (-) Valvular Problems/Murmurs     Neuro/Psych Seizures -,  PSYCHIATRIC DISORDERS (Depression) VP shunt in place    GI/Hepatic Neg liver ROS, GERD  Medicated and Controlled,  Endo/Other  neg diabetesMorbid obesity  Renal/GU negative Renal ROS  negative genitourinary   Musculoskeletal   Abdominal   Peds  Hematology negative hematology ROS (+)   Anesthesia Other Findings Past Medical History: No date: Anxiety No date: Arthritis No date: Asthma No date: Depression No date: GERD (gastroesophageal reflux disease) 2015: H. pylori infection No date: Herpes genitalia No date: Hyperlipidemia No date: Lupus     Comment:  Dr. Lucky Cowboy informed pt she does not have lupus No date: Seizures ([place])     Comment:  last seizures 4-5 years ago. July 2017: Subdural hematoma ([place])     Comment:  Bilateral   Reproductive/Obstetrics negative OB ROS                             Anesthesia Physical Anesthesia Plan  ASA: III  Anesthesia Plan: General   Post-op Pain Management:    Induction: Intravenous  PONV Risk Score and Plan: 3 and Ondansetron, Dexamethasone, Propofol and Midazolam  Airway Management Planned:  Oral ETT  Additional Equipment:   Intra-op Plan:   Post-operative Plan: Extubation in OR  Informed Consent: I have reviewed the patients History and Physical, chart, labs and discussed the procedure including the risks, benefits and alternatives for the proposed anesthesia with the patient or authorized representative who has indicated his/her understanding and acceptance.   Dental Advisory Given  Plan Discussed with: Anesthesiologist, CRNA and Surgeon  Anesthesia Plan Comments:         Anesthesia Quick Evaluation

## 2017-04-09 NOTE — Op Note (Signed)
Preoperative diagnosis: Chronic right upper quadrant pain, hyperfunctioning gallbladder.  Postoperative diagnosis: Same.  Operative procedure: Laparoscopic cholecystectomy with intraoperative cholangiograms.  Operating surgeon: Ollen Bowl, [name]  Anesthesia: Gen. endotracheal.  Estimated blood loss: 5-10 mL.  Clinical note: This 46 year old his long history of right upper quadrant pain. Prior ultrasounds have been negative. HIDA scans have shown a hyperfunctioning gallbladder with reproduction of her symptoms. She had previously undergone upper and lower endoscopy in spring 2018 without etiology for her pain. She was considered a candidate for cholecystectomy.  Operative note: With the patient under adequate general endotracheal anesthesia the abdomen was prepped with ChloraPrep and draped. An Trendelenburg position a varies needle was placed through a trans-umbilical incision. After assuring intra-abdominal location with angled drop test the abdomen was insufflated with CO2 a 10 mmHg pressure. There was no evidence of injury from initial port placement of a 10 mm Step port.  The patient was placed in reverse Trendelenburg position and rolled to the left. Her ventriculoperitoneal shunt was evident in the right upper quadrant draped across the transverse colon. With care the adhesions around the shunt were taken down with careful cautery dissection. The shunt was freed from the abdominal wall to allow to be repositioned outside the field of view. 2-5 mm Step ports were then placed in the right lateral abdominal wall below the shunt entry point. The gallbladder was noted to be floppy with scarring near the neck. The cystic duct was dissected free. A Kumar clamp was placed and 11 mL of one half strength Conray 60 was used for cholangiograms. This showed free flow to the duodenum and reflux into the proximal biliary tree. The cystic duct and cystic artery were doubly clipped and divided. The  gallbladder is removed from the liver bed making use of hook cautery dissection. It was delivered into an Endo Catch bag and then through the umbilical port site. The opened gallbladder showed mild changes of chronic cholecystitis in the mucosa but no stones.  After reestablishing pneumoperitoneum right upper quadrant was irrigated with lactated Ringer's solution. There was good hemostasis were the omentum had been adherent to the anterior abdominal wall. The area of clip application was inspected and found to be acceptable. The abdomen was then desufflated and ports were removed under direct vision. The fascia at the umbilicus was closed with a simple 0 Surgilon suture. Skin incisions were closed with a 4-0 Vicryl subcuticular suture. Benzoin, Steri-Strips followed by Telfa and Tegaderm dressings were applied.  The patient tolerated the procedure well and was taken to recovery room in stable condition.

## 2017-04-09 NOTE — H&P (Signed)
[name] [id number] Jan 18, 1971     HPI: Right upper quadrant pain for several months. Evaluations been unremarkable except in regards to the biliary tree. Very high ejection fraction noted with CCK. Upper and lower endoscopy March 2018 unremarkable. Repeat HIDA scan with stimulation reproduced her right upper quadrant pain. The patient is aware that cholecystectomy may not relieve all of her symptoms.  Prescriptions Prior to Admission  Medication Sig Dispense Refill Last Dose  . albuterol (PROAIR HFA) 108 (90 Base) MCG/ACT inhaler Inhale two puffs every four to six hours as needed for cough or wheeze. 1 Inhaler 1 04/09/2017 at Unknown time  . atorvastatin (LIPITOR) 40 MG tablet Take 40 mg by mouth daily. In am.   04/08/2017 at Unknown time  . buPROPion (WELLBUTRIN SR) 100 MG 12 hr tablet Take 100 mg by mouth daily. In am   04/09/2017 at Unknown time  . cetirizine (ZYRTEC) 10 MG tablet Take 10 mg by mouth daily. In am   04/09/2017 at Unknown time  . citalopram (CELEXA) 20 MG tablet Take 20 mg by mouth daily. In am.   04/09/2017 at Unknown time  . Fluticasone Furoate (ARNUITY ELLIPTA) 200 MCG/ACT AEPB Inhale 1 Dose into the lungs daily. (Patient taking differently: Inhale 1 puff into the lungs daily. ) 30 each 5 04/09/2017 at Unknown time  . losartan-hydrochlorothiazide (HYZAAR) 50-12.5 MG tablet Take one tablet once daily 90 tablet 0 04/08/2017 at Unknown time  . montelukast (SINGULAIR) 10 MG tablet TAKE ONE TABLET BY MOUTH AT BEDTIME 30 tablet 0 04/08/2017 at Unknown time  . pantoprazole (PROTONIX) 40 MG tablet TAKE ONE TABLET BY MOUTH ONCE DAILY 30 tablet 0 04/09/2017 at Unknown time  . potassium chloride (KLOR-CON) 20 MEQ packet Take 20 mEq by mouth 2 (two) times daily. 30 tablet 0 04/08/2017 at Unknown time  . ranitidine (ZANTAC) 300 MG tablet Take 1 tablet (300 mg total) by mouth at bedtime. 30 tablet 5 04/08/2017 at Unknown time  . rizatriptan (MAXALT) 10 MG tablet Take 1 tablet by mouth daily  as needed for migraine.    Past Month at Unknown time  . topiramate (TOPAMAX) 100 MG tablet Take 100 mg by mouth daily.    04/08/2017 at Unknown time  . valACYclovir (VALTREX) 500 MG tablet Take 500 mg by mouth daily.    04/08/2017 at Unknown time  . levETIRAcetam (KEPPRA) 500 MG tablet Take 500 mg by mouth.   Taking   Allergies  Allergen Reactions  . Levofloxacin Anaphylaxis  . Penicillins Shortness Of Breath and Rash    Has patient had a PCN reaction causing immediate rash, facial/tongue/throat swelling, SOB or lightheadedness with hypotension: Yes Has patient had a PCN reaction causing severe rash involving mucus membranes or skin necrosis: Unknown Has patient had a PCN reaction that required hospitalization: No Has patient had a PCN reaction occurring within the last 10 years: No If all of the above answers are "NO", then may proceed with Cephalosporin use.   . Sumatriptan Rash  . Clindamycin Diarrhea  . Codeine Swelling  . Diclofenac Potassium     Other reaction(s): GI Upset (intolerance)  . Sulfa Antibiotics Swelling   Past Medical History:  Diagnosis Date  . Anxiety   . Arthritis   . Asthma   . Depression   . GERD (gastroesophageal reflux disease)   . H. pylori infection 2015  . Herpes genitalia   . Hyperlipidemia   . Lupus    Dr. Lucky Cowboy informed pt she does not  have lupus  . Seizures ([place])    last seizures 4-5 years ago.  . 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 ABDOMINAL HYSTERECTOMY  2008  . BRAIN SURGERY  02/2016   removal of 2 blood clotts from the brain.  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 BREAST EXCISIONAL BIOPSY Left 01/2014   lymph node  . BREAST SURGERY Left 02/04/14   left core bx identifying a fibroadenoma and excision of left axillary lymph node, benign  . BURR HOLE FOR SUBDURAL HEMATOMA  March 18, 2016  . COLONOSCOPY  12/21/2015  . COLONOSCOPY W/ BIOPSIES  12/08/2016    Tubular adenoma the sigmoid. No dysplasia. Benign colonic biopsies without evidence of colitis. [name], [name] [facility]  . FRACTURE SURGERY Right 2005   hand  . LAPAROSCOPIC REVISION VENTRICULAR-PERITONEAL (V-P) SHUNT  2000  . LEG SURGERY Left 2002  . NECK SURGERY  1985  . POSTERIOR LAMINECTOMY THORACIC AND LUMBAR SPINE Bilateral 1985   Scoliosis stabilization  . SHUNT REVISION  2003 & July 2017  . SPINE SURGERY  1985   Scoliosis  . TUBAL LIGATION  1995  . UPPER GI ENDOSCOPY  12/08/2016   Hypertrophic gastric polyp, mild chronic gastritis. No evidence of H. pylori. [name], [name], [facility]   Social History   Social History  . Marital status: Married    Spouse name: N/A  . Number of children: N/A  . Years of education: N/A   Occupational History  . Not on file.   Social History Main Topics  . Smoking status: Never Smoker  . Smokeless tobacco: Never Used  . Alcohol use No  . Drug use: No  . Sexual activity: Not on file   Other Topics Concern  . Not on file   Social History Narrative  . No narrative on file   Social History   Social History Narrative  . No narrative on file     ROS: Negative.   Laboratory: Potassium up to 3.5 with oral supplementation.  Plans for laparoscopic procedure discussed with Dr. March Rummage from neurosurgery at [facility]. No contraindication.   PE: HEENT: Negative. Lungs: Clear. Cardio: RR.  Assessment/Plan:  Proceed with planned cholecystectomy  Robert Bellow 04/09/2017

## 2017-04-09 NOTE — Discharge Instructions (Signed)

## 2017-04-09 NOTE — Anesthesia Procedure Notes (Signed)
Procedure Name: Intubation Performed by: Javarian Jakubiak Pre-anesthesia Checklist: Patient identified, Patient being monitored, Timeout performed, Emergency Drugs available and Suction available Patient Re-evaluated:Patient Re-evaluated prior to induction Oxygen Delivery Method: Circle system utilized Preoxygenation: Pre-oxygenation with 100% oxygen Induction Type: IV induction Ventilation: Mask ventilation without difficulty and Oral airway inserted - appropriate to patient size Laryngoscope Size: Mac and 3 Grade View: Grade I Tube type: Oral Tube size: 7.0 mm Number of attempts: 1 Airway Equipment and Method: Stylet Placement Confirmation: ETT inserted through vocal cords under direct vision,  positive ETCO2 and breath sounds checked- equal and bilateral Secured at: 22 cm Tube secured with: Tape Dental Injury: Teeth and Oropharynx as per pre-operative assessment        

## 2017-04-09 NOTE — Transfer of Care (Signed)
Immediate Anesthesia Transfer of Care Note  Patient: [name]  Procedure(s) Performed: Procedure(s): LAPAROSCOPIC CHOLECYSTECTOMY WITH INTRAOPERATIVE CHOLANGIOGRAM (N/A)  Patient Location: PACU  Anesthesia Type:General  Level of Consciousness: sedated and responds to stimulation  Airway & Oxygen Therapy: Patient Spontanous Breathing and Patient connected to face mask oxygen  Post-op Assessment: Report given to RN and Post -op Vital signs reviewed and stable  Post vital signs: Reviewed and stable  Last Vitals:  Vitals:   04/09/17 1457 04/09/17 1458  BP: 127/85 127/85  Pulse: 77 76  Resp: 14 18  Temp: 36.8 C     Last Pain:  Vitals:   04/09/17 1217  TempSrc: Oral         Complications: No apparent anesthesia complications

## 2017-04-09 NOTE — Anesthesia Post-op Follow-up Note (Cosign Needed)
Anesthesia QCDR form completed.        

## 2017-04-10 ENCOUNTER — Encounter: Payer: Self-pay | Admitting: General Surgery

## 2017-04-10 NOTE — Anesthesia Postprocedure Evaluation (Signed)
Anesthesia Post Note  Patient: [name]  Procedure(s) Performed: Procedure(s) (LRB): LAPAROSCOPIC CHOLECYSTECTOMY WITH INTRAOPERATIVE CHOLANGIOGRAM (N/A)  Patient location during evaluation: PACU Anesthesia Type: General Level of consciousness: awake and alert Pain management: pain level controlled Vital Signs Assessment: post-procedure vital signs reviewed and stable Respiratory status: spontaneous breathing, nonlabored ventilation, respiratory function stable and patient connected to nasal cannula oxygen Cardiovascular status: blood pressure returned to baseline and stable Postop Assessment: no signs of nausea or vomiting Anesthetic complications: no     Last Vitals:  Vitals:   04/09/17 1623 04/09/17 1730  BP: (!) 107/59 124/69  Pulse: 79 75  Resp: 16 16  Temp: 36.7 C     Last Pain:  Vitals:   04/09/17 1730  TempSrc:   PainSc: 2                  Martha Clan

## 2017-04-11 ENCOUNTER — Other Ambulatory Visit: Payer: Self-pay | Admitting: Pharmacy Technician

## 2017-04-11 LAB — SURGICAL PATHOLOGY

## 2017-04-11 NOTE — Patient Outreach (Signed)
[facility]) Care Management  04/11/2017  DODY SMARTT 1970/10/28 [id number]   I contacted patient today in reference to medication adherence for [employer]. The patient did not wish to discuss her medication's with me over the phone but she did state that she takes her medication's daily and does not forget any doses.  Doreene Burke, [place] [phone]

## 2017-04-16 ENCOUNTER — Ambulatory Visit (INDEPENDENT_AMBULATORY_CARE_PROVIDER_SITE_OTHER): Payer: PPO | Admitting: General Surgery

## 2017-04-16 VITALS — BP 132/70 | HR 88 | Resp 12 | Ht 63.0 in | Wt 226.0 lb

## 2017-04-16 DIAGNOSIS — R1011 Right upper quadrant pain: Secondary | ICD-10-CM

## 2017-04-16 NOTE — Progress Notes (Signed)
Patient ID: [name], female   DOB: 03-21-1971, 46 y.o.   MRN: [id number]  Chief Complaint  Patient presents with  . Routine Post Op    Cholecystectomy    HPI [name] is a 46 y.o. female is here for a one week post op cholecystectomy done 04/09/17.The marked right upper quadrant pain she previously experienced has resolved. She states she is doing well, still a little sore. Moving bowels ok-she is taking colace to help with constipation. She is eating normal.  Her husband [name] is present. The patient was found to have a band of adhesions approximately 5-6 m in length extending from the entrance point of the ventriculoperitoneal shunt to the midline. These were lysed to allow access to the biliary tree at time of cholecystectomy.  HPI  Past Medical History:  Diagnosis Date  . Anxiety   . Arthritis   . Asthma   . Depression   . GERD (gastroesophageal reflux disease)   . H. pylori infection 2015  . Herpes genitalia   . Hyperlipidemia   . Lupus    Dr. Lucky Cowboy informed pt she does not have lupus  . Seizures ([place])    last seizures 4-5 years ago.  . 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 ABDOMINAL HYSTERECTOMY  2008  . BRAIN SURGERY  02/2016   removal of 2 blood clotts from the brain.  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 BREAST EXCISIONAL BIOPSY Left 01/2014   lymph node  . BREAST SURGERY Left 02/04/14   left core bx identifying a fibroadenoma and excision of left axillary lymph node, benign  . BURR HOLE FOR SUBDURAL HEMATOMA  March 18, 2016  . CHOLECYSTECTOMY N/A 04/09/2017   Procedure: LAPAROSCOPIC CHOLECYSTECTOMY WITH INTRAOPERATIVE CHOLANGIOGRAM;  Surgeon: Robert Bellow, MD;  Location: [facility] ORS;  Service: General;  Laterality: N/A;  . COLONOSCOPY  12/21/2015  . COLONOSCOPY W/ BIOPSIES  12/08/2016   Tubular adenoma the sigmoid. No dysplasia. Benign colonic biopsies without  evidence of colitis. [name], [name] [facility]  . FRACTURE SURGERY Right 2005   hand  . LAPAROSCOPIC REVISION VENTRICULAR-PERITONEAL (V-P) SHUNT  2000  . LEG SURGERY Left 2002  . NECK SURGERY  1985  . POSTERIOR LAMINECTOMY THORACIC AND LUMBAR SPINE Bilateral 1985   Scoliosis stabilization  . SHUNT REVISION  2003 & July 2017  . SPINE SURGERY  1985   Scoliosis  . TUBAL LIGATION  1995  . UPPER GI ENDOSCOPY  12/08/2016   Hypertrophic gastric polyp, mild chronic gastritis. No evidence of H. pylori. [name], [name], [facility]    Family History  Problem Relation Age of Onset  . Leukemia Mother 22  . Colon polyps Mother   . Lung cancer Father   . Leukemia Maternal Aunt   . Brain cancer Sister     Social History Social History  Substance Use Topics  . Smoking status: Never Smoker  . Smokeless tobacco: Never Used  . Alcohol use No    Allergies  Allergen Reactions  . Levofloxacin Anaphylaxis  . Penicillins Shortness Of Breath and Rash    Has patient had a PCN reaction causing immediate rash, facial/tongue/throat swelling, SOB or lightheadedness with hypotension: Yes Has patient had a PCN reaction causing severe rash involving mucus membranes or skin necrosis: Unknown Has patient had a PCN reaction that required hospitalization: No Has patient had a  PCN reaction occurring within the last 10 years: No If all of the above answers are "NO", then may proceed with Cephalosporin use.   . Sumatriptan Rash  . Clindamycin Diarrhea  . Codeine Swelling  . Diclofenac Potassium     Other reaction(s): GI Upset (intolerance)  . Sulfa Antibiotics Swelling    Current Outpatient Prescriptions  Medication Sig Dispense Refill  . albuterol (PROAIR HFA) 108 (90 Base) MCG/ACT inhaler Inhale two puffs every four to six hours as needed for cough or wheeze. 1 Inhaler 1  . atorvastatin (LIPITOR) 40 MG tablet Take 40 mg by mouth daily. In am.    . buPROPion  (WELLBUTRIN SR) 100 MG 12 hr tablet Take 100 mg by mouth daily. In am    . cetirizine (ZYRTEC) 10 MG tablet Take 10 mg by mouth daily. In am    . citalopram (CELEXA) 20 MG tablet Take 20 mg by mouth daily. In am.    . Fluticasone Furoate (ARNUITY ELLIPTA) 200 MCG/ACT AEPB Inhale 1 Dose into the lungs daily. (Patient taking differently: Inhale 1 puff into the lungs daily. ) 30 each 5  . losartan-hydrochlorothiazide (HYZAAR) 50-12.5 MG tablet Take one tablet once daily 90 tablet 0  . montelukast (SINGULAIR) 10 MG tablet TAKE ONE TABLET BY MOUTH AT BEDTIME 30 tablet 0  . pantoprazole (PROTONIX) 40 MG tablet TAKE ONE TABLET BY MOUTH ONCE DAILY 30 tablet 0  . potassium chloride 20 MEQ TBCR Take 20 mEq by mouth daily. 30 tablet 0  . ranitidine (ZANTAC) 300 MG tablet Take 1 tablet (300 mg total) by mouth at bedtime. 30 tablet 5  . rizatriptan (MAXALT) 10 MG tablet Take 1 tablet by mouth daily as needed for migraine.     . topiramate (TOPAMAX) 100 MG tablet Take 100 mg by mouth daily.     . valACYclovir (VALTREX) 500 MG tablet Take 500 mg by mouth daily.     [name] levETIRAcetam (KEPPRA) 500 MG tablet Take 500 mg by mouth.     No current facility-administered medications for this visit.     Review of Systems Review of Systems  Constitutional: Negative.   Respiratory: Negative.   Cardiovascular: Negative.   Gastrointestinal: Negative.     Blood pressure 132/70, pulse 88, resp. rate 12, height 5\' 3"  (1.6 m), weight 226 lb (102.5 kg).  Physical Exam Physical Exam  Constitutional: She is oriented to person, place, and time. She appears well-developed and well-nourished.  Cardiovascular: Normal rate, regular rhythm and normal heart sounds.   Pulmonary/Chest: Effort normal and breath sounds normal.  Abdominal:  Minimal bruising upper abdomen, port sites healing well  Neurological: She is alert and oriented to person, place, and time.  Skin: Skin is warm and dry.    Data Reviewed Pathology from  the 04/09/2017 cholecystectomy specimen: DIAGNOSIS:  A. GALLBLADDER; CHOLECYSTECTOMY:  - MINIMAL CHRONIC CHOLECYSTITIS.    Assessment    Resolution of right upper quadrant pain likely secondary to lysis of adhesions around the JP shunt, less related to biliary tract.    Plan        Increase activity as tolerated Proper lifting techniques reviewed.   HPI, Physical Exam, Assessment and Plan have been scribed under the direction and in the presence of Hervey Ard, MD.  Verlene Mayer, CMA  I have completed the exam and reviewed the above documentation for accuracy and completeness.  I agree with the above.  Haematologist has been used and any errors in dictation or transcription are  unintentional.  Hervey Ard, [name], F.A.C.S.     Robert Bellow 04/17/2017, 6:44 AM

## 2017-04-16 NOTE — Patient Instructions (Addendum)
Increase activity as can tolerate.

## 2017-05-01 DIAGNOSIS — I208 Other forms of angina pectoris: Secondary | ICD-10-CM | POA: Diagnosis not present

## 2017-05-01 DIAGNOSIS — R0602 Shortness of breath: Secondary | ICD-10-CM | POA: Diagnosis not present

## 2017-05-08 DIAGNOSIS — R51 Headache: Secondary | ICD-10-CM | POA: Diagnosis not present

## 2017-05-08 DIAGNOSIS — R0602 Shortness of breath: Secondary | ICD-10-CM | POA: Diagnosis not present

## 2017-05-08 DIAGNOSIS — I10 Essential (primary) hypertension: Secondary | ICD-10-CM | POA: Diagnosis not present

## 2017-05-08 DIAGNOSIS — G4719 Other hypersomnia: Secondary | ICD-10-CM | POA: Diagnosis not present

## 2017-05-08 DIAGNOSIS — E782 Mixed hyperlipidemia: Secondary | ICD-10-CM | POA: Diagnosis not present

## 2017-05-08 DIAGNOSIS — I208 Other forms of angina pectoris: Secondary | ICD-10-CM | POA: Diagnosis not present

## 2017-05-08 DIAGNOSIS — G919 Hydrocephalus, unspecified: Secondary | ICD-10-CM | POA: Diagnosis not present

## 2017-05-08 DIAGNOSIS — R569 Unspecified convulsions: Secondary | ICD-10-CM | POA: Diagnosis not present

## 2017-05-08 DIAGNOSIS — G4733 Obstructive sleep apnea (adult) (pediatric): Secondary | ICD-10-CM | POA: Diagnosis not present

## 2017-05-08 DIAGNOSIS — E669 Obesity, unspecified: Secondary | ICD-10-CM | POA: Diagnosis not present

## 2017-05-08 DIAGNOSIS — K219 Gastro-esophageal reflux disease without esophagitis: Secondary | ICD-10-CM | POA: Diagnosis not present

## 2017-05-20 ENCOUNTER — Other Ambulatory Visit: Payer: Self-pay | Admitting: Allergy and Immunology

## 2017-05-22 DIAGNOSIS — R0602 Shortness of breath: Secondary | ICD-10-CM | POA: Diagnosis not present

## 2017-05-22 DIAGNOSIS — G4733 Obstructive sleep apnea (adult) (pediatric): Secondary | ICD-10-CM | POA: Diagnosis not present

## 2017-05-23 DIAGNOSIS — R0602 Shortness of breath: Secondary | ICD-10-CM | POA: Diagnosis not present

## 2017-05-23 DIAGNOSIS — G4733 Obstructive sleep apnea (adult) (pediatric): Secondary | ICD-10-CM | POA: Diagnosis not present

## 2017-05-29 ENCOUNTER — Other Ambulatory Visit: Payer: Self-pay | Admitting: Specialist

## 2017-05-29 DIAGNOSIS — G4733 Obstructive sleep apnea (adult) (pediatric): Secondary | ICD-10-CM | POA: Diagnosis not present

## 2017-05-29 DIAGNOSIS — R0602 Shortness of breath: Secondary | ICD-10-CM | POA: Diagnosis not present

## 2017-06-05 ENCOUNTER — Ambulatory Visit
Admission: RE | Admit: 2017-06-05 | Discharge: 2017-06-05 | Disposition: A | Discharge status: Home / Self Care | Payer: PPO | Source: Ambulatory Visit | Attending: Specialist | Admitting: Specialist

## 2017-06-05 DIAGNOSIS — Z131 Encounter for screening for diabetes mellitus: Secondary | ICD-10-CM | POA: Diagnosis not present

## 2017-06-05 DIAGNOSIS — R918 Other nonspecific abnormal finding of lung field: Secondary | ICD-10-CM | POA: Diagnosis not present

## 2017-06-05 DIAGNOSIS — R0602 Shortness of breath: Secondary | ICD-10-CM | POA: Insufficient documentation

## 2017-06-05 DIAGNOSIS — I10 Essential (primary) hypertension: Secondary | ICD-10-CM | POA: Diagnosis not present

## 2017-06-05 DIAGNOSIS — R7303 Prediabetes: Secondary | ICD-10-CM | POA: Diagnosis not present

## 2017-06-05 DIAGNOSIS — F3340 Major depressive disorder, recurrent, in remission, unspecified: Secondary | ICD-10-CM | POA: Diagnosis not present

## 2017-06-05 DIAGNOSIS — E782 Mixed hyperlipidemia: Secondary | ICD-10-CM | POA: Diagnosis not present

## 2017-06-08 DIAGNOSIS — G4733 Obstructive sleep apnea (adult) (pediatric): Secondary | ICD-10-CM | POA: Diagnosis not present

## 2017-06-09 DIAGNOSIS — S93402A Sprain of unspecified ligament of left ankle, initial encounter: Secondary | ICD-10-CM | POA: Diagnosis not present

## 2017-06-09 DIAGNOSIS — S99912A Unspecified injury of left ankle, initial encounter: Secondary | ICD-10-CM | POA: Diagnosis not present

## 2017-06-09 DIAGNOSIS — E785 Hyperlipidemia, unspecified: Secondary | ICD-10-CM | POA: Diagnosis not present

## 2017-06-09 DIAGNOSIS — I10 Essential (primary) hypertension: Secondary | ICD-10-CM | POA: Diagnosis not present

## 2017-06-09 DIAGNOSIS — Z885 Allergy status to narcotic agent status: Secondary | ICD-10-CM | POA: Diagnosis not present

## 2017-06-09 DIAGNOSIS — Z79899 Other long term (current) drug therapy: Secondary | ICD-10-CM | POA: Diagnosis not present

## 2017-06-09 DIAGNOSIS — M25562 Pain in left knee: Secondary | ICD-10-CM | POA: Diagnosis not present

## 2017-06-09 DIAGNOSIS — F419 Anxiety disorder, unspecified: Secondary | ICD-10-CM | POA: Diagnosis not present

## 2017-06-09 DIAGNOSIS — F329 Major depressive disorder, single episode, unspecified: Secondary | ICD-10-CM | POA: Diagnosis not present

## 2017-06-09 DIAGNOSIS — S8992XA Unspecified injury of left lower leg, initial encounter: Secondary | ICD-10-CM | POA: Diagnosis not present

## 2017-06-09 DIAGNOSIS — M25572 Pain in left ankle and joints of left foot: Secondary | ICD-10-CM | POA: Diagnosis not present

## 2017-06-09 DIAGNOSIS — M79672 Pain in left foot: Secondary | ICD-10-CM | POA: Diagnosis not present

## 2017-06-09 DIAGNOSIS — Z882 Allergy status to sulfonamides status: Secondary | ICD-10-CM | POA: Diagnosis not present

## 2017-06-09 DIAGNOSIS — S99922A Unspecified injury of left foot, initial encounter: Secondary | ICD-10-CM | POA: Diagnosis not present

## 2017-06-09 DIAGNOSIS — Z880 Allergy status to penicillin: Secondary | ICD-10-CM | POA: Diagnosis not present

## 2017-06-18 ENCOUNTER — Other Ambulatory Visit
Admission: RE | Admit: 2017-06-18 | Discharge: 2017-06-18 | Disposition: A | Discharge status: Home / Self Care | Payer: PPO | Source: Ambulatory Visit | Attending: Specialist | Admitting: Specialist

## 2017-06-18 DIAGNOSIS — R05 Cough: Secondary | ICD-10-CM | POA: Insufficient documentation

## 2017-06-18 DIAGNOSIS — M791 Myalgia, unspecified site: Secondary | ICD-10-CM | POA: Diagnosis not present

## 2017-06-18 DIAGNOSIS — R0781 Pleurodynia: Secondary | ICD-10-CM | POA: Insufficient documentation

## 2017-06-18 DIAGNOSIS — R0609 Other forms of dyspnea: Secondary | ICD-10-CM | POA: Diagnosis not present

## 2017-06-18 LAB — FIBRIN DERIVATIVES D-DIMER (ARMC ONLY): FIBRIN DERIVATIVES D-DIMER (ARMC): 353.01 ng{FEU}/mL (ref 0.00–499.00)

## 2017-06-19 DIAGNOSIS — L820 Inflamed seborrheic keratosis: Secondary | ICD-10-CM | POA: Diagnosis not present

## 2017-06-19 DIAGNOSIS — L570 Actinic keratosis: Secondary | ICD-10-CM | POA: Diagnosis not present

## 2017-06-19 DIAGNOSIS — L578 Other skin changes due to chronic exposure to nonionizing radiation: Secondary | ICD-10-CM | POA: Diagnosis not present

## 2017-06-19 DIAGNOSIS — L728 Other follicular cysts of the skin and subcutaneous tissue: Secondary | ICD-10-CM | POA: Diagnosis not present

## 2017-06-19 DIAGNOSIS — L821 Other seborrheic keratosis: Secondary | ICD-10-CM | POA: Diagnosis not present

## 2017-06-27 DIAGNOSIS — G43009 Migraine without aura, not intractable, without status migrainosus: Secondary | ICD-10-CM | POA: Diagnosis not present

## 2017-06-28 ENCOUNTER — Ambulatory Visit (INDEPENDENT_AMBULATORY_CARE_PROVIDER_SITE_OTHER): Payer: PPO | Admitting: General Surgery

## 2017-06-28 ENCOUNTER — Ambulatory Visit
Admission: RE | Admit: 2017-06-28 | Discharge: 2017-06-28 | Disposition: A | Discharge status: Home / Self Care | Payer: PPO | Source: Ambulatory Visit | Attending: General Surgery | Admitting: General Surgery

## 2017-06-28 ENCOUNTER — Telehealth: Payer: Self-pay | Admitting: *Deleted

## 2017-06-28 ENCOUNTER — Encounter: Payer: Self-pay | Admitting: General Surgery

## 2017-06-28 VITALS — BP 130/70 | HR 78 | Temp 97.9°F | Resp 14 | Ht 63.0 in | Wt 220.0 lb

## 2017-06-28 DIAGNOSIS — R1031 Right lower quadrant pain: Secondary | ICD-10-CM

## 2017-06-28 DIAGNOSIS — N200 Calculus of kidney: Secondary | ICD-10-CM | POA: Diagnosis not present

## 2017-06-28 DIAGNOSIS — I700 Atherosclerosis of aorta: Secondary | ICD-10-CM | POA: Diagnosis not present

## 2017-06-28 DIAGNOSIS — R1110 Vomiting, unspecified: Secondary | ICD-10-CM | POA: Diagnosis not present

## 2017-06-28 DIAGNOSIS — Z982 Presence of cerebrospinal fluid drainage device: Secondary | ICD-10-CM

## 2017-06-28 DIAGNOSIS — R197 Diarrhea, unspecified: Secondary | ICD-10-CM | POA: Diagnosis not present

## 2017-06-28 MED ORDER — IOPAMIDOL (ISOVUE-300) INJECTION 61%
100.0000 mL | Freq: Once | INTRAVENOUS | Status: AC | PRN
  Administered 2017-06-28: 100 mL via INTRAVENOUS

## 2017-06-28 NOTE — Progress Notes (Signed)
97.9 Patient ID: [name], female   DOB: 28-Mar-1971, 46 y.o.   MRN: [id number]  Chief Complaint  Patient presents with  . Follow-up    HPI [name] is a 46 y.o. female .she started feeling "sick" with diarrhea that seemed to start over a week ago. Associated with right side "sharp needle" pains.  Denies fever or chills. Noticed some blood the second of three times that she vomited. Dark, silver dollar size spot. No blood with the third episode that was green in color.  This morning she awoke early because the pain has gotten worse.    Prior to last week she was doing well since her surgery, laparoscopic cholecystectomy and lysis of adhesions around her VP shunt on 04-09-17.  Accompanied by her husband.   HPI  Past Medical History:  Diagnosis Date  . Anxiety   . Arthritis   . Asthma   . Depression   . GERD (gastroesophageal reflux disease)   . H. pylori infection 2015  . Herpes genitalia   . Hyperlipidemia   . Lupus    Dr. Lucky Cowboy informed pt she does not have lupus  . Seizures ([place])    last seizures 4-5 years ago.  . 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 ABDOMINAL HYSTERECTOMY  2008  . BRAIN SURGERY  02/2016   removal of 2 blood clotts from the brain.  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 BREAST EXCISIONAL BIOPSY Left 01/2014   lymph node  . BREAST SURGERY Left 02/04/14   left core bx identifying a fibroadenoma and excision of left axillary lymph node, benign  . BURR HOLE FOR SUBDURAL HEMATOMA  March 18, 2016  . CHOLECYSTECTOMY N/A 04/09/2017   Procedure: LAPAROSCOPIC CHOLECYSTECTOMY WITH INTRAOPERATIVE CHOLANGIOGRAM;  Surgeon: Robert Bellow, MD;  Location: [facility] ORS;  Service: General;  Laterality: N/A;  . COLONOSCOPY  12/21/2015  . COLONOSCOPY W/ BIOPSIES  12/08/2016   Tubular adenoma the sigmoid. No dysplasia. Benign colonic biopsies without evidence of colitis. [name],  [name] [facility]  . FRACTURE SURGERY Right 2005   hand  . LAPAROSCOPIC REVISION VENTRICULAR-PERITONEAL (V-P) SHUNT  2000  . LEG SURGERY Left 2002  . NECK SURGERY  1985  . POSTERIOR LAMINECTOMY THORACIC AND LUMBAR SPINE Bilateral 1985   Scoliosis stabilization  . SHUNT REVISION  2003 & July 2017  . SPINE SURGERY  1985   Scoliosis  . TUBAL LIGATION  1995  . UPPER GI ENDOSCOPY  12/08/2016   Hypertrophic gastric polyp, mild chronic gastritis. No evidence of H. pylori. [name], [name], [facility]    Family History  Problem Relation Age of Onset  . Leukemia Mother 34  . Colon polyps Mother   . Lung cancer Father   . Leukemia Maternal Aunt   . Brain cancer Sister     Social History Social History  Substance Use Topics  . Smoking status: Never Smoker  . Smokeless tobacco: Never Used  . Alcohol use No    Allergies  Allergen Reactions  . Levofloxacin Anaphylaxis  . Penicillins Shortness Of Breath and Rash    Has patient had a PCN reaction causing immediate rash, facial/tongue/throat swelling, SOB or lightheadedness with hypotension: Yes Has patient had a PCN reaction causing severe rash involving mucus membranes or skin necrosis: Unknown Has patient had a PCN reaction that required hospitalization: No Has patient had a PCN  reaction occurring within the last 10 years: No If all of the above answers are "NO", then may proceed with Cephalosporin use.   . Sumatriptan Rash  . Clindamycin Diarrhea  . Codeine Swelling  . Diclofenac Potassium     Other reaction(s): GI Upset (intolerance)  . Sulfa Antibiotics Swelling    Current Outpatient Prescriptions  Medication Sig Dispense Refill  . albuterol (PROAIR HFA) 108 (90 Base) MCG/ACT inhaler Inhale two puffs every four to six hours as needed for cough or wheeze. 1 Inhaler 1  . atorvastatin (LIPITOR) 40 MG tablet Take 40 mg by mouth daily. In am.    . buPROPion (WELLBUTRIN SR) 100 MG 12 hr tablet Take  100 mg by mouth daily. In am    . cetirizine (ZYRTEC) 10 MG tablet Take 10 mg by mouth daily. In am    . citalopram (CELEXA) 20 MG tablet Take 20 mg by mouth daily. In am.    . Fluticasone Furoate (ARNUITY ELLIPTA) 200 MCG/ACT AEPB Inhale 1 Dose into the lungs daily. (Patient taking differently: Inhale 1 puff into the lungs daily. ) 30 each 5  . losartan-hydrochlorothiazide (HYZAAR) 50-12.5 MG tablet Take one tablet once daily 90 tablet 0  . montelukast (SINGULAIR) 10 MG tablet TAKE ONE TABLET BY MOUTH AT BEDTIME 30 tablet 0  . pantoprazole (PROTONIX) 40 MG tablet TAKE ONE TABLET BY MOUTH ONCE DAILY 30 tablet 0  . potassium chloride 20 MEQ TBCR Take 20 mEq by mouth daily. 30 tablet 0  . ranitidine (ZANTAC) 300 MG tablet Take 1 tablet (300 mg total) by mouth at bedtime. 30 tablet 5  . rizatriptan (MAXALT) 10 MG tablet Take 1 tablet by mouth daily as needed for migraine.     . topiramate (TOPAMAX) 100 MG tablet Take 100 mg by mouth daily.     . valACYclovir (VALTREX) 500 MG tablet Take 500 mg by mouth daily.     [name] levETIRAcetam (KEPPRA) 500 MG tablet Take 500 mg by mouth.     No current facility-administered medications for this visit.     Review of Systems Review of Systems  Constitutional: Negative.   Respiratory: Negative.   Cardiovascular: Negative.     Blood pressure 130/70, pulse 78, temperature 97.9 F (36.6 C), temperature source Oral, resp. rate 14, height 5\' 3"  (1.6 m), weight 220 lb (99.8 kg).  Physical Exam Physical Exam  Constitutional: She is oriented to person, place, and time. She appears well-developed and well-nourished.  Cardiovascular: Normal rate, regular rhythm and normal heart sounds.   Pulmonary/Chest: Effort normal and breath sounds normal.  Abdominal: Soft. Normal appearance and bowel sounds are normal. There is no hepatomegaly. There is tenderness in the right lower quadrant and periumbilical area. No hernia.    Lymphadenopathy:    She has no cervical  adenopathy.    She has no axillary adenopathy.  Neurological: She is alert and oriented to person, place, and time.  Skin: Skin is warm and dry.    Data Reviewed CT scan of the abdomen and pelvis completed prior to her return to the office: IMPRESSION: No acute findings in the abdomen/pelvis.  Ventriculoperitoneal shunt with tip over the left lower quadrant intact.  Nonobstructing punctate lower pole right renal stone unchanged.  Aortic Atherosclerosis (ICD10-I70.0).  Assessment    Abdominal pain without evidence of acute intra-abdominal inflammation.  History hematemesis, likely secondary to vomiting.    Plan    Original plans were for the patient to obtain some laboratory work as  well, but she was sent directly to CT from admitting and laboratory was never done. Based on my review the scan and the radiologist report, I think it's unlikely that the laboratory tests which change plans at this time. There is no indication for about 8 use, which is good because the patient is allergic to almost any at all antibiotics that would be appropriate for an intra-abdominal infection.  I think she'll respond to conservative measures including clear liquid diet, mild analgesics including Tylenol or Advil and continued observation.  She's been encouraged to call over the weekend if her symptoms worsen, otherwise will follow up by phone next week. Patient to have a ct scan and labs.      HPI, Physical Exam, Assessment and Plan have been scribed under the direction and in the presence of Hervey Ard, MD.  Gaspar Cola, CMA  I have completed the exam and reviewed the above documentation for accuracy and completeness.  I agree with the above.  Haematologist has been used and any errors in dictation or transcription are unintentional.  Hervey Ard, [name], F.A.C.S.  Robert Bellow 06/29/2017, 3:14 PM  Patient has been scheduled for a CT abdomen/pelvis with contrast at  [facility] for today (STAT). Prep: nothing else to eat or drink. She will wait at [facility] until we have a call report.  Patient verbalizes understanding.  Dominga Ferry, CMA

## 2017-06-28 NOTE — Telephone Encounter (Signed)
She called to let Dr Bary Castilla know that she started feeling "sick" with diarrhea that seemed to start over the weekend. Associated with right side "sharp needle" pains. Denies fever or chills. She states she vomited last night green in color. Prior to this she was doing well since her surgery, laparoscopic cholecystectomy on 04-09-17.

## 2017-06-28 NOTE — Patient Instructions (Signed)
Patient to have a ct scan and labs.

## 2017-06-29 DIAGNOSIS — Z982 Presence of cerebrospinal fluid drainage device: Secondary | ICD-10-CM | POA: Insufficient documentation

## 2017-06-29 DIAGNOSIS — R1031 Right lower quadrant pain: Secondary | ICD-10-CM

## 2017-06-29 HISTORY — DX: Right lower quadrant pain: R10.31

## 2017-06-29 HISTORY — DX: Presence of cerebrospinal fluid drainage device: Z98.2

## 2017-07-03 ENCOUNTER — Telehealth: Payer: Self-pay

## 2017-07-03 NOTE — Telephone Encounter (Signed)
-----   Message from Robert Bellow, MD sent at 06/29/2017  3:15 PM EDT ----- Please give the patient a call Monday to see how she is doing in regards to her abdominal pain. Thank you

## 2017-07-03 NOTE — Telephone Encounter (Signed)
Spoke with the patient and she says that she has not had much improvement. She reports no nausea, vomiting, chills, or fever. Her pain has not changed and is much the same as when she was seen in the office.

## 2017-07-05 ENCOUNTER — Other Ambulatory Visit: Payer: Self-pay | Admitting: Obstetrics and Gynecology

## 2017-07-05 ENCOUNTER — Ambulatory Visit (INDEPENDENT_AMBULATORY_CARE_PROVIDER_SITE_OTHER): Payer: PPO | Admitting: Obstetrics and Gynecology

## 2017-07-05 ENCOUNTER — Ambulatory Visit (INDEPENDENT_AMBULATORY_CARE_PROVIDER_SITE_OTHER): Payer: PPO

## 2017-07-05 ENCOUNTER — Encounter: Payer: Self-pay | Admitting: Obstetrics and Gynecology

## 2017-07-05 VITALS — BP 124/80 | HR 94 | Ht 63.0 in | Wt 222.0 lb

## 2017-07-05 DIAGNOSIS — R82998 Other abnormal findings in urine: Secondary | ICD-10-CM | POA: Diagnosis not present

## 2017-07-05 DIAGNOSIS — M546 Pain in thoracic spine: Secondary | ICD-10-CM

## 2017-07-05 DIAGNOSIS — N3000 Acute cystitis without hematuria: Secondary | ICD-10-CM

## 2017-07-05 DIAGNOSIS — R102 Pelvic and perineal pain unspecified side: Secondary | ICD-10-CM

## 2017-07-05 LAB — POCT URINALYSIS DIPSTICK
BILIRUBIN UA: NEGATIVE
Blood, UA: NEGATIVE
GLUCOSE UA: NEGATIVE
Ketones, UA: NEGATIVE
Nitrite, UA: NEGATIVE
Spec Grav, UA: 1.01 (ref 1.010–1.025)
pH, UA: 7.5 (ref 5.0–8.0)

## 2017-07-05 NOTE — Progress Notes (Signed)
Chief Complaint  Patient presents with  . Pelvic Pain    HPI:      [name] is a 46 y.o. G2P2 who LMP was No LMP recorded. Patient has had a hysterectomy., presents today for severe pelvic pain for the past 3 wks. Pain is sharp, constant in RLQ and umbilical area.Sx worse now than when sx first started.  She has tried aleve without any sx relief, and is having trouble sleeping due to pain. Pt has also had diarrhea, abd bloating/distention, itchy skin, chills (no fevers), urinary hesitancy and an intermittent vag/urinary odor of "rotten flesh". No vag d/c, irritation, LBP, n/v.  She is s/p laparoscopic cholecystectomy and lysis of adhesions around her VP shunt on 04-09-17 with Dr. Bary Castilla, and saw him last wk for sx. He ordered abd and pelvic CT scan which were normal.   She is s/p TVH due to menorrhagia. No VB, spotting.   Past Medical History:  Diagnosis Date  . Anxiety   . Arthritis   . Asthma   . Depression   . GERD (gastroesophageal reflux disease)   . H. pylori infection 2015  . Herpes genitalia   . Hyperlipidemia   . Lupus    Dr. Lucky Cowboy informed pt she does not have lupus  . Seizures ([place])    last seizures 4-5 years ago.  . 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 ABDOMINAL HYSTERECTOMY  2008  . BRAIN SURGERY  02/2016   removal of 2 blood clotts from the brain.  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 BREAST EXCISIONAL BIOPSY Left 01/2014   lymph node  . BREAST SURGERY Left 02/04/14   left core bx identifying a fibroadenoma and excision of left axillary lymph node, benign  . BURR HOLE FOR SUBDURAL HEMATOMA  March 18, 2016  . CHOLECYSTECTOMY N/A 04/09/2017   Procedure: LAPAROSCOPIC CHOLECYSTECTOMY WITH INTRAOPERATIVE CHOLANGIOGRAM;  Surgeon: Robert Bellow, MD;  Location: [facility] ORS;  Service: General;  Laterality: N/A;  . COLONOSCOPY  12/21/2015  . COLONOSCOPY W/ BIOPSIES  12/08/2016   Tubular adenoma the sigmoid. No dysplasia. Benign colonic biopsies without evidence of colitis. [name], [name] [facility]  . FRACTURE SURGERY Right 2005   hand  . LAPAROSCOPIC REVISION VENTRICULAR-PERITONEAL (V-P) SHUNT  2000  . LEG SURGERY Left 2002  . NECK SURGERY  1985  . POSTERIOR LAMINECTOMY THORACIC AND LUMBAR SPINE Bilateral 1985   Scoliosis stabilization  . SHUNT REVISION  2003 & July 2017  . SPINE SURGERY  1985   Scoliosis  . TUBAL LIGATION  1995  . UPPER GI ENDOSCOPY  12/08/2016   Hypertrophic gastric polyp, mild chronic gastritis. No evidence of H. pylori. [name], [name], [facility]    Family History  Problem Relation Age of Onset  . Leukemia Mother 77  . Colon polyps Mother   . Lung cancer Father   . Leukemia Maternal Aunt   . Brain cancer Sister     Social History   Social History  . Marital status: Married    Spouse name: N/A  . Number of children: N/A  . Years of education: N/A   Occupational History  . Not on file.   Social History Main Topics  . Smoking status: Never Smoker  . Smokeless tobacco: Never Used  . Alcohol use No  . Drug use: No  . Sexual activity: Not Currently  Other Topics Concern  . Not on file   Social History Narrative  . No narrative on file     Current Outpatient Prescriptions:  .  albuterol (PROAIR HFA) 108 (90 Base) MCG/ACT inhaler, Inhale two puffs every four to six hours as needed for cough or wheeze., Disp: 1 Inhaler, Rfl: 1 .  atorvastatin (LIPITOR) 40 MG tablet, Take 40 mg by mouth daily. In am., Disp: , Rfl:  .  buPROPion (WELLBUTRIN SR) 100 MG 12 hr tablet, Take 100 mg by mouth daily. In am, Disp: , Rfl:  .  cetirizine (ZYRTEC) 10 MG tablet, Take 10 mg by mouth daily. In am, Disp: , Rfl:  .  citalopram (CELEXA) 20 MG tablet, Take 20 mg by mouth daily. In am., Disp: , Rfl:  .  Fluticasone Furoate (ARNUITY ELLIPTA) 200 MCG/ACT AEPB, Inhale 1 Dose into the lungs daily. (Patient  taking differently: Inhale 1 puff into the lungs daily. ), Disp: 30 each, Rfl: 5 .  levETIRAcetam (KEPPRA) 500 MG tablet, Take 500 mg by mouth., Disp: , Rfl:  .  losartan-hydrochlorothiazide (HYZAAR) 50-12.5 MG tablet, Take one tablet once daily, Disp: 90 tablet, Rfl: 0 .  montelukast (SINGULAIR) 10 MG tablet, TAKE ONE TABLET BY MOUTH AT BEDTIME, Disp: 30 tablet, Rfl: 0 .  pantoprazole (PROTONIX) 40 MG tablet, TAKE ONE TABLET BY MOUTH ONCE DAILY, Disp: 30 tablet, Rfl: 0 .  potassium chloride 20 MEQ TBCR, Take 20 mEq by mouth daily., Disp: 30 tablet, Rfl: 0 .  ranitidine (ZANTAC) 300 MG tablet, Take 1 tablet (300 mg total) by mouth at bedtime., Disp: 30 tablet, Rfl: 5 .  rizatriptan (MAXALT) 10 MG tablet, Take 1 tablet by mouth daily as needed for migraine. , Disp: , Rfl:  .  topiramate (TOPAMAX) 100 MG tablet, Take 100 mg by mouth daily. , Disp: , Rfl:  .  valACYclovir (VALTREX) 500 MG tablet, Take 500 mg by mouth daily. , Disp: , Rfl:    ROS:  Review of Systems  Constitutional: Positive for chills and fatigue. Negative for fever.  Gastrointestinal: Positive for abdominal distention and diarrhea. Negative for blood in stool, constipation, nausea and vomiting.  Genitourinary: Positive for frequency, pelvic pain and urgency. Negative for dyspareunia, dysuria, flank pain, hematuria, vaginal bleeding, vaginal discharge and vaginal pain.  Musculoskeletal: Positive for arthralgias. Negative for back pain.  Skin: Negative for rash.     OBJECTIVE:   Vitals:  BP 124/80   Pulse 94   Ht 5\' 3"  (1.6 m)   Wt 222 lb (100.7 kg)   BMI 39.33 kg/m   Physical Exam  Constitutional: She is oriented to person, place, and time and well-developed, well-nourished, and in no distress. Vital signs are normal.  Abdominal: Normal appearance. She exhibits distension. She exhibits no ascites and no mass. There is generalized tenderness. There is CVA tenderness.  Genitourinary: Vagina normal, uterus normal,  cervix normal, right adnexa normal, left adnexa normal and vulva normal. Uterus is not enlarged. Cervix exhibits no motion tenderness and no tenderness. Right adnexum displays no mass and no tenderness. Left adnexum displays no mass and no tenderness. Vulva exhibits no erythema, no exudate, no lesion, no rash and no tenderness. Vagina exhibits no lesion.  Musculoskeletal:       Thoracic back: She exhibits tenderness and pain. She exhibits no bony tenderness and no swelling.       Lumbar back: She exhibits tenderness and pain. She exhibits no swelling and no deformity.  THORACIC AND LUMBAR PARASPINAL  MUSCLES TENDER TO PALPATE  Neurological: She is oriented to person, place, and time.  Vitals reviewed.   Results: Results for orders placed or performed in visit on 07/05/17 (from the past 24 hour(s))  POCT Urinalysis Dipstick     Status: Abnormal   Collection Time: 07/05/17  5:18 PM  Result Value Ref Range   Color, UA YELLOW    Clarity, UA     Glucose, UA neg    Bilirubin, UA neg    Ketones, UA neg    Spec Grav, UA 1.010 1.010 - 1.025   Blood, UA neg    pH, UA 7.5 5.0 - 8.0   Protein, UA trace    Urobilinogen, UA  0.2 or 1.0 E.U./dL   Nitrite, UA neg    Leukocytes, UA Moderate (2+) (A) Negative      Assessment/Plan: Pelvic pain - Neg GYN u/s. Neg CT scan with Dr. Bary Castilla. No GYN etiology. Question cause of sx. Pt very uncomfortable. Refer back to Dr. Bary Castilla. Pt to call tomorrow for appt - Plan: POCT Urinalysis Dipstick  Urine leukocytes increased - Check C&S. Will await results before trying to treat with abx since has so many abx allergies/adverse sx. If UTI, doubt etiology of pain.   Acute cystitis without hematuria - Question UTI? - Plan: Urine Culture  Acute bilateral thoracic back pain - On exam but pt denies back pain sx otherwise.     Return if symptoms worsen or fail to improve.  Kennidy Lamke B. Traevon Meiring, PA-C 07/05/2017 5:20 PM

## 2017-07-06 DIAGNOSIS — N3000 Acute cystitis without hematuria: Secondary | ICD-10-CM | POA: Diagnosis not present

## 2017-07-08 LAB — URINE CULTURE

## 2017-07-10 ENCOUNTER — Ambulatory Visit (INDEPENDENT_AMBULATORY_CARE_PROVIDER_SITE_OTHER): Payer: PPO | Admitting: General Surgery

## 2017-07-10 ENCOUNTER — Encounter: Payer: Self-pay | Admitting: General Surgery

## 2017-07-10 VITALS — BP 123/74 | HR 75 | Temp 98.6°F | Resp 14 | Ht 63.0 in | Wt 223.0 lb

## 2017-07-10 DIAGNOSIS — R1011 Right upper quadrant pain: Secondary | ICD-10-CM | POA: Diagnosis not present

## 2017-07-10 DIAGNOSIS — R112 Nausea with vomiting, unspecified: Secondary | ICD-10-CM

## 2017-07-10 DIAGNOSIS — R1031 Right lower quadrant pain: Secondary | ICD-10-CM

## 2017-07-10 MED ORDER — ONDANSETRON HCL 4 MG PO TABS: 4.0000 mg | ORAL_TABLET | Freq: Three times a day (TID) | ORAL | 0 refills | Status: DC | PRN

## 2017-07-10 NOTE — Patient Instructions (Signed)
Follow up appointment to be announced.  

## 2017-07-10 NOTE — Progress Notes (Signed)
Patient ID: [name], female   DOB: [birth year], 46 y.o.   MRN: [id number]  Chief Complaint  Patient presents with  . Abdominal Pain    HPI [name] is a 46 y.o. female here today for her follow up abdominal pain.Her pain has not changed and is much the same as when she was seen in the office on 06/28/2017.She is having sharpe pain at her belly button, the pain wakes her up at night time. She started having joint pain that started last week, very tender if she gets touched. In the last three days she has had very bad head aches. The button of her feet are painful.  She reports no nausea,chills,or fever. This morning she states she threw up. Patient was [facility] at [facility] on 07/05/2017.                                                                         Marland KitchenHPI  Past Medical History:  Diagnosis Date  . Anxiety   . Arthritis   . Asthma   . Depression   . GERD (gastroesophageal reflux disease)   . H. pylori infection 2015  . Herpes genitalia   . Hyperlipidemia   . Lupus    Dr. Lucky Cowboy informed pt she does not have lupus  . Seizures ([place])    last seizures 4-5 years ago.  . 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 ABDOMINAL HYSTERECTOMY  2008  . BRAIN SURGERY  02/2016   removal of 2 blood clotts from the brain.  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 BREAST EXCISIONAL BIOPSY Left 01/2014   lymph node  . BREAST SURGERY Left 02/04/14   left core bx identifying a fibroadenoma and excision of left axillary lymph node, benign  . BURR HOLE FOR SUBDURAL HEMATOMA  March 18, 2016  . CHOLECYSTECTOMY N/A 04/09/2017   Procedure: LAPAROSCOPIC CHOLECYSTECTOMY WITH INTRAOPERATIVE CHOLANGIOGRAM;  Surgeon: Robert Bellow, MD;  Location: [facility] ORS;  Service: General;  Laterality: N/A;  . COLONOSCOPY  12/21/2015  . COLONOSCOPY W/ BIOPSIES  12/08/2016   Tubular adenoma the sigmoid. No dysplasia. Benign colonic  biopsies without evidence of colitis. [name], [name] [facility]  . FRACTURE SURGERY Right 2005   hand  . LAPAROSCOPIC REVISION VENTRICULAR-PERITONEAL (V-P) SHUNT  2000  . LEG SURGERY Left 2002  . NECK SURGERY  1985  . POSTERIOR LAMINECTOMY THORACIC AND LUMBAR SPINE Bilateral 1985   Scoliosis stabilization  . SHUNT REVISION  2003 & July 2017  . SPINE SURGERY  1985   Scoliosis  . TUBAL LIGATION  1995  . UPPER GI ENDOSCOPY  12/08/2016   Hypertrophic gastric polyp, mild chronic gastritis. No evidence of H. pylori. [name], [name], [facility]    Family History  Problem Relation Age of Onset  . Leukemia Mother 33  . Colon polyps Mother   . Lung cancer Father   . Leukemia Maternal Aunt   . Brain cancer Sister     Social History Social History  Substance Use Topics  . Smoking status: Never Smoker  . Smokeless tobacco: Never Used  . Alcohol use  No    Allergies  Allergen Reactions  . Levofloxacin Anaphylaxis  . Penicillins Shortness Of Breath and Rash    Has patient had a PCN reaction causing immediate rash, facial/tongue/throat swelling, SOB or lightheadedness with hypotension: Yes Has patient had a PCN reaction causing severe rash involving mucus membranes or skin necrosis: Unknown Has patient had a PCN reaction that required hospitalization: No Has patient had a PCN reaction occurring within the last 10 years: No If all of the above answers are "NO", then may proceed with Cephalosporin use.   . Sumatriptan Rash  . Clindamycin Diarrhea  . Codeine Swelling  . Diclofenac Potassium     Other reaction(s): GI Upset (intolerance)  . Sulfa Antibiotics Swelling    Current Outpatient Prescriptions  Medication Sig Dispense Refill  . albuterol (PROAIR HFA) 108 (90 Base) MCG/ACT inhaler Inhale two puffs every four to six hours as needed for cough or wheeze. 1 Inhaler 1  . atorvastatin (LIPITOR) 40 MG tablet Take 40 mg by mouth daily. In am.     . buPROPion (WELLBUTRIN SR) 100 MG 12 hr tablet Take 100 mg by mouth daily. In am    . cetirizine (ZYRTEC) 10 MG tablet Take 10 mg by mouth daily. In am    . citalopram (CELEXA) 20 MG tablet Take 20 mg by mouth daily. In am.    . Fluticasone Furoate (ARNUITY ELLIPTA) 200 MCG/ACT AEPB Inhale 1 Dose into the lungs daily. (Patient taking differently: Inhale 1 puff into the lungs daily. ) 30 each 5  . losartan-hydrochlorothiazide (HYZAAR) 50-12.5 MG tablet Take one tablet once daily 90 tablet 0  . montelukast (SINGULAIR) 10 MG tablet TAKE ONE TABLET BY MOUTH AT BEDTIME 30 tablet 0  . pantoprazole (PROTONIX) 40 MG tablet TAKE ONE TABLET BY MOUTH ONCE DAILY 30 tablet 0  . potassium chloride 20 MEQ TBCR Take 20 mEq by mouth daily. 30 tablet 0  . ranitidine (ZANTAC) 300 MG tablet Take 1 tablet (300 mg total) by mouth at bedtime. 30 tablet 5  . rizatriptan (MAXALT) 10 MG tablet Take 1 tablet by mouth daily as needed for migraine.     . topiramate (TOPAMAX) 100 MG tablet Take 100 mg by mouth daily.     . valACYclovir (VALTREX) 500 MG tablet Take 500 mg by mouth daily.     [name] levETIRAcetam (KEPPRA) 500 MG tablet Take 500 mg by mouth.    . ondansetron (ZOFRAN) 4 MG tablet Take 1 tablet (4 mg total) by mouth every 8 (eight) hours as needed for nausea or vomiting. 30 tablet 0   No current facility-administered medications for this visit.     Review of Systems Review of Systems  Constitutional: Negative.   Respiratory: Negative.   Cardiovascular: Negative.   Gastrointestinal: Positive for abdominal pain, nausea and vomiting.  Musculoskeletal:       Diffuse aching in shoulder, upper arms, and thighs. Reports tenderness to light touch.   Neurological: Positive for headaches (right parietal area, no visual symptoms. ).       Denies diplopia.    Blood pressure 123/74, pulse 75, temperature 98.6 F (37 C), temperature source Oral, resp. rate 14, height 5\' 3"  (1.6 m), weight 223 lb (101.2 kg). Weight up  3 pounds since October 11 exam.  Physical Exam Physical Exam  Constitutional: She is oriented to person, place, and time. She appears well-developed and well-nourished.  Eyes: Conjunctivae are normal. No scleral icterus.  Neck: Neck supple.  Cardiovascular: Normal rate, regular  rhythm and normal heart sounds.   Pulses:      Dorsalis pedis pulses are 2+ on the right side, and 2+ on the left side.       Posterior tibial pulses are 2+ on the right side, and 2+ on the left side.  Pulmonary/Chest: Effort normal and breath sounds normal.  Abdominal: Soft. Normal appearance and bowel sounds are normal.    Musculoskeletal:       Legs: Lymphadenopathy:    She has no cervical adenopathy.    She has no axillary adenopathy.       Right: No inguinal adenopathy present.       Left: No inguinal adenopathy present.  Neurological: She is alert and oriented to person, place, and time. She has normal strength. No cranial nerve deficit. Gait normal. GCS eye subscore is 4. GCS verbal subscore is 5. GCS motor subscore is 6.  Reflex Scores:      Tricep reflexes are 2+ on the right side and 2+ on the left side.      Bicep reflexes are 2+ on the right side and 2+ on the left side.      Patellar reflexes are 2+ on the right side and 2+ on the left side.      Achilles reflexes are 2+ on the right side and 2+ on the left side. Skin: Skin is warm and dry.    Data Reviewed GYN evaluation of July 05, 346 with Ardeth Perfect, PA. Negative pelvic ultrasound.  June 28, 2017 CT abdomen and pelvis: IMPRESSION: No acute findings in the abdomen/pelvis.  Ventriculoperitoneal shunt with tip over the left lower quadrant intact.  Nonobstructing punctate lower pole right renal stone unchanged.  Aortic Atherosclerosis (ICD10-I70.0).  CBC obtained today showed a white blood cell count of 6100, normal differential. Hemoglobin of 12.1. MCV 89.  C-reactive protein elevated 12.0. Sedimentation rate normal at  20.  Competency metabolic panel notable for an elevated sodium of 145 (upper limit of normal 144) see. Normal electrolytes. Creatinine 0.97 with an estimated GFR of 69. Liver function notable for a mild elevation of the SGOT and SGPT at 55 and 41 respectively. Alkaline phosphatase 121. Normal bilirubin at 0.5. Modest elevation of liver function studies is new from July 2018.  Neurology notes of 06/27/2017 with Karen Kitchens, MD at [facility].  Assessment    Unexplained abdominal pain.  Recurrent headaches in patient with previous subdural hematoma.  Diffuse myalgias.    Plan    Etiology for the patient's symptoms is unclear. She has been encouraged to contact her neurologist at [facility] for early assessment. At this time, based on her clinical exam today and review of her neurology exam of October 10 I don't believe she needs a stat head CT, but early neurology assessment based on her past history Will be appropriate.  Difficult to explain her liver function abnormalities based on normal cholangiograms obtained at the time of her cholecystectomy on 04/09/2017. No upper abdominal symptoms to suggest pancreatitis.  Will ask for an add-on lipase to be sure this is not overlooked.  Assessment with her PCP for evaluation of her diffuse myalgias encouraged.     HPI, Physical Exam, Assessment and Plan have been scribed under the direction and in the presence of Hervey Ard, MD.  Gaspar Cola, CMA  I have completed the exam and reviewed the above documentation for accuracy and completeness.  I agree with the above.  Haematologist has been used and any errors in dictation  or transcription are unintentional.  Hervey Ard, [name], F.A.C.S.  Robert Bellow 07/11/2017, 7:09 AM

## 2017-07-11 LAB — CBC WITH DIFFERENTIAL/PLATELET
BASOS ABS: 0 10*3/uL (ref 0.0–0.2)
BASOS: 1 %
EOS (ABSOLUTE): 0.2 10*3/uL (ref 0.0–0.4)
EOS: 3 %
HEMATOCRIT: 36.2 % (ref 34.0–46.6)
HEMOGLOBIN: 12.1 g/dL (ref 11.1–15.9)
Immature Grans (Abs): 0 10*3/uL (ref 0.0–0.1)
Immature Granulocytes: 0 %
LYMPHS: 29 %
Lymphocytes Absolute: 1.8 10*3/uL (ref 0.7–3.1)
MCH: 29.7 pg (ref 26.6–33.0)
MCHC: 33.4 g/dL (ref 31.5–35.7)
MCV: 89 fL (ref 79–97)
MONOCYTES: 7 %
Monocytes Absolute: 0.4 10*3/uL (ref 0.1–0.9)
NEUTROS ABS: 3.7 10*3/uL (ref 1.4–7.0)
Neutrophils: 60 %
Platelets: 251 10*3/uL (ref 150–379)
RBC: 4.08 x10E6/uL (ref 3.77–5.28)
RDW: 14.8 % (ref 12.3–15.4)
WBC: 6.1 10*3/uL (ref 3.4–10.8)

## 2017-07-11 LAB — COMPREHENSIVE METABOLIC PANEL
ALT: 55 IU/L — AB (ref 0–32)
AST: 41 IU/L — AB (ref 0–40)
Albumin/Globulin Ratio: 1.7 (ref 1.2–2.2)
Albumin: 4.3 g/dL (ref 3.5–5.5)
Alkaline Phosphatase: 121 IU/L — ABNORMAL HIGH (ref 39–117)
BUN/Creatinine Ratio: 9 (ref 9–23)
BUN: 9 mg/dL (ref 6–24)
Bilirubin Total: 0.5 mg/dL (ref 0.0–1.2)
CALCIUM: 9.5 mg/dL (ref 8.7–10.2)
CO2: 25 mmol/L (ref 20–29)
CREATININE: 0.97 mg/dL (ref 0.57–1.00)
Chloride: 105 mmol/L (ref 96–106)
GFR calc Af Amer: 80 mL/min/{1.73_m2} (ref >59)
GFR, EST NON AFRICAN AMERICAN: 69 mL/min/{1.73_m2} (ref >59)
GLOBULIN, TOTAL: 2.5 g/dL (ref 1.5–4.5)
Glucose: 99 mg/dL (ref 65–99)
Potassium: 3.9 mmol/L (ref 3.5–5.2)
SODIUM: 145 mmol/L — AB (ref 134–144)
Total Protein: 6.8 g/dL (ref 6.0–8.5)

## 2017-07-11 LAB — C-REACTIVE PROTEIN: CRP: 12 mg/L — AB (ref 0.0–4.9)

## 2017-07-11 LAB — SPECIMEN STATUS REPORT

## 2017-07-11 LAB — SEDIMENTATION RATE: Sed Rate: 20 mm/hr (ref 0–32)

## 2017-07-12 ENCOUNTER — Telehealth: Payer: Self-pay | Admitting: *Deleted

## 2017-07-12 DIAGNOSIS — Z79899 Other long term (current) drug therapy: Secondary | ICD-10-CM | POA: Diagnosis not present

## 2017-07-12 DIAGNOSIS — M79604 Pain in right leg: Secondary | ICD-10-CM | POA: Diagnosis not present

## 2017-07-12 DIAGNOSIS — Z885 Allergy status to narcotic agent status: Secondary | ICD-10-CM | POA: Diagnosis not present

## 2017-07-12 DIAGNOSIS — M79605 Pain in left leg: Secondary | ICD-10-CM | POA: Diagnosis not present

## 2017-07-12 DIAGNOSIS — R1011 Right upper quadrant pain: Secondary | ICD-10-CM | POA: Diagnosis not present

## 2017-07-12 DIAGNOSIS — G44209 Tension-type headache, unspecified, not intractable: Secondary | ICD-10-CM | POA: Diagnosis not present

## 2017-07-12 DIAGNOSIS — I10 Essential (primary) hypertension: Secondary | ICD-10-CM | POA: Diagnosis not present

## 2017-07-12 DIAGNOSIS — Z882 Allergy status to sulfonamides status: Secondary | ICD-10-CM | POA: Diagnosis not present

## 2017-07-12 DIAGNOSIS — R1031 Right lower quadrant pain: Secondary | ICD-10-CM | POA: Diagnosis not present

## 2017-07-12 DIAGNOSIS — R51 Headache: Secondary | ICD-10-CM | POA: Diagnosis not present

## 2017-07-12 DIAGNOSIS — E785 Hyperlipidemia, unspecified: Secondary | ICD-10-CM | POA: Diagnosis not present

## 2017-07-12 DIAGNOSIS — R52 Pain, unspecified: Secondary | ICD-10-CM | POA: Diagnosis not present

## 2017-07-12 DIAGNOSIS — Z880 Allergy status to penicillin: Secondary | ICD-10-CM | POA: Diagnosis not present

## 2017-07-12 NOTE — Telephone Encounter (Signed)
She called asking about her labs. Reviewed information. She is aware to contact her primary care since liver enzymes are slightly elevated. She has left a message with her neurologist regarding her headaches. She states her headache is some better today but still has the abdominal pain. Agrees with plan.

## 2017-07-13 LAB — SPECIMEN STATUS REPORT

## 2017-07-13 LAB — LIPASE: LIPASE: 40 U/L (ref 14–72)

## 2017-07-16 DIAGNOSIS — R0609 Other forms of dyspnea: Secondary | ICD-10-CM | POA: Diagnosis not present

## 2017-07-16 DIAGNOSIS — R918 Other nonspecific abnormal finding of lung field: Secondary | ICD-10-CM | POA: Diagnosis not present

## 2017-07-16 DIAGNOSIS — J9809 Other diseases of bronchus, not elsewhere classified: Secondary | ICD-10-CM | POA: Diagnosis not present

## 2017-07-16 DIAGNOSIS — G4733 Obstructive sleep apnea (adult) (pediatric): Secondary | ICD-10-CM | POA: Diagnosis not present

## 2017-07-19 DIAGNOSIS — S93401A Sprain of unspecified ligament of right ankle, initial encounter: Secondary | ICD-10-CM | POA: Diagnosis not present

## 2017-08-06 DIAGNOSIS — R51 Headache: Secondary | ICD-10-CM | POA: Diagnosis not present

## 2017-08-06 DIAGNOSIS — I208 Other forms of angina pectoris: Secondary | ICD-10-CM | POA: Diagnosis not present

## 2017-08-06 DIAGNOSIS — K219 Gastro-esophageal reflux disease without esophagitis: Secondary | ICD-10-CM | POA: Diagnosis not present

## 2017-08-06 DIAGNOSIS — G919 Hydrocephalus, unspecified: Secondary | ICD-10-CM | POA: Diagnosis not present

## 2017-08-06 DIAGNOSIS — Z01818 Encounter for other preprocedural examination: Secondary | ICD-10-CM | POA: Diagnosis not present

## 2017-08-06 DIAGNOSIS — G4719 Other hypersomnia: Secondary | ICD-10-CM | POA: Diagnosis not present

## 2017-08-06 DIAGNOSIS — R0602 Shortness of breath: Secondary | ICD-10-CM | POA: Diagnosis not present

## 2017-08-06 DIAGNOSIS — G4733 Obstructive sleep apnea (adult) (pediatric): Secondary | ICD-10-CM | POA: Diagnosis not present

## 2017-08-06 DIAGNOSIS — R569 Unspecified convulsions: Secondary | ICD-10-CM | POA: Diagnosis not present

## 2017-08-06 DIAGNOSIS — E782 Mixed hyperlipidemia: Secondary | ICD-10-CM | POA: Diagnosis not present

## 2017-08-06 DIAGNOSIS — I10 Essential (primary) hypertension: Secondary | ICD-10-CM | POA: Diagnosis not present

## 2017-08-30 ENCOUNTER — Other Ambulatory Visit: Payer: Self-pay | Admitting: Allergy and Immunology

## 2017-09-03 DIAGNOSIS — R350 Frequency of micturition: Secondary | ICD-10-CM | POA: Insufficient documentation

## 2017-09-03 HISTORY — DX: Frequency of micturition: R35.0

## 2017-09-04 ENCOUNTER — Other Ambulatory Visit: Payer: Self-pay | Admitting: Allergy and Immunology

## 2017-09-09 ENCOUNTER — Encounter: Payer: Self-pay | Admitting: Emergency Medicine

## 2017-09-09 ENCOUNTER — Emergency Department
Admission: EM | Admit: 2017-09-09 | Discharge: 2017-09-09 | Disposition: A | Discharge status: Home / Self Care | Payer: PPO | Attending: Emergency Medicine | Admitting: Emergency Medicine

## 2017-09-09 ENCOUNTER — Emergency Department: Payer: PPO

## 2017-09-09 ENCOUNTER — Other Ambulatory Visit: Payer: Self-pay

## 2017-09-09 DIAGNOSIS — Z79899 Other long term (current) drug therapy: Secondary | ICD-10-CM | POA: Diagnosis not present

## 2017-09-09 DIAGNOSIS — R1084 Generalized abdominal pain: Secondary | ICD-10-CM | POA: Diagnosis not present

## 2017-09-09 DIAGNOSIS — J45909 Unspecified asthma, uncomplicated: Secondary | ICD-10-CM | POA: Diagnosis not present

## 2017-09-09 DIAGNOSIS — R05 Cough: Secondary | ICD-10-CM | POA: Diagnosis not present

## 2017-09-09 DIAGNOSIS — R0602 Shortness of breath: Secondary | ICD-10-CM | POA: Diagnosis not present

## 2017-09-09 DIAGNOSIS — R197 Diarrhea, unspecified: Secondary | ICD-10-CM | POA: Diagnosis present

## 2017-09-09 DIAGNOSIS — B349 Viral infection, unspecified: Secondary | ICD-10-CM | POA: Diagnosis not present

## 2017-09-09 LAB — COMPREHENSIVE METABOLIC PANEL
ALT: 21 U/L (ref 14–54)
AST: 22 U/L (ref 15–41)
Albumin: 3.9 g/dL (ref 3.5–5.0)
Alkaline Phosphatase: 80 U/L (ref 38–126)
Anion gap: 5 (ref 5–15)
BILIRUBIN TOTAL: 0.6 mg/dL (ref 0.3–1.2)
BUN: 11 mg/dL (ref 6–20)
CO2: 26 mmol/L (ref 22–32)
CREATININE: 0.97 mg/dL (ref 0.44–1.00)
Calcium: 9 mg/dL (ref 8.9–10.3)
Chloride: 108 mmol/L (ref 101–111)
GFR calc Af Amer: >60 mL/min (ref >60)
Glucose, Bld: 105 mg/dL — ABNORMAL HIGH (ref 65–99)
POTASSIUM: 3.2 mmol/L — AB (ref 3.5–5.1)
Sodium: 139 mmol/L (ref 135–145)
TOTAL PROTEIN: 7.1 g/dL (ref 6.5–8.1)

## 2017-09-09 LAB — URINALYSIS, COMPLETE (UACMP) WITH MICROSCOPIC
BILIRUBIN URINE: NEGATIVE
Bacteria, UA: NONE SEEN
GLUCOSE, UA: NEGATIVE mg/dL
Hgb urine dipstick: NEGATIVE
KETONES UR: NEGATIVE mg/dL
LEUKOCYTES UA: NEGATIVE
NITRITE: NEGATIVE
PH: 7 (ref 5.0–8.0)
Protein, ur: NEGATIVE mg/dL
RBC / HPF: NONE SEEN RBC/hpf (ref 0–5)
Specific Gravity, Urine: 1.004 — ABNORMAL LOW (ref 1.005–1.030)

## 2017-09-09 LAB — CBC
HCT: 36.3 % (ref 35.0–47.0)
Hemoglobin: 12.7 g/dL (ref 12.0–16.0)
MCH: 29.8 pg (ref 26.0–34.0)
MCHC: 35.2 g/dL (ref 32.0–36.0)
MCV: 84.6 fL (ref 80.0–100.0)
PLATELETS: 234 10*3/uL (ref 150–440)
RBC: 4.28 MIL/uL (ref 3.80–5.20)
RDW: 14.6 % — AB (ref 11.5–14.5)
WBC: 6.4 10*3/uL (ref 3.6–11.0)

## 2017-09-09 LAB — TROPONIN I: Troponin I: <0.03 ng/mL (ref <0.03)

## 2017-09-09 LAB — LIPASE, BLOOD: Lipase: 31 U/L (ref 11–51)

## 2017-09-09 MED ORDER — ALBUTEROL SULFATE HFA 108 (90 BASE) MCG/ACT IN AERS: 2.0000 | INHALATION_SPRAY | Freq: Four times a day (QID) | RESPIRATORY_TRACT | 0 refills | Status: DC | PRN

## 2017-09-09 MED ORDER — ONDANSETRON HCL 4 MG PO TABS: 4.0000 mg | ORAL_TABLET | Freq: Every day | ORAL | 0 refills | Status: DC | PRN

## 2017-09-09 MED ORDER — DICYCLOMINE HCL 20 MG PO TABS
20.0000 mg | ORAL_TABLET | Freq: Three times a day (TID) | ORAL | 0 refills | Status: DC | PRN
Start: 2017-09-09 — End: 2021-07-08

## 2017-09-09 MED ORDER — SODIUM CHLORIDE 0.9 % IV BOLUS (SEPSIS)
1000.0000 mL | Freq: Once | INTRAVENOUS | Status: AC
  Administered 2017-09-09: 1000 mL via INTRAVENOUS

## 2017-09-09 MED ORDER — KETOROLAC TROMETHAMINE 30 MG/ML IJ SOLN
30.0000 mg | Freq: Once | INTRAMUSCULAR | Status: AC
  Administered 2017-09-09: 30 mg via INTRAVENOUS
  Filled 2017-09-09: qty 1

## 2017-09-09 MED ORDER — ONDANSETRON HCL 4 MG/2ML IJ SOLN
4.0000 mg | Freq: Once | INTRAMUSCULAR | Status: AC
  Administered 2017-09-09: 4 mg via INTRAVENOUS
  Filled 2017-09-09: qty 2

## 2017-09-09 NOTE — ED Provider Notes (Signed)
[facility] Emergency Department Provider Note   ____________________________________________   First MD Initiated Contact with Patient 09/09/17 [phone]     (approximate)  I have reviewed the triage vital signs and the nursing notes.   HISTORY  Chief Complaint Abdominal Pain and Shortness of Breath   HPI [name] is a 46 y.o. female [name] is a 46 y.o. female with a history of depression, anxiety as well as hyperlipidemia who is presenting to the emergency department today with 2 weeks of diarrhea, nausea vomiting abdominal cramping, sore throat as well as cough with mucus production.  She says that she has greater than 5 episodes of diarrhea per day.  No recent antibiotics except for a Z-Pak which she had after the diarrhea began.  She was given a Z-Pak by her primary care doctor told her to report to the emergency department if her symptoms worsened.  Patient said that she last vomited this morning.  Does not report any burning with urination.  Past Medical History:  Diagnosis Date  . Anxiety   . Arthritis   . Asthma   . Depression   . GERD (gastroesophageal reflux disease)   . H. pylori infection 2015  . Herpes genitalia   . Hyperlipidemia   . Lupus    Dr. Lucky Cowboy informed pt she does not have lupus  . Seizures ([place])    last seizures 4-5 years ago.  . 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 Right lower quadrant abdominal pain 06/29/2017  . 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 Rectal bleeding 11/17/2014  . Fibroadenoma of left breast 02/12/2014  . Mastalgia 01/27/2014    Past Surgical History:  Procedure Laterality Date  . ABDOMINAL HYSTERECTOMY  2008  . BRAIN SURGERY  02/2016   removal of 2 blood clotts from the brain.  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 BREAST EXCISIONAL BIOPSY Left 01/2014   lymph node  . BREAST SURGERY Left 02/04/14   left core bx  identifying a fibroadenoma and excision of left axillary lymph node, benign  . BURR HOLE FOR SUBDURAL HEMATOMA  March 18, 2016  . CHOLECYSTECTOMY N/A 04/09/2017   Procedure: LAPAROSCOPIC CHOLECYSTECTOMY WITH INTRAOPERATIVE CHOLANGIOGRAM;  Surgeon: Robert Bellow, MD;  Location: [facility] ORS;  Service: General;  Laterality: N/A;  . COLONOSCOPY  12/21/2015  . COLONOSCOPY W/ BIOPSIES  12/08/2016   Tubular adenoma the sigmoid. No dysplasia. Benign colonic biopsies without evidence of colitis. [name], [name] [facility]  . FRACTURE SURGERY Right 2005   hand  . LAPAROSCOPIC REVISION VENTRICULAR-PERITONEAL (V-P) SHUNT  2000  . LEG SURGERY Left 2002  . NECK SURGERY  1985  . POSTERIOR LAMINECTOMY THORACIC AND LUMBAR SPINE Bilateral 1985   Scoliosis stabilization  . SHUNT REVISION  2003 & July 2017  . SPINE SURGERY  1985   Scoliosis  . TUBAL LIGATION  1995  . UPPER GI ENDOSCOPY  12/08/2016   Hypertrophic gastric polyp, mild chronic gastritis. No evidence of H. pylori. [name], [name], [facility]    Prior to Admission medications   Medication Sig Start Date End Date Taking? Authorizing Provider  albuterol (PROAIR HFA) 108 (90 Base) MCG/ACT inhaler Inhale two puffs every four to six hours as needed for cough or wheeze. 08/03/16   Kozlow, Donnamarie Poag, MD  atorvastatin (LIPITOR) 40 MG tablet Take 40 mg by mouth  daily. In am. 01/23/17   [provider]  buPROPion (WELLBUTRIN SR) 100 MG 12 hr tablet Take 100 mg by mouth daily. In am    [provider]  cetirizine (ZYRTEC) 10 MG tablet Take 10 mg by mouth daily. In am    [provider]  citalopram (CELEXA) 20 MG tablet Take 20 mg by mouth daily. In am. 12/29/15   [provider]  Fluticasone Furoate (ARNUITY ELLIPTA) 200 MCG/ACT AEPB Inhale 1 Dose into the lungs daily. 08/03/16   Kozlow, Donnamarie Poag, MD  levETIRAcetam (KEPPRA) 500 MG tablet Take 500 mg by mouth. 03/20/16 03/25/16  [provider]  losartan-hydrochlorothiazide Konrad Penta) 50-12.5 MG tablet Take one tablet once daily 11/16/16   Kozlow, Donnamarie Poag, MD  montelukast (SINGULAIR) 10 MG tablet TAKE ONE TABLET BY MOUTH AT BEDTIME 02/15/17   Kozlow, Donnamarie Poag, MD  ondansetron (ZOFRAN) 4 MG tablet Take 1 tablet (4 mg total) by mouth every 8 (eight) hours as needed for nausea or vomiting. 07/10/17   Robert Bellow, MD  pantoprazole (PROTONIX) 40 MG tablet TAKE ONE TABLET BY MOUTH ONCE DAILY 11/01/16   Kozlow, Donnamarie Poag, MD  potassium chloride 20 MEQ TBCR Take 20 mEq by mouth daily. 04/09/17   Robert Bellow, MD  ranitidine (ZANTAC) 300 MG tablet Take 1 tablet (300 mg total) by mouth at bedtime. 08/03/16   Kozlow, Donnamarie Poag, MD  rizatriptan (MAXALT) 10 MG tablet Take 1 tablet by mouth daily as needed for migraine.  01/19/14   [provider]  topiramate (TOPAMAX) 100 MG tablet Take 100 mg by mouth daily.     [provider]  valACYclovir (VALTREX) 500 MG tablet Take 500 mg by mouth daily.  10/22/13   [provider]    Allergies Levofloxacin; Penicillins; Sumatriptan; Clindamycin; Codeine; Diclofenac potassium; and Sulfa antibiotics  Family History  Problem Relation Age of Onset  . Leukemia Mother 57  . Colon polyps Mother   . Lung cancer Father   . Leukemia Maternal Aunt   . Brain cancer Sister     Social History Social History   Tobacco Use  . Smoking status: Never Smoker  . Smokeless tobacco: Never Used  Substance Use Topics  . Alcohol use: No  . Drug use: No    Review of Systems  Constitutional: Says fever when the illness began but has not a fever over the past several days Eyes: No visual changes. ENT: No sore throat. Cardiovascular: Denies chest pain. Respiratory: As above  gastrointestinal: No constipation. Genitourinary: Negative for dysuria. Musculoskeletal: Body aches Skin: Negative for rash. Neurological: Negative for headaches, focal weakness or  numbness.  ____________________________________________   PHYSICAL EXAM:  VITAL SIGNS: ED Triage Vitals  Enc Vitals Group     BP 09/09/17 0840 (!) 133/95     Pulse Rate 09/09/17 0840 77     Resp 09/09/17 0840 16     Temp 09/09/17 0840 98.2 F (36.8 C)     Temp Source 09/09/17 0840 Oral     SpO2 09/09/17 0840 100 %     Weight --      Height --      Head Circumference --      Peak Flow --      Pain Score 09/09/17 0843 8     Pain Loc --      Pain Edu? --      Excl. in [place]? --    Constitutional: Alert and oriented. Well appearing and  in no acute distress. Eyes: Conjunctivae are normal.  Head: Atraumatic.   Nose: No congestion/rhinnorhea. Mouth/Throat: Mucous membranes are moist.  No tonsillar or uvular swelling. Neck: No stridor.   Cardiovascular: Normal rate, regular rhythm. Grossly normal heart sounds.   Respiratory: Normal respiratory effort.  No retractions. Lungs CTAB. Gastrointestinal: Soft with mild and diffuse tenderness to palpation. No distention.  Musculoskeletal: No lower extremity tenderness nor edema.  No joint effusions. Neurologic:  Normal speech and language. No gross focal neurologic deficits are appreciated. Skin:  Skin is warm, dry and intact. No rash noted. Psychiatric: Mood and affect are normal. Speech and behavior are normal.  ____________________________________________   LABS (all labs ordered are listed, but only abnormal results are displayed)  Labs Reviewed  COMPREHENSIVE METABOLIC PANEL - Abnormal; Notable for the following components:      Result Value   Potassium 3.2 (*)    Glucose, Bld 105 (*)    All other components within normal limits  CBC - Abnormal; Notable for the following components:   RDW 14.6 (*)    All other components within normal limits  URINALYSIS, COMPLETE (UACMP) WITH MICROSCOPIC - Abnormal; Notable for the following components:   Color, Urine STRAW (*)    APPearance CLEAR (*)    Specific Gravity, Urine 1.004 (*)     Squamous Epithelial / LPF 0-5 (*)    All other components within normal limits  LIPASE, BLOOD  TROPONIN I   ____________________________________________  EKG  ED ECG REPORT I, Doran Stabler, the attending physician, personally viewed and interpreted this ECG.   Date: 09/09/2017  EKG Time: 0850  Rate: 64  Rhythm: normal sinus rhythm  Axis: Dermal  Intervals:Incomplete right bundle branch block  ST&T Change: T wave inversions in 3, aVF as well as V2 through V6 without any ST elevation or depression.no change from previous  ____________________________________________  RADIOLOGY  No acute process on cxr ____________________________________________   PROCEDURES  Procedure(s) performed:   Procedures  Critical Care performed:   ____________________________________________   INITIAL IMPRESSION / ASSESSMENT AND PLAN / ED COURSE  Pertinent labs & imaging results that were available during my care of the patient were reviewed by me and considered in my medical decision making (see chart for details).  Differential diagnosis includes, but is not limited to, ovarian cyst, ovarian torsion, acute appendicitis, diverticulitis, urinary tract infection/pyelonephritis, endometriosis, bowel obstruction, colitis, renal colic, gastroenteritis, hernia, fibroids, endometriosis, pregnancy related pain including ectopic pregnancy, etc. Differential diagnosis includes, but is not limited to, acute appendicitis, renal colic, testicular torsion, urinary tract infection/pyelonephritis, prostatitis,  epididymitis, diverticulitis, small bowel obstruction or ileus, colitis, abdominal aortic aneurysm, gastroenteritis, hernia, etc. Viral syndrome, uri    ----------------------------------------- 1:54 PM on 09/09/2017 ----------------------------------------- Patient with very reassuring lab work.  Tolerating p.o. fluids.  Negative urine.  Has not had any episodes of diarrhea since being  in the emergency department.  No vomiting in the emergency department.  Likely ongoing viral syndrome.  She will be discharged with a prescription for Zofran as she says she only has 2 tabs at home.  She is also requesting refill of her inhaler prescription.  We will also discharged with Bentyl for abdominal cramping which is most likely caused by her recurrent vomiting and diarrhea.  She is understanding of the diagnosis as well as the treatment plan willing to comply.   ____________________________________________   FINAL CLINICAL IMPRESSION(S) / ED DIAGNOSES  Viral syndrome.  Abdominal pain.    NEW MEDICATIONS STARTED DURING  THIS VISIT:  This SmartLink is deprecated. Use AVSMEDLIST instead to display the medication list for a patient.   Note:  This document was prepared using Dragon voice recognition software and may include unintentional dictation errors.     Orbie Pyo, MD 09/09/17 [phone]

## 2017-09-09 NOTE — ED Triage Notes (Signed)
Pt to ED via POV, pt states that she has been sick for over a week. Pt has multiple medical complaints. Pt states that she has had abdominal pain and diarrhea, shortness of breath, cough, congestion, "lung pain", chest pain, decreased appetite, and weakness. Pt was seen by her PCP on 09/03/17 and was told if she did not get any better to come to the ED. Pt in NAD at this time, Vital signs WNL.

## 2017-09-12 ENCOUNTER — Ambulatory Visit (INDEPENDENT_AMBULATORY_CARE_PROVIDER_SITE_OTHER): Payer: PPO | Admitting: Obstetrics and Gynecology

## 2017-09-12 ENCOUNTER — Encounter: Payer: Self-pay | Admitting: Obstetrics and Gynecology

## 2017-09-12 VITALS — BP 126/86 | Ht 64.0 in | Wt 219.0 lb

## 2017-09-12 DIAGNOSIS — R300 Dysuria: Secondary | ICD-10-CM

## 2017-09-12 DIAGNOSIS — R102 Pelvic and perineal pain: Secondary | ICD-10-CM | POA: Diagnosis not present

## 2017-09-12 DIAGNOSIS — K921 Melena: Secondary | ICD-10-CM

## 2017-09-12 DIAGNOSIS — B373 Candidiasis of vulva and vagina: Secondary | ICD-10-CM | POA: Diagnosis not present

## 2017-09-12 DIAGNOSIS — B3731 Acute candidiasis of vulva and vagina: Secondary | ICD-10-CM

## 2017-09-12 LAB — POCT URINALYSIS DIPSTICK
Bilirubin, UA: NEGATIVE
Glucose, UA: NEGATIVE
KETONES UA: NEGATIVE
NITRITE UA: NEGATIVE
PH UA: 5 (ref 5.0–8.0)
RBC UA: NEGATIVE
Spec Grav, UA: 1.02 (ref 1.010–1.025)
UROBILINOGEN UA: 0.2 U/dL

## 2017-09-12 LAB — HEMOCCULT GUIAC POC 1CARD (OFFICE): Fecal Occult Blood, POC: NEGATIVE

## 2017-09-12 MED ORDER — TERCONAZOLE 0.4 % VA CREA: 1.0000 | TOPICAL_CREAM | Freq: Every day | VAGINAL | 0 refills | Status: DC

## 2017-09-12 NOTE — Progress Notes (Signed)
Chief Complaint  Patient presents with  . Pelvic Pain    right side ovarian pain and lower mid abdominal pain    HPI:      [name] is a 46 y.o. [id number] who LMP was No LMP recorded. Patient has had a hysterectomy., presents today for increased vag d/c, odor, and irritation for the past wk. Pt also notes dysuria without frequency/urgency. Pt was on abx a few wks ago. No meds to treat. Had neg C&S 10/18 with small leuks on dip.   Pt also with chronic abd and pelvic pain issues. (SEE 10/18 NOTES IN Epic) Pt had lap chole with LOA around VP shunt with Dr. Bary Castilla 7/18. Had neg CT abd and pelvis 10/18 for RUQ/pelvic pain and neg GYN u/s with me at that time. Pt is s/p TVH. Bilat ovar were WNL on recent u/s. Pt states RUQ and RLQ pain resolved at that time but recurred about 3 wks ago. Pain is sharp in RUQ and radiates to RLQ and suprapubic area. She has then had subsequent n/v/d with hematemesis and hematochezia. She has been eating a bland diet and able to keep that down. Also with fever of 103 when abd pain started. She went to ED 09/09/17. Given zofran, had neg labs, including lipase. She states she had upper GI and colonoscopy about a yr ago with Dr. Melina Copa, GI in [place], and everything was normal. Pt usually with constipation.  Pt states she had her esophagus stretched with GI last yr and now feels like there are "hairs in her throat" that she can't get out.   Past Medical History:  Diagnosis Date  . Anxiety   . Arthritis   . Asthma   . Depression   . GERD (gastroesophageal reflux disease)   . H. pylori infection 2015  . Herpes genitalia   . Hyperlipidemia   . Lupus    Dr. Lucky Cowboy informed pt she does not have lupus  . Seizures ([place])    last seizures 4-5 years ago.  . 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 ABDOMINAL HYSTERECTOMY  2008  . BRAIN SURGERY  02/2016   removal of 2 blood clotts from the brain.    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 BREAST EXCISIONAL BIOPSY Left 01/2014   lymph node  . BREAST SURGERY Left 02/04/14   left core bx identifying a fibroadenoma and excision of left axillary lymph node, benign  . BURR HOLE FOR SUBDURAL HEMATOMA  March 18, 2016  . CHOLECYSTECTOMY N/A 04/09/2017   Procedure: LAPAROSCOPIC CHOLECYSTECTOMY WITH INTRAOPERATIVE CHOLANGIOGRAM;  Surgeon: Robert Bellow, MD;  Location: [facility] ORS;  Service: General;  Laterality: N/A;  . COLONOSCOPY  12/21/2015  . COLONOSCOPY W/ BIOPSIES  12/08/2016   Tubular adenoma the sigmoid. No dysplasia. Benign colonic biopsies without evidence of colitis. [name], [name] [facility]  . FRACTURE SURGERY Right 2005   hand  . LAPAROSCOPIC REVISION VENTRICULAR-PERITONEAL (V-P) SHUNT  2000  . LEG SURGERY Left 2002  . NECK SURGERY  1985  . POSTERIOR LAMINECTOMY THORACIC AND LUMBAR SPINE Bilateral 1985   Scoliosis stabilization  . SHUNT REVISION  2003 & July 2017  . SPINE SURGERY  1985   Scoliosis  . TUBAL LIGATION  1995  . UPPER GI ENDOSCOPY  12/08/2016   Hypertrophic gastric polyp, mild chronic gastritis. No evidence of H. pylori. [name], [name], [facility]    Family History  Problem Relation Age of Onset  . Leukemia Mother 32  . Colon polyps Mother   . Lung cancer Father   . Leukemia Maternal Aunt   . Brain cancer Sister     Social History   Socioeconomic History  . Marital status: Married    Spouse name: Not on file  . Number of children: Not on file  . Years of education: Not on file  . Highest education level: Not on file  Social Needs  . Financial resource strain: Not on file  . Food insecurity - worry: Not on file  . Food insecurity - inability: Not on file  . Transportation needs - medical: Not on file  . Transportation needs - non-medical: Not on file  Occupational History  . Not on file  Tobacco Use  . Smoking status: Never Smoker  . Smokeless  tobacco: Never Used  Substance and Sexual Activity  . Alcohol use: No  . Drug use: No  . Sexual activity: Not Currently  Other Topics Concern  . Not on file  Social History Narrative  . Not on file     Current Outpatient Medications:  .  albuterol (PROVENTIL HFA;VENTOLIN HFA) 108 (90 Base) MCG/ACT inhaler, Inhale 2 puffs into the lungs every 6 (six) hours as needed., Disp: 1 Inhaler, Rfl: 0 .  atorvastatin (LIPITOR) 40 MG tablet, Take 40 mg by mouth daily. In am., Disp: , Rfl:  .  buPROPion (WELLBUTRIN SR) 100 MG 12 hr tablet, Take 100 mg by mouth daily. In am, Disp: , Rfl:  .  cetirizine (ZYRTEC) 10 MG tablet, Take 10 mg by mouth daily. In am, Disp: , Rfl:  .  citalopram (CELEXA) 20 MG tablet, Take 20 mg by mouth daily. In am., Disp: , Rfl:  .  dicyclomine (BENTYL) 20 MG tablet, Take 1 tablet (20 mg total) by mouth 3 (three) times daily as needed for spasms., Disp: 30 tablet, Rfl: 0 .  Fluticasone Furoate (ARNUITY ELLIPTA) 200 MCG/ACT AEPB, Inhale 1 Dose into the lungs daily., Disp: 30 each, Rfl: 5 .  ibuprofen (ADVIL,MOTRIN) 800 MG tablet, , Disp: , Rfl:  .  losartan-hydrochlorothiazide (HYZAAR) 50-12.5 MG tablet, Take one tablet once daily, Disp: 90 tablet, Rfl: 0 .  montelukast (SINGULAIR) 10 MG tablet, TAKE ONE TABLET BY MOUTH AT BEDTIME, Disp: 30 tablet, Rfl: 0 .  ondansetron (ZOFRAN) 4 MG tablet, Take 1 tablet (4 mg total) by mouth daily as needed., Disp: 10 tablet, Rfl: 0 .  pantoprazole (PROTONIX) 40 MG tablet, TAKE ONE TABLET BY MOUTH ONCE DAILY, Disp: 30 tablet, Rfl: 0 .  potassium chloride 20 MEQ TBCR, Take 20 mEq by mouth daily., Disp: 30 tablet, Rfl: 0 .  ranitidine (ZANTAC) 300 MG tablet, Take 1 tablet (300 mg total) by mouth at bedtime., Disp: 30 tablet, Rfl: 5 .  rizatriptan (MAXALT) 10 MG tablet, Take 1 tablet by mouth daily as needed for migraine. , Disp: , Rfl:  .  SYMBICORT 160-4.5 MCG/ACT inhaler, , Disp: , Rfl:  .  topiramate (TOPAMAX) 100 MG tablet, , Disp: , Rfl:   .  valACYclovir (VALTREX) 500 MG tablet, , Disp: , Rfl:  .  terconazole (TERAZOL 7) 0.4 % vaginal cream, Place 1 applicator vaginally at bedtime., Disp: 45 g, Rfl: 0   ROS:  Review of Systems  Constitutional: Positive for fatigue and fever. Negative for unexpected weight change.  Respiratory: Positive for shortness of breath. Negative for cough and  wheezing.   Cardiovascular: Negative for chest pain, palpitations and leg swelling.  Gastrointestinal: Positive for blood in stool, diarrhea, nausea and vomiting. Negative for constipation.  Endocrine: Negative for cold intolerance, heat intolerance and polyuria.  Genitourinary: Positive for dysuria, frequency and vaginal discharge. Negative for dyspareunia, flank pain, genital sores, hematuria, menstrual problem, pelvic pain, urgency, vaginal bleeding and vaginal pain.  Musculoskeletal: Positive for arthralgias. Negative for back pain, joint swelling and myalgias.  Skin: Negative for rash.  Neurological: Positive for headaches. Negative for dizziness, syncope, light-headedness and numbness.  Hematological: Negative for adenopathy.  Psychiatric/Behavioral: Positive for agitation. Negative for confusion, sleep disturbance and suicidal ideas. The patient is not nervous/anxious.      OBJECTIVE:   Vitals:  BP 126/86   Ht 5\' 4"  (1.626 m)   Wt 219 lb (99.3 kg)   BMI 37.59 kg/m   Physical Exam  Constitutional: She is oriented to person, place, and time and well-developed, well-nourished, and in no distress. Vital signs are normal.  Abdominal: Soft. Normal appearance. There is tenderness in the right upper quadrant and right lower quadrant. There is rebound.    RUQ and RLQ PAIN WITH PALPATION  Genitourinary: Left adnexa normal and vulva normal. Right adnexum displays tenderness. Right adnexum displays no mass. Left adnexum displays no mass and no tenderness. Vulva exhibits no erythema, no exudate, no lesion, no rash and no tenderness. Vagina  exhibits no lesion. Thin  white and vaginal discharge found.  Genitourinary Comments: GRADE 2 RECTOCELE  Musculoskeletal: Normal range of motion.  Neurological: She is oriented to person, place, and time.  Vitals reviewed.   Results: Results for orders placed or performed in visit on 09/12/17 (from the past 24 hour(s))  POCT Urinalysis Dipstick     Status: Abnormal   Collection Time: 09/12/17  3:31 PM  Result Value Ref Range   Color, UA yellow    Clarity, UA clear    Glucose, UA negative    Bilirubin, UA negative    Ketones, UA negative    Spec Grav, UA 1.020 1.010 - 1.025   Blood, UA negative    pH, UA 5.0 5.0 - 8.0   Protein, UA trace    Urobilinogen, UA 0.2 0.2 or 1.0 E.U./dL   Nitrite, UA negative    Leukocytes, UA Small (1+) (A) Negative   Appearance     Odor    POCT Occult Blood Stool     Status: Normal   Collection Time: 09/12/17  5:12 PM  Result Value Ref Range   Fecal Occult Blood, POC Negative Negative   Card #1 Date     Card #2 Fecal Occult Blod, POC     Card #2 Date     Card #3 Fecal Occult Blood, POC     Card #3 Date       Assessment/Plan: Candidal vaginitis - Pos wet prep. Rx terazol 7. F/u prn. Most likely due to recent abx use.  - Plan: terconazole (TERAZOL 7) 0.4 % vaginal cream  Dysuria - Neg dip. Most likely related to yeast vag.  Pelvic pain - Not GYN related. Pt to f/u with GI. If neg eval, can f/u with gen surg for poss dx lap.  - Plan: POCT Urinalysis Dipstick  Hematochezia - Neg FOBT. Refer back to GI. - Plan: POCT Occult Blood Stool    Meds ordered this encounter  Medications  . terconazole (TERAZOL 7) 0.4 % vaginal cream    Sig: Place 1 applicator vaginally at bedtime.  Dispense:  45 g    Refill:  0    Pls tell pt 7 day crm eRxd instead of 3 day crm due to insurance benefits      Return if symptoms worsen or fail to improve.  Alicia B. Copland, PA-C 09/13/2017 8:39 AM

## 2017-09-12 NOTE — Patient Instructions (Signed)
I value your feedback and entrusting us with your care. If you get a [facility] patient survey, I would appreciate you taking the time to let us know about your experience today. Thank you! 

## 2017-09-13 ENCOUNTER — Encounter: Payer: Self-pay | Admitting: Obstetrics and Gynecology

## 2017-09-20 DIAGNOSIS — J0120 Acute ethmoidal sinusitis, unspecified: Secondary | ICD-10-CM | POA: Diagnosis not present

## 2017-09-25 DIAGNOSIS — H6121 Impacted cerumen, right ear: Secondary | ICD-10-CM | POA: Diagnosis not present

## 2017-09-25 DIAGNOSIS — Z8719 Personal history of other diseases of the digestive system: Secondary | ICD-10-CM | POA: Diagnosis not present

## 2017-09-25 DIAGNOSIS — K219 Gastro-esophageal reflux disease without esophagitis: Secondary | ICD-10-CM | POA: Diagnosis not present

## 2017-09-25 DIAGNOSIS — R0989 Other specified symptoms and signs involving the circulatory and respiratory systems: Secondary | ICD-10-CM | POA: Diagnosis not present

## 2017-09-25 DIAGNOSIS — R112 Nausea with vomiting, unspecified: Secondary | ICD-10-CM | POA: Diagnosis not present

## 2017-09-25 DIAGNOSIS — J342 Deviated nasal septum: Secondary | ICD-10-CM | POA: Diagnosis not present

## 2017-09-25 DIAGNOSIS — R070 Pain in throat: Secondary | ICD-10-CM | POA: Diagnosis not present

## 2017-10-02 DIAGNOSIS — R079 Chest pain, unspecified: Secondary | ICD-10-CM | POA: Diagnosis not present

## 2017-10-02 DIAGNOSIS — F332 Major depressive disorder, recurrent severe without psychotic features: Secondary | ICD-10-CM | POA: Diagnosis not present

## 2017-10-02 DIAGNOSIS — F411 Generalized anxiety disorder: Secondary | ICD-10-CM | POA: Diagnosis not present

## 2017-10-03 DIAGNOSIS — R9431 Abnormal electrocardiogram [ECG] [EKG]: Secondary | ICD-10-CM | POA: Diagnosis not present

## 2017-10-03 DIAGNOSIS — R001 Bradycardia, unspecified: Secondary | ICD-10-CM | POA: Diagnosis not present

## 2017-10-05 DIAGNOSIS — H47033 Optic nerve hypoplasia, bilateral: Secondary | ICD-10-CM | POA: Diagnosis not present

## 2017-10-16 DIAGNOSIS — J4520 Mild intermittent asthma, uncomplicated: Secondary | ICD-10-CM | POA: Diagnosis not present

## 2017-10-16 DIAGNOSIS — G4733 Obstructive sleep apnea (adult) (pediatric): Secondary | ICD-10-CM | POA: Diagnosis not present

## 2017-10-16 DIAGNOSIS — R0609 Other forms of dyspnea: Secondary | ICD-10-CM | POA: Diagnosis not present

## 2017-10-25 DIAGNOSIS — S161XXA Strain of muscle, fascia and tendon at neck level, initial encounter: Secondary | ICD-10-CM | POA: Diagnosis not present

## 2017-10-25 DIAGNOSIS — S46912A Strain of unspecified muscle, fascia and tendon at shoulder and upper arm level, left arm, initial encounter: Secondary | ICD-10-CM | POA: Diagnosis not present

## 2017-10-29 ENCOUNTER — Other Ambulatory Visit: Payer: Self-pay

## 2017-10-29 ENCOUNTER — Encounter: Payer: Self-pay | Admitting: Internal Medicine

## 2017-10-29 ENCOUNTER — Ambulatory Visit (INDEPENDENT_AMBULATORY_CARE_PROVIDER_SITE_OTHER): Payer: PPO | Admitting: Internal Medicine

## 2017-10-29 VITALS — BP 130/78 | HR 71 | Ht 63.0 in | Wt 218.4 lb

## 2017-10-29 DIAGNOSIS — G4710 Hypersomnia, unspecified: Secondary | ICD-10-CM | POA: Diagnosis not present

## 2017-10-29 DIAGNOSIS — J301 Allergic rhinitis due to pollen: Secondary | ICD-10-CM

## 2017-10-29 DIAGNOSIS — T7840XA Allergy, unspecified, initial encounter: Secondary | ICD-10-CM | POA: Insufficient documentation

## 2017-10-29 DIAGNOSIS — R0602 Shortness of breath: Secondary | ICD-10-CM | POA: Diagnosis not present

## 2017-10-29 DIAGNOSIS — R1011 Right upper quadrant pain: Secondary | ICD-10-CM | POA: Diagnosis not present

## 2017-10-29 HISTORY — DX: Allergy, unspecified, initial encounter: T78.40XA

## 2017-10-29 NOTE — Progress Notes (Signed)
[facility] [place], [place] [address]  Pulmonary Sleep Medicine  Office Visit Note  Patient Name: [name] DOB: 03/24/1971 MRN [id number]  Date of Service: 10/29/2017  Complaints/HPI: She has history of SOB and also has had Pulmonary nodules. She was seeing another pulmonologist and was worked up found to have nodules which will need to be followed. She also has some pain in her belly on the right side. She states that she had been seen by surgery also and had some scar tissues removed. Patient states she also has aches and pains. She has concerns for leukemia and had begged her pcp for a workup. She states that she has not seen anyone in the pain specialty field other than in [place]. She states that she was told it is fibromyalgia and she states that it is not fibromyalgia. As far as her lungs are concerned she has SOB all the time. She feels that she is weak in the legs. Patient states that she has had falls. She is seeing her PCP for this. She has had multiple ED visits also for her pain in addition to walk in clinics. 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 I have asked she have this done and again  ROS  General: (-) fever, (-) chills, (-) night sweats, (-) weakness Skin: (-) rashes, (-) itching,. Eyes: (-) visual changes, (-) redness, (-) itching. Nose and Sinuses: (-) nasal stuffiness or itchiness, (-) postnasal drip, (-) nosebleeds, (-) sinus trouble. Mouth and Throat: (-) sore throat, (-) hoarseness. Neck: (-) swollen glands, (-) enlarged thyroid, (-) neck pain. Respiratory: - cough, (-) bloody sputum, + shortness of breath, - wheezing. Cardiovascular: - ankle swelling, (-) chest pain. Lymphatic: (-) lymph node enlargement. Neurologic: (-) numbness, (-) tingling. Psychiatric: (-) anxiety, (-) depression   Current Medication: Outpatient Encounter Medications as of 10/29/2017  Medication Sig  .  albuterol (PROVENTIL HFA;VENTOLIN HFA) 108 (90 Base) MCG/ACT inhaler Inhale 2 puffs into the lungs every 6 (six) hours as needed.  [name] buPROPion (WELLBUTRIN SR) 100 MG 12 hr tablet Take 100 mg by mouth daily. In am  . cetirizine (ZYRTEC) 10 MG tablet Take 10 mg by mouth daily. In am  . citalopram (CELEXA) 20 MG tablet Take 20 mg by mouth daily. In am.  . dicyclomine (BENTYL) 20 MG tablet Take 1 tablet (20 mg total) by mouth 3 (three) times daily as needed for spasms. (Patient taking differently: Take 20 mg by mouth 3 (three) times daily as needed (abdominal pain). )  . losartan-hydrochlorothiazide (HYZAAR) 50-12.5 MG tablet Take one tablet once daily (Patient taking differently: Take 1 tablet by mouth every morning. Take one tablet once daily)  . montelukast (SINGULAIR) 10 MG tablet TAKE ONE TABLET BY MOUTH AT BEDTIME  . pantoprazole (PROTONIX) 40 MG tablet TAKE ONE TABLET BY MOUTH ONCE DAILY  . ranitidine (ZANTAC) 300 MG tablet Take 1 tablet (300 mg total) by mouth at bedtime.  . topiramate (TOPAMAX) 100 MG tablet Take 100 mg by mouth 2 (two) times daily.   . valACYclovir (VALTREX) 500 MG tablet Take 500 mg by mouth daily.   [name] atorvastatin (LIPITOR) 40 MG tablet Take 40 mg by mouth daily. In am.  . cefdinir (OMNICEF) 300 MG capsule   . cyclobenzaprine (FLEXERIL) 10 MG tablet   . Fluticasone Furoate (ARNUITY ELLIPTA) 200 MCG/ACT AEPB Inhale 1 Dose into the lungs daily. (Patient not  taking: Reported on 10/29/2017)  . ibuprofen (ADVIL,MOTRIN) 600 MG tablet   . ibuprofen (ADVIL,MOTRIN) 800 MG tablet   . ondansetron (ZOFRAN) 4 MG tablet Take 1 tablet (4 mg total) by mouth daily as needed. (Patient not taking: Reported on 10/29/2017)  . potassium chloride 20 MEQ TBCR Take 20 mEq by mouth daily. (Patient not taking: Reported on 10/29/2017)  . rizatriptan (MAXALT) 10 MG tablet Take 1 tablet by mouth daily as needed for migraine.   . SYMBICORT 160-4.5 MCG/ACT inhaler   . terconazole (TERAZOL 7) 0.4 % vaginal  cream Place 1 applicator vaginally at bedtime. (Patient not taking: Reported on 10/29/2017)   No facility-administered encounter medications on file as of 10/29/2017.     Surgical History: Past Surgical History:  Procedure Laterality Date  . ABDOMINAL HYSTERECTOMY  2008  . BRAIN SURGERY  02/2016   removal of 2 blood clotts from the brain.  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 BREAST EXCISIONAL BIOPSY Left 01/2014   lymph node  . BREAST SURGERY Left 02/04/14   left core bx identifying a fibroadenoma and excision of left axillary lymph node, benign  . BURR HOLE FOR SUBDURAL HEMATOMA  March 18, 2016  . CHOLECYSTECTOMY N/A 04/09/2017   Procedure: LAPAROSCOPIC CHOLECYSTECTOMY WITH INTRAOPERATIVE CHOLANGIOGRAM;  Surgeon: Robert Bellow, MD;  Location: [facility] ORS;  Service: General;  Laterality: N/A;  . COLONOSCOPY  12/21/2015  . COLONOSCOPY W/ BIOPSIES  12/08/2016   Tubular adenoma the sigmoid. No dysplasia. Benign colonic biopsies without evidence of colitis. [name], [name] [facility]  . FRACTURE SURGERY Right 2005   hand  . LAPAROSCOPIC REVISION VENTRICULAR-PERITONEAL (V-P) SHUNT  2000  . LEG SURGERY Left 2002  . NECK SURGERY  1985  . POSTERIOR LAMINECTOMY THORACIC AND LUMBAR SPINE Bilateral 1985   Scoliosis stabilization  . SHUNT REVISION  2003 & July 2017  . SPINE SURGERY  1985   Scoliosis  . TUBAL LIGATION  1995  . UPPER GI ENDOSCOPY  12/08/2016   Hypertrophic gastric polyp, mild chronic gastritis. No evidence of H. pylori. [name], [name], [facility]    Medical History: Past Medical History:  Diagnosis Date  . Anxiety   . Arthritis   . Asthma   . Depression   . GERD (gastroesophageal reflux disease)   . H. pylori infection 2015  . Herpes genitalia   . Hyperlipidemia   . Lupus    Dr. Lucky Cowboy informed pt she does not have lupus  . Seizures ([place])    last seizures 4-5 years ago.  . Subdural hematoma [facility]) July 2017    Bilateral    Family History: Family History  Problem Relation Age of Onset  . Leukemia Mother 32  . Colon polyps Mother   . Lung cancer Father   . Leukemia Maternal Aunt   . Brain cancer Sister     Social History: Social History   Socioeconomic History  . Marital status: Married    Spouse name: Not on file  . Number of children: Not on file  . Years of education: Not on file  . Highest education level: Not on file  Social Needs  . Financial resource strain: Not on file  . Food insecurity - worry: Not on file  . Food insecurity - inability: Not on file  . Transportation needs - medical: Not on file  . Transportation needs - non-medical: Not on file  Occupational History  . Not on file  Tobacco Use  .  Smoking status: Never Smoker  . Smokeless tobacco: Never Used  Substance and Sexual Activity  . Alcohol use: No  . Drug use: No  . Sexual activity: Not Currently  Other Topics Concern  . Not on file  Social History Narrative  . Not on file    Vital Signs: Blood pressure 130/78, pulse 71, height _0  (1.6 m), weight 218 lb 6.4 oz (99.1 kg), SpO2 96 %.  Examination: General Appearance: The patient is well-developed, well-nourished, and in no distress. Skin: Gross inspection of skin unremarkable. Head: normocephalic, no gross deformities. Eyes: no gross deformities noted. ENT: ears appear grossly normal no exudates. Neck: Supple. No thyromegaly. No LAD. Respiratory: clear no rhonchi. Cardiovascular: Normal S1 and S2 without murmur or rub. Extremities: No cyanosis. pulses are equal. Neurologic: Alert and oriented. No involuntary movements.  LABS: Recent Results (from the past 2160 hour(s))  Lipase, blood     Status: None   Collection Time: 09/09/17  8:47 AM  Result Value Ref Range   Lipase 31 11 - 51 U/L    Comment: Performed at [facility], [address] [place], [place] [address]  Comprehensive metabolic panel     Status: Abnormal   Collection  Time: 09/09/17  8:47 AM  Result Value Ref Range   Sodium 139 135 - 145 mmol/L   Potassium 3.2 (L) 3.5 - 5.1 mmol/L   Chloride 108 101 - 111 mmol/L   CO2 26 22 - 32 mmol/L   Glucose, Bld 105 (H) 65 - 99 mg/dL   BUN 11 6 - 20 mg/dL   Creatinine, Ser 0.97 0.44 - 1.00 mg/dL   Calcium 9.0 8.9 - 10.3 mg/dL   Total Protein 7.1 6.5 - 8.1 g/dL   Albumin 3.9 3.5 - 5.0 g/dL   AST 22 15 - 41 U/L   ALT 21 14 - 54 U/L   Alkaline Phosphatase 80 38 - 126 U/L   Total Bilirubin 0.6 0.3 - 1.2 mg/dL   GFR calc non Af Amer >60 >60 mL/min   GFR calc Af Amer >60 >60 mL/min    Comment: (NOTE) The eGFR has been calculated using the CKD EPI equation. This calculation has not been validated in all clinical situations. eGFR's persistently <60 mL/min signify possible Chronic Kidney Disease.    Anion gap 5 5 - 15    Comment: Performed at [facility], [address] [place], [place] [address]  CBC     Status: Abnormal   Collection Time: 09/09/17  8:47 AM  Result Value Ref Range   WBC 6.4 3.6 - 11.0 K/uL   RBC 4.28 3.80 - 5.20 MIL/uL   Hemoglobin 12.7 12.0 - 16.0 g/dL   HCT 36.3 35.0 - 47.0 %   MCV 84.6 80.0 - 100.0 fL   MCH 29.8 26.0 - 34.0 pg   MCHC 35.2 32.0 - 36.0 g/dL   RDW 14.6 (H) 11.5 - 14.5 %   Platelets 234 150 - 440 K/uL    Comment: Performed at [facility], [address] [place], [place] [address]  Troponin I     Status: None   Collection Time: 09/09/17  8:47 AM  Result Value Ref Range   Troponin I <0.03 <0.03 ng/mL    Comment: Performed at [facility], [address] [place], [place] [address]  Urinalysis, Complete w Microscopic     Status: Abnormal   Collection Time: 09/09/17 12:17 PM  Result Value Ref Range   Color, Urine  STRAW (A) YELLOW   APPearance CLEAR (A) CLEAR   Specific Gravity, Urine 1.004 (L) 1.005 - 1.030   pH 7.0 5.0 - 8.0   Glucose, UA NEGATIVE NEGATIVE mg/dL   Hgb urine dipstick NEGATIVE NEGATIVE   Bilirubin Urine NEGATIVE  NEGATIVE   Ketones, ur NEGATIVE NEGATIVE mg/dL   Protein, ur NEGATIVE NEGATIVE mg/dL   Nitrite NEGATIVE NEGATIVE   Leukocytes, UA NEGATIVE NEGATIVE   RBC / HPF NONE SEEN 0 - 5 RBC/hpf   WBC, UA 0-5 0 - 5 WBC/hpf   Bacteria, UA NONE SEEN NONE SEEN   Squamous Epithelial / LPF 0-5 (A) NONE SEEN    Comment: Performed at [facility], [address] [place], [place] [address]  POCT Urinalysis Dipstick     Status: Abnormal   Collection Time: 09/12/17  3:31 PM  Result Value Ref Range   Color, UA yellow    Clarity, UA clear    Glucose, UA negative    Bilirubin, UA negative    Ketones, UA negative    Spec Grav, UA 1.020 1.010 - 1.025   Blood, UA negative    pH, UA 5.0 5.0 - 8.0   Protein, UA trace    Urobilinogen, UA 0.2 0.2 or 1.0 E.U./dL   Nitrite, UA negative    Leukocytes, UA Small (1+) (A) Negative   Appearance     Odor    POCT Occult Blood Stool     Status: Normal   Collection Time: 09/12/17  5:12 PM  Result Value Ref Range   Fecal Occult Blood, POC Negative Negative   Card #1 Date     Card #2 Fecal Occult Blod, POC     Card #2 Date     Card #3 Fecal Occult Blood, POC     Card #3 Date      Radiology: Dg Chest 2 View  Result Date: 09/09/2017 CLINICAL DATA:  Shortness of breath, productive cough. EXAM: CHEST  2 VIEW COMPARISON:  Radiograph of July 12, 2017. FINDINGS: The heart size and mediastinal contours are within normal limits. Both lungs are clear. No pneumothorax or pleural effusion is noted. Harrington rod is noted in thoracic spine. Right-sided ventriculoperitoneal shunt is again noted. IMPRESSION: No active cardiopulmonary disease. Electronically Signed   By: Marijo Conception, [name]   On: 09/09/2017 09:16    No results found.  No results found.    Assessment and Plan: Patient Active Problem List   Diagnosis Date Noted  . Allergic state 10/29/2017  . Increased frequency of urination 09/03/2017  . Right lower quadrant abdominal pain 06/29/2017  .  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 Screening for breast cancer 11/16/2016  . Plantar fasciitis 11/07/2016  . Tightness of heel cord, left 11/07/2016  . Hyperlipidemia, mixed 05/28/2016  . Prediabetes 05/28/2016  . Epilepsy ([place]) 04/10/2016  . Chronic pain syndrome 01/11/2016  . Osteoarthritis of both knees 01/11/2016  . ANA positive 01/04/2016  . Asthma 01/04/2016  . Connective tissue disease ([place]) 01/04/2016  . Chronic joint pain 12/02/2015  . Allergic rhinitis 11/25/2015  . Internal derangement of right knee 11/25/2015  . Migraine without status migrainosus, not intractable 11/25/2015  . Generalized anxiety disorder 10/27/2015  . Recurrent major depressive disorder, in remission ([place]) 10/27/2015  . Benign hypertension 10/25/2015  . Personal history of healed traumatic fracture 09/15/2015  . Acute sinusitis, unspecified 08/29/2015  . 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 Hydrocephalus 08/29/2015  . 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 Generalized  abdominal pain 08/28/2015  . Weight gain 08/28/2015  . Vitamin D deficiency 06/29/2015  . Rectal bleeding 11/17/2014  . Convulsions ([place]) 03/05/2014  . Dizziness 03/05/2014  . Fibroadenoma of left breast 02/12/2014  . Mastalgia 01/27/2014    1. Shortness of Breath unclear etiology. She will be setup for PFT no need for inahlers at this time 2. Abdominal pain unclear and I would defer this to her PCP. She has had a GI workup with endoscopy in the past 3. Allergic rhinitis will check allergy tests 4. Hypersomnia has OSA not on treatment will get home study  General Counseling: I have discussed the findings of the evaluation and examination with Horris Latino.  I have also discussed any further diagnostic evaluation thatmay be needed or ordered today. Naidelin verbalizes understanding of the findings of todays visit. We also reviewed her medications today and discussed drug interactions and side effects including but not limited excessive  drowsiness and altered mental states. We also discussed that there is always a risk not just to her but also people around her. she has been encouraged to call the office with any questions or concerns that should arise related to todays visit.    Time spent: 64mn  I have personally obtained a history, examined the patient, evaluated laboratory and imaging results, formulated the assessment and plan and placed orders.    SAllyne Gee MD [facility] Hospital[facility] Sleep medicine

## 2017-11-01 ENCOUNTER — Telehealth: Payer: Self-pay

## 2017-11-01 NOTE — Telephone Encounter (Signed)
Per [name] she has already had a sleep study done with Dr. Ra[facility] is requesting notes) at this point she does not want to schedule appointment for now. [name]

## 2017-11-02 DIAGNOSIS — F3340 Major depressive disorder, recurrent, in remission, unspecified: Secondary | ICD-10-CM | POA: Diagnosis not present

## 2017-11-02 DIAGNOSIS — Z23 Encounter for immunization: Secondary | ICD-10-CM | POA: Diagnosis not present

## 2017-11-02 DIAGNOSIS — F411 Generalized anxiety disorder: Secondary | ICD-10-CM | POA: Diagnosis not present

## 2017-11-06 ENCOUNTER — Ambulatory Visit (INDEPENDENT_AMBULATORY_CARE_PROVIDER_SITE_OTHER): Payer: PPO | Admitting: Obstetrics and Gynecology

## 2017-11-06 ENCOUNTER — Telehealth: Payer: Self-pay

## 2017-11-06 ENCOUNTER — Encounter: Payer: Self-pay | Admitting: Obstetrics and Gynecology

## 2017-11-06 VITALS — BP 128/84 | Ht 63.0 in | Wt 224.0 lb

## 2017-11-06 DIAGNOSIS — N898 Other specified noninflammatory disorders of vagina: Secondary | ICD-10-CM

## 2017-11-06 DIAGNOSIS — R1031 Right lower quadrant pain: Secondary | ICD-10-CM | POA: Diagnosis not present

## 2017-11-06 DIAGNOSIS — R300 Dysuria: Secondary | ICD-10-CM | POA: Diagnosis not present

## 2017-11-06 NOTE — Progress Notes (Signed)
Obstetrics & Gynecology Office Visit   Chief Complaint  Patient presents with  . Pelvic Pain   History of Present Illness: 47 y.o. G65P2002 female who presents with pelvic pain.  Her pain is located in her right lower quadrant. She describes it as "ovarian" pain.  The pain is described as "really bad," 8-9/10.  The onset of her pain was two years ago.  However, it has been worse more recently. Her pain comes and goes.  When it comes it lasts for weeks to months. It can go away for months at a time.  Her most recent pain started 2 days ago.  The pain does not radiate.  Tylenol does alleviate the pain (pain goes to a 2-3/10).  Pushing on the area makes the pain worse.  She states that she also has fevers and chills associated with this pain.  She states that two days ago she had a fever of 102.34F.    She [place] notes an urge to urinate. However, when she does attempt to void not much urine comes out.  It does hurt to void. She denies hematuria.  Her symptoms have been present for a couple of days.  Nothing seems to make this better or worse.  Her fever, noted above, does seem to coincide with the onset of her symptoms.   She also noted a new vaginal odor.  The odor has been present for about 1 week.  She denies changes in her discharge. She denies itching, burning, and irritation.  She has not changed detergent, soap.  She has tried douching in the past, but not since the onset of these symptoms.  She has a history of HSV, but notes no new lesions, burning, or tingling.   She requests STD testing.  Of note, the patient has a bit of a rambling historical account. She needs frequent re-direction and frequent rewording of questions in order to obtain clear answers.    Past Medical History:  Diagnosis Date  . Anxiety   . Arthritis   . Asthma   . Depression   . GERD (gastroesophageal reflux disease)   . H. pylori infection 2015  . Herpes genitalia   . Hyperlipidemia   . Hypertension   . Lupus    Dr. Lucky Cowboy informed pt she does not have lupus  . Seizures ([place])    last seizures 4-5 years ago.  . Subdural hematoma [facility]) July 2017   Bilateral   Past Surgical History:  Procedure Laterality Date  . ABDOMINAL HYSTERECTOMY  2008  . BRAIN SURGERY  02/2016   removal of 2 blood clotts from the brain.  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 BREAST EXCISIONAL BIOPSY Left 01/2014   lymph node  . BREAST SURGERY Left 02/04/14   left core bx identifying a fibroadenoma and excision of left axillary lymph node, benign  . BURR HOLE FOR SUBDURAL HEMATOMA  March 18, 2016  . CHOLECYSTECTOMY N/A 04/09/2017   Procedure: LAPAROSCOPIC CHOLECYSTECTOMY WITH INTRAOPERATIVE CHOLANGIOGRAM;  Surgeon: Robert Bellow, MD;  Location: [facility] ORS;  Service: General;  Laterality: N/A;  . COLONOSCOPY  12/21/2015  . COLONOSCOPY W/ BIOPSIES  12/08/2016   Tubular adenoma the sigmoid. No dysplasia. Benign colonic biopsies without evidence of colitis. [name], [name] [facility]  . FRACTURE SURGERY Right 2005   hand  . LAPAROSCOPIC REVISION VENTRICULAR-PERITONEAL (V-P) SHUNT  2000  . LEG SURGERY Left 2002  . NECK SURGERY  1985  . POSTERIOR LAMINECTOMY THORACIC  AND LUMBAR SPINE Bilateral 1985   Scoliosis stabilization  . SHUNT REVISION  2003 & July 2017  . SPINE SURGERY  1985   Scoliosis  . TUBAL LIGATION  1995  . UPPER GI ENDOSCOPY  12/08/2016   Hypertrophic gastric polyp, mild chronic gastritis. No evidence of H. pylori. [name], [name], [facility]    Gynecologic History: No LMP recorded. Patient has had a hysterectomy.  Obstetric History: [id number]  Family History  Problem Relation Age of Onset  . Leukemia Mother 50  . Colon polyps Mother   . Lung cancer Father   . Leukemia Maternal Aunt   . Brain cancer Sister     Social History   Socioeconomic History  . Marital status: Married    Spouse name: Not on file  . Number of children: Not on file  . Years  of education: Not on file  . Highest education level: Not on file  Social Needs  . Financial resource strain: Not on file  . Food insecurity - worry: Not on file  . Food insecurity - inability: Not on file  . Transportation needs - medical: Not on file  . Transportation needs - non-medical: Not on file  Occupational History  . Not on file  Tobacco Use  . Smoking status: Never Smoker  . Smokeless tobacco: Never Used  Substance and Sexual Activity  . Alcohol use: No  . Drug use: No  . Sexual activity: Not Currently  Other Topics Concern  . Not on file  Social History Narrative  . Not on file    Allergies  Allergen Reactions  . Doxycycline Hyclate Anaphylaxis    Patient reports her throat closes up. Patient reports her throat closes up.  . Levofloxacin Anaphylaxis  . Morphine Hives, Itching and Other (See Comments)    Urinary incontinence.  . Penicillins Shortness Of Breath and Rash    Has patient had a PCN reaction causing immediate rash, facial/tongue/throat swelling, SOB or lightheadedness with hypotension: Yes Has patient had a PCN reaction causing severe rash involving mucus membranes or skin necrosis: Unknown Has patient had a PCN reaction that required hospitalization: No Has patient had a PCN reaction occurring within the last 10 years: No If all of the above answers are "NO", then may proceed with Cephalosporin use.  Has patient had a PCN reaction causing immediate rash, facial/tongue/throat swelling, SOB or lightheadedness with hypotension: Yes Has patient had a PCN reaction causing severe rash involving mucus membranes or skin necrosis: Unknown Has patient had a PCN reaction that required hospitalization: No Has patient had a PCN reaction occurring within the last 10 years: No If all of the above answers are "NO", then may proceed with Cephalosporin use. Has patient had a PCN reaction causing immediate rash,   . Sulfa Antibiotics Swelling, Nausea And Vomiting and  Other (See Comments)    Rash and throat swelling  . Sumatriptan Rash    diarrhea  . Clindamycin Diarrhea  . Codeine Swelling  . Diclofenac Potassium     Other reaction(s): GI Upset (intolerance)    Home Medications   Medication Sig Start Date End Date Taking? Authorizing Provider  albuterol (PROVENTIL HFA;VENTOLIN HFA) 108 (90 Base) MCG/ACT inhaler Inhale 2 puffs into the lungs every 6 (six) hours as needed. 09/09/17   Orbie Pyo, MD  atorvastatin (LIPITOR) 40 MG tablet Take 40 mg by mouth daily. In am. 01/23/17   [provider]  buPROPion (WELLBUTRIN SR) 100 MG 12 hr tablet  Take 100 mg by mouth daily. In am    [provider]  cefdinir (OMNICEF) 300 MG capsule  09/20/17   [provider]  cetirizine (ZYRTEC) 10 MG tablet Take 10 mg by mouth daily. In am    [provider]  citalopram (CELEXA) 20 MG tablet Take 20 mg by mouth daily. In am. 12/29/15   [provider]  cyclobenzaprine (FLEXERIL) 10 MG tablet  10/25/17   [provider]  dicyclomine (BENTYL) 20 MG tablet Take 1 tablet (20 mg total) by mouth 3 (three) times daily as needed for spasms. Patient taking differently: Take 20 mg by mouth 3 (three) times daily as needed (abdominal pain).  09/09/17 09/09/18  Orbie Pyo, MD  ibuprofen (ADVIL,MOTRIN) 600 MG tablet  10/25/17   [provider]  ibuprofen (ADVIL,MOTRIN) 800 MG tablet  07/20/17   [provider]  losartan-hydrochlorothiazide (HYZAAR) 50-12.5 MG tablet Take one tablet once daily Patient taking differently: Take 1 tablet by mouth every morning. Take one tablet once daily 11/16/16   Kozlow, Donnamarie Poag, MD  montelukast (SINGULAIR) 10 MG tablet TAKE ONE TABLET BY MOUTH AT BEDTIME 02/15/17   Kozlow, Donnamarie Poag, MD  pantoprazole (PROTONIX) 40 MG tablet TAKE ONE TABLET BY MOUTH ONCE DAILY 11/01/16   Kozlow, Donnamarie Poag, MD  potassium chloride 20 MEQ TBCR Take 20 mEq by mouth daily. Patient not taking:  Reported on 10/29/2017 04/09/17   Robert Bellow, MD  ranitidine (ZANTAC) 300 MG tablet Take 1 tablet (300 mg total) by mouth at bedtime. 08/03/16   Kozlow, Donnamarie Poag, MD  rizatriptan (MAXALT) 10 MG tablet Take 1 tablet by mouth daily as needed for migraine.  01/19/14   [provider]  SYMBICORT 160-4.5 MCG/ACT inhaler  07/16/17   [provider]  topiramate (TOPAMAX) 100 MG tablet Take 100 mg by mouth 2 (two) times daily.  06/27/17   [provider]  valACYclovir (VALTREX) 500 MG tablet Take 500 mg by mouth daily.  08/30/17   [provider]    Review of Systems  Constitutional: Positive for chills and fever. Negative for diaphoresis, malaise/fatigue and weight loss.  HENT: Negative.   Eyes: Negative.   Respiratory: Negative.   Cardiovascular: Negative.   Gastrointestinal: Positive for abdominal pain (see HPI). Negative for blood in stool, constipation, diarrhea, heartburn, melena, nausea and vomiting.  Genitourinary: Positive for dysuria, frequency and urgency. Negative for flank pain and hematuria.  Musculoskeletal: Negative.   Skin: Negative for itching and rash.  Neurological: Negative.  Negative for weakness.  Psychiatric/Behavioral: Negative.      Physical Exam BP 128/84   Ht 5\' 3"  (1.6 m)   Wt 224 lb (101.6 kg)   BMI 39.68 kg/m  No LMP recorded. Patient has had a hysterectomy. Physical Exam  Constitutional: She is oriented to person, place, and time. She appears well-developed and well-nourished. No distress.  Genitourinary: Vagina normal. Pelvic exam was performed with patient supine. There is no rash, tenderness or lesion on the right labia. There is no rash, tenderness or lesion on the left labia. Vagina exhibits no lesion. No erythema, tenderness or bleeding in the vagina. No signs of injury around the vagina. No vaginal discharge found. Right adnexum does not display mass, does not display tenderness and does not display fullness. Left  adnexum does not display mass, does not display tenderness and does not display fullness. Cervix does not exhibit motion tenderness, lesion or discharge.   Uterus is tender (mild RLQ)  and mobile. Uterus is not enlarged or exhibiting a mass.  Genitourinary Comments: Bimanual exam limited by body habitus  HENT:  Head: Normocephalic and atraumatic.  Eyes: Conjunctivae are normal. No scleral icterus.  Neck: Normal range of motion. Neck supple.  Cardiovascular: Normal rate and regular rhythm.  Pulmonary/Chest: Effort normal and breath sounds normal. No respiratory distress.  Abdominal: Soft. Bowel sounds are normal. She exhibits no distension and no mass. There is no tenderness. There is no guarding.  Abdominal exam limited by body habitus  Musculoskeletal: Normal range of motion. She exhibits no edema or deformity.  Neurological: She is alert and oriented to person, place, and time. No cranial nerve deficit.  Skin: Skin is warm and dry. No erythema.  Psychiatric: She has a normal mood and affect. Her behavior is normal.   Female chaperone present for pelvic and breast  portions of the physical exam  Wet Prep: PH: 4.5 Clue Cells: Negative Fungal elements: Negative Trichomonas: Negative  Assessment: 47 y.o. G39P2002 female here for  1. Right lower quadrant abdominal pain   2. Dysuria   3. Vaginal odor      Plan: Problem List Items Addressed This Visit      Other   Right lower quadrant abdominal pain - Primary   Relevant Orders   Urine Culture (Completed)   Chlamydia/Gonococcus/Trichomonas, NAA   US PELVIS TRANSVANGINAL NON-OB (TV ONLY)   HIV antibody (with reflex) (Completed)    Other Visit Diagnoses    Dysuria       Relevant Orders   Urine Culture (Completed)   Chlamydia/Gonococcus/Trichomonas, NAA   POCT Wet Prep with KOH (Completed)   Vaginal odor       Relevant Orders   Urine Culture (Completed)   Chlamydia/Gonococcus/Trichomonas, NAA   POCT Wet Prep with KOH  (Completed)     The patient presents with several new issues.  Workup as noted above.  For her pain, will get a pelvic ultrasound.  STD testing also performed, as well as urine culture.  Wet prep is normal.   No obvious source for vaginal odor.    Return in about 2 weeks (around 11/20/2017) for schedule pelvic u/s and follow up Dr Glennon Mac.   Prentice Docker, MD 11/10/2017 12:09 PM

## 2017-11-06 NOTE — Telephone Encounter (Signed)
Left message advising patient of her CT Chest scan appt.[place]

## 2017-11-07 LAB — HIV ANTIBODY (ROUTINE TESTING W REFLEX): HIV Screen 4th Generation wRfx: NONREACTIVE

## 2017-11-08 LAB — URINE CULTURE

## 2017-11-09 ENCOUNTER — Ambulatory Visit
Admission: RE | Admit: 2017-11-09 | Discharge: 2017-11-09 | Disposition: A | Discharge status: Home / Self Care | Payer: PPO | Source: Ambulatory Visit | Attending: Internal Medicine | Admitting: Internal Medicine

## 2017-11-09 DIAGNOSIS — R0602 Shortness of breath: Secondary | ICD-10-CM

## 2017-11-09 DIAGNOSIS — R918 Other nonspecific abnormal finding of lung field: Secondary | ICD-10-CM | POA: Diagnosis not present

## 2017-11-09 HISTORY — DX: Essential (primary) hypertension: I10

## 2017-11-09 MED ORDER — IOPAMIDOL (ISOVUE-300) INJECTION 61%
75.0000 mL | Freq: Once | INTRAVENOUS | Status: AC | PRN
  Administered 2017-11-09: 75 mL via INTRAVENOUS

## 2017-11-10 ENCOUNTER — Encounter: Payer: Self-pay | Admitting: Obstetrics and Gynecology

## 2017-11-10 LAB — POCT WET PREP WITH KOH
Clue Cells Wet Prep HPF POC: NEGATIVE
KOH PREP POC: NEGATIVE
PH, VAGINAL: 4.5
Trichomonas, UA: NEGATIVE

## 2017-11-13 DIAGNOSIS — R5382 Chronic fatigue, unspecified: Secondary | ICD-10-CM

## 2017-11-13 DIAGNOSIS — R7303 Prediabetes: Secondary | ICD-10-CM | POA: Diagnosis not present

## 2017-11-13 HISTORY — DX: Chronic fatigue, unspecified: R53.82

## 2017-11-13 LAB — CHLAMYDIA/GONOCOCCUS/TRICHOMONAS, NAA
Chlamydia by NAA: NEGATIVE
GONOCOCCUS BY NAA: NEGATIVE
TRICH VAG BY NAA: NEGATIVE

## 2017-11-22 DIAGNOSIS — H524 Presbyopia: Secondary | ICD-10-CM | POA: Diagnosis not present

## 2017-11-22 DIAGNOSIS — H5213 Myopia, bilateral: Secondary | ICD-10-CM | POA: Diagnosis not present

## 2017-11-23 ENCOUNTER — Ambulatory Visit (INDEPENDENT_AMBULATORY_CARE_PROVIDER_SITE_OTHER): Payer: PPO

## 2017-11-23 ENCOUNTER — Ambulatory Visit (INDEPENDENT_AMBULATORY_CARE_PROVIDER_SITE_OTHER): Payer: PPO | Admitting: Obstetrics and Gynecology

## 2017-11-23 VITALS — BP 128/84 | Ht 63.0 in

## 2017-11-23 DIAGNOSIS — R1031 Right lower quadrant pain: Secondary | ICD-10-CM | POA: Diagnosis not present

## 2017-11-23 NOTE — Progress Notes (Signed)
Gynecology Ultrasound Follow Up   Chief Complaint  Patient presents with  . Follow-up    Right Lower quadrant abdominal pain   History of Present Illness: Patient is a 47 y.o. female who presents today for ultrasound evaluation of the above chief complaint .  Ultrasound demonstrates the following findings Adnexa: no masses seen: normal-appearing ovaries. Uterus: surgically absent. Additional: no additional findings to suggest a specific etiology to her pain  Past Medical History:  Diagnosis Date  . Anxiety   . Arthritis   . Asthma   . Depression   . GERD (gastroesophageal reflux disease)   . H. pylori infection 2015  . Herpes genitalia   . Hyperlipidemia   . Hypertension   . Lupus    Dr. Lucky Cowboy informed pt she does not have lupus  . Seizures ([place])    last seizures 4-5 years ago.  . 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 ABDOMINAL HYSTERECTOMY  2008  . BRAIN SURGERY  02/2016   removal of 2 blood clotts from the brain.  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 BREAST EXCISIONAL BIOPSY Left 01/2014   lymph node  . BREAST SURGERY Left 02/04/14   left core bx identifying a fibroadenoma and excision of left axillary lymph node, benign  . BURR HOLE FOR SUBDURAL HEMATOMA  March 18, 2016  . CHOLECYSTECTOMY N/A 04/09/2017   Procedure: LAPAROSCOPIC CHOLECYSTECTOMY WITH INTRAOPERATIVE CHOLANGIOGRAM;  Surgeon: Robert Bellow, MD;  Location: [facility] ORS;  Service: General;  Laterality: N/A;  . COLONOSCOPY  12/21/2015  . COLONOSCOPY W/ BIOPSIES  12/08/2016   Tubular adenoma the sigmoid. No dysplasia. Benign colonic biopsies without evidence of colitis. [name], [name] [facility]  . FRACTURE SURGERY Right 2005   hand  . LAPAROSCOPIC REVISION VENTRICULAR-PERITONEAL (V-P) SHUNT  2000  . LEG SURGERY Left 2002  . NECK SURGERY  1985  . POSTERIOR LAMINECTOMY THORACIC AND LUMBAR SPINE Bilateral  1985   Scoliosis stabilization  . SHUNT REVISION  2003 & July 2017  . SPINE SURGERY  1985   Scoliosis  . TUBAL LIGATION  1995  . UPPER GI ENDOSCOPY  12/08/2016   Hypertrophic gastric polyp, mild chronic gastritis. No evidence of H. pylori. [name], [name], [facility]    Family History  Problem Relation Age of Onset  . Leukemia Mother 9  . Colon polyps Mother   . Lung cancer Father   . Leukemia Maternal Aunt   . Brain cancer Sister     Social History   Socioeconomic History  . Marital status: Married    Spouse name: Not on file  . Number of children: Not on file  . Years of education: Not on file  . Highest education level: Not on file  Social Needs  . Financial resource strain: Not on file  . Food insecurity - worry: Not on file  . Food insecurity - inability: Not on file  . Transportation needs - medical: Not on file  . Transportation needs - non-medical: Not on file  Occupational History  . Not on file  Tobacco Use  . Smoking status: Never Smoker  . Smokeless tobacco: Never Used  Substance and Sexual Activity  . Alcohol use: No  . Drug use: No  . Sexual activity: Not Currently  Other Topics Concern  . Not on file  Social History Narrative  . Not on file  Allergies  Allergen Reactions  . Doxycycline Hyclate Anaphylaxis    Patient reports her throat closes up. Patient reports her throat closes up.  . Levofloxacin Anaphylaxis  . Morphine Hives, Itching and Other (See Comments)    Urinary incontinence.  . Penicillins Shortness Of Breath and Rash    Has patient had a PCN reaction causing immediate rash, facial/tongue/throat swelling, SOB or lightheadedness with hypotension: Yes Has patient had a PCN reaction causing severe rash involving mucus membranes or skin necrosis: Unknown Has patient had a PCN reaction that required hospitalization: No Has patient had a PCN reaction occurring within the last 10 years: No If all of the above  answers are "NO", then may proceed with Cephalosporin use.  Has patient had a PCN reaction causing immediate rash, facial/tongue/throat swelling, SOB or lightheadedness with hypotension: Yes Has patient had a PCN reaction causing severe rash involving mucus membranes or skin necrosis: Unknown Has patient had a PCN reaction that required hospitalization: No Has patient had a PCN reaction occurring within the last 10 years: No If all of the above answers are "NO", then may proceed with Cephalosporin use. Has patient had a PCN reaction causing immediate rash,   . Sulfa Antibiotics Swelling, Nausea And Vomiting and Other (See Comments)    Rash and throat swelling  . Sumatriptan Rash    diarrhea  . Clindamycin Diarrhea  . Codeine Swelling  . Diclofenac Potassium     Other reaction(s): GI Upset (intolerance)    Prior to Admission medications   Medication Sig Start Date End Date Taking? Authorizing Provider  albuterol (PROVENTIL HFA;VENTOLIN HFA) 108 (90 Base) MCG/ACT inhaler Inhale 2 puffs into the lungs every 6 (six) hours as needed. 09/09/17   Orbie Pyo, MD  atorvastatin (LIPITOR) 40 MG tablet Take 40 mg by mouth daily. In am. 01/23/17   [provider]  buPROPion (WELLBUTRIN SR) 100 MG 12 hr tablet Take 100 mg by mouth daily. In am    [provider]  cefdinir (OMNICEF) 300 MG capsule  09/20/17   [provider]  cetirizine (ZYRTEC) 10 MG tablet Take 10 mg by mouth daily. In am    [provider]  citalopram (CELEXA) 20 MG tablet Take 20 mg by mouth daily. In am. 12/29/15   [provider]  cyclobenzaprine (FLEXERIL) 10 MG tablet  10/25/17   [provider]  dicyclomine (BENTYL) 20 MG tablet Take 1 tablet (20 mg total) by mouth 3 (three) times daily as needed for spasms. Patient taking differently: Take 20 mg by mouth 3 (three) times daily as needed (abdominal pain).  09/09/17 09/09/18  Orbie Pyo, MD  ibuprofen  (ADVIL,MOTRIN) 600 MG tablet  10/25/17   [provider]  ibuprofen (ADVIL,MOTRIN) 800 MG tablet  07/20/17   [provider]  losartan-hydrochlorothiazide (HYZAAR) 50-12.5 MG tablet Take one tablet once daily Patient taking differently: Take 1 tablet by mouth every morning. Take one tablet once daily 11/16/16   Kozlow, Donnamarie Poag, MD  montelukast (SINGULAIR) 10 MG tablet TAKE ONE TABLET BY MOUTH AT BEDTIME 02/15/17   Kozlow, Donnamarie Poag, MD  ondansetron (ZOFRAN) 4 MG tablet Take 1 tablet (4 mg total) by mouth daily as needed. Patient not taking: Reported on 10/29/2017 09/09/17   Orbie Pyo, MD  pantoprazole (PROTONIX) 40 MG tablet TAKE ONE TABLET BY MOUTH ONCE DAILY 11/01/16   Kozlow, Donnamarie Poag, MD  ranitidine (ZANTAC) 300 MG tablet Take 1 tablet (300 mg total) by mouth at  bedtime. 08/03/16   Kozlow, Donnamarie Poag, MD  rizatriptan (MAXALT) 10 MG tablet Take 1 tablet by mouth daily as needed for migraine.  01/19/14   [provider]  SYMBICORT 160-4.5 MCG/ACT inhaler  07/16/17   [provider]  topiramate (TOPAMAX) 100 MG tablet Take 100 mg by mouth 2 (two) times daily.  06/27/17   [provider]  valACYclovir (VALTREX) 500 MG tablet Take 500 mg by mouth daily.  08/30/17   [provider]    Physical Exam BP 128/84   Ht 5\' 3"  (1.6 m)   BMI 39.68 kg/m    General: NAD HEENT: normocephalic, anicteric Pulmonary: No increased work of breathing Extremities: no edema, erythema, or tenderness Neurologic: Grossly intact, normal gait Psychiatric: mood appropriate, affect full   Assessment: 47 y.o. [id number] here for  1. Right lower quadrant abdominal pain     Plan: Problem List Items Addressed This Visit      Other   Right lower quadrant abdominal pain - Primary     Discussed u/s findings and all findings thus far. None of which point to any specific etiology for pain from a gynecologic perspective. Should her symptoms worsen or new symptoms develop  that might suggest a pelvic etiology (vaginal bleeding, bloating, early satiety, constipation, etc.), she should follow up.  Otherwise, follow up, PRN.  Prentice Docker, MD, [facility], [facility] 11/25/2017 10:23 AM

## 2017-11-25 ENCOUNTER — Encounter: Payer: Self-pay | Admitting: Obstetrics and Gynecology

## 2017-12-06 DIAGNOSIS — E782 Mixed hyperlipidemia: Secondary | ICD-10-CM | POA: Diagnosis not present

## 2017-12-06 DIAGNOSIS — R946 Abnormal results of thyroid function studies: Secondary | ICD-10-CM | POA: Insufficient documentation

## 2017-12-06 DIAGNOSIS — R5382 Chronic fatigue, unspecified: Secondary | ICD-10-CM | POA: Diagnosis not present

## 2017-12-06 DIAGNOSIS — M25562 Pain in left knee: Secondary | ICD-10-CM | POA: Insufficient documentation

## 2017-12-06 DIAGNOSIS — I10 Essential (primary) hypertension: Secondary | ICD-10-CM | POA: Diagnosis not present

## 2017-12-06 DIAGNOSIS — F3340 Major depressive disorder, recurrent, in remission, unspecified: Secondary | ICD-10-CM | POA: Diagnosis not present

## 2017-12-06 DIAGNOSIS — R7303 Prediabetes: Secondary | ICD-10-CM | POA: Diagnosis not present

## 2017-12-06 HISTORY — DX: Pain in left knee: M25.562

## 2017-12-06 HISTORY — DX: Abnormal results of thyroid function studies: R94.6

## 2017-12-07 DIAGNOSIS — M25562 Pain in left knee: Secondary | ICD-10-CM | POA: Diagnosis not present

## 2017-12-19 DIAGNOSIS — R45851 Suicidal ideations: Secondary | ICD-10-CM | POA: Diagnosis not present

## 2017-12-19 DIAGNOSIS — F411 Generalized anxiety disorder: Secondary | ICD-10-CM | POA: Diagnosis not present

## 2017-12-19 DIAGNOSIS — Z79899 Other long term (current) drug therapy: Secondary | ICD-10-CM | POA: Diagnosis not present

## 2017-12-19 DIAGNOSIS — K219 Gastro-esophageal reflux disease without esophagitis: Secondary | ICD-10-CM | POA: Diagnosis not present

## 2017-12-19 DIAGNOSIS — F331 Major depressive disorder, recurrent, moderate: Secondary | ICD-10-CM | POA: Diagnosis not present

## 2017-12-19 DIAGNOSIS — I10 Essential (primary) hypertension: Secondary | ICD-10-CM | POA: Diagnosis not present

## 2017-12-19 DIAGNOSIS — F418 Other specified anxiety disorders: Secondary | ICD-10-CM | POA: Diagnosis not present

## 2017-12-27 ENCOUNTER — Other Ambulatory Visit: Payer: Self-pay

## 2017-12-27 DIAGNOSIS — Z0000 Encounter for general adult medical examination without abnormal findings: Secondary | ICD-10-CM | POA: Diagnosis not present

## 2017-12-27 NOTE — Patient Outreach (Signed)
[facility]) Care Management  12/27/2017  [name] [birth year] [id number]     Transition of Care Referral  Referral Date: 12/27/17  Referral Source: [facility] Discharge Report Date of Admission: unknown Diagnosis: unknown Date of Discharge: 12/20/17 Facility: [place]: [facility]     Referral received. No outreach warranted at this time. TOC will be completed by primary care provider office who will refer to [facility] care mgmt if needed.    Plan: RN CM will close case at this time.    Enzo Montgomery, RN,BSN,CCM [place] Management Telephonic Care Management Coordinator Direct Phone: [phone] Toll Free: [phone] Fax: [phone]

## 2018-01-08 DIAGNOSIS — G43009 Migraine without aura, not intractable, without status migrainosus: Secondary | ICD-10-CM | POA: Diagnosis not present

## 2018-01-08 DIAGNOSIS — M7918 Myalgia, other site: Secondary | ICD-10-CM | POA: Diagnosis not present

## 2018-01-08 DIAGNOSIS — G44209 Tension-type headache, unspecified, not intractable: Secondary | ICD-10-CM | POA: Diagnosis not present

## 2018-01-15 ENCOUNTER — Encounter: Payer: Self-pay | Admitting: Emergency Medicine

## 2018-01-15 ENCOUNTER — Emergency Department: Payer: PPO

## 2018-01-15 ENCOUNTER — Other Ambulatory Visit: Payer: Self-pay

## 2018-01-15 ENCOUNTER — Emergency Department
Admission: EM | Admit: 2018-01-15 | Discharge: 2018-01-15 | Disposition: A | Discharge status: Home / Self Care | Payer: PPO | Attending: Emergency Medicine | Admitting: Emergency Medicine

## 2018-01-15 DIAGNOSIS — R109 Unspecified abdominal pain: Secondary | ICD-10-CM | POA: Diagnosis not present

## 2018-01-15 DIAGNOSIS — R1011 Right upper quadrant pain: Secondary | ICD-10-CM | POA: Diagnosis not present

## 2018-01-15 DIAGNOSIS — Z79899 Other long term (current) drug therapy: Secondary | ICD-10-CM | POA: Diagnosis not present

## 2018-01-15 DIAGNOSIS — R51 Headache: Secondary | ICD-10-CM | POA: Insufficient documentation

## 2018-01-15 DIAGNOSIS — J45909 Unspecified asthma, uncomplicated: Secondary | ICD-10-CM | POA: Insufficient documentation

## 2018-01-15 DIAGNOSIS — I10 Essential (primary) hypertension: Secondary | ICD-10-CM | POA: Diagnosis not present

## 2018-01-15 LAB — CBC
HCT: 35.9 % (ref 35.0–47.0)
Hemoglobin: 12.7 g/dL (ref 12.0–16.0)
MCH: 31 pg (ref 26.0–34.0)
MCHC: 35.3 g/dL (ref 32.0–36.0)
MCV: 87.6 fL (ref 80.0–100.0)
PLATELETS: 233 10*3/uL (ref 150–440)
RBC: 4.1 MIL/uL (ref 3.80–5.20)
RDW: 15.3 % — AB (ref 11.5–14.5)
WBC: 7.3 10*3/uL (ref 3.6–11.0)

## 2018-01-15 LAB — URINALYSIS, COMPLETE (UACMP) WITH MICROSCOPIC
Bilirubin Urine: NEGATIVE
Glucose, UA: NEGATIVE mg/dL
Hgb urine dipstick: NEGATIVE
Ketones, ur: NEGATIVE mg/dL
LEUKOCYTES UA: NEGATIVE
Nitrite: NEGATIVE
PH: 6 (ref 5.0–8.0)
Protein, ur: NEGATIVE mg/dL
SPECIFIC GRAVITY, URINE: 1.023 (ref 1.005–1.030)

## 2018-01-15 LAB — COMPREHENSIVE METABOLIC PANEL
ALT: 29 U/L (ref 14–54)
AST: 27 U/L (ref 15–41)
Albumin: 4 g/dL (ref 3.5–5.0)
Alkaline Phosphatase: 72 U/L (ref 38–126)
Anion gap: 5 (ref 5–15)
BUN: 10 mg/dL (ref 6–20)
CHLORIDE: 109 mmol/L (ref 101–111)
CO2: 26 mmol/L (ref 22–32)
CREATININE: 1.1 mg/dL — AB (ref 0.44–1.00)
Calcium: 8.7 mg/dL — ABNORMAL LOW (ref 8.9–10.3)
GFR calc Af Amer: >60 mL/min (ref >60)
GFR, EST NON AFRICAN AMERICAN: 58 mL/min — AB (ref >60)
Glucose, Bld: 110 mg/dL — ABNORMAL HIGH (ref 65–99)
Potassium: 3.6 mmol/L (ref 3.5–5.1)
Sodium: 140 mmol/L (ref 135–145)
Total Bilirubin: 0.6 mg/dL (ref 0.3–1.2)
Total Protein: 7.3 g/dL (ref 6.5–8.1)

## 2018-01-15 LAB — LIPASE, BLOOD: LIPASE: 35 U/L (ref 11–51)

## 2018-01-15 MED ORDER — IOPAMIDOL (ISOVUE-370) INJECTION 76%
75.0000 mL | Freq: Once | INTRAVENOUS | Status: AC | PRN
  Administered 2018-01-15: 100 mL via INTRAVENOUS

## 2018-01-15 MED ORDER — FENTANYL CITRATE (PF) 100 MCG/2ML IJ SOLN
25.0000 ug | Freq: Once | INTRAMUSCULAR | Status: AC
  Administered 2018-01-15: 25 ug via INTRAVENOUS
  Filled 2018-01-15: qty 2

## 2018-01-15 MED ORDER — IOPAMIDOL (ISOVUE-300) INJECTION 61%
30.0000 mL | Freq: Once | INTRAVENOUS | Status: AC | PRN
  Administered 2018-01-15: 30 mL via ORAL

## 2018-01-15 MED ORDER — TRAMADOL HCL 50 MG PO TABS: 50.0000 mg | ORAL_TABLET | Freq: Four times a day (QID) | ORAL | 0 refills | Status: DC | PRN

## 2018-01-15 NOTE — ED Notes (Signed)
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  Alert and oriented, color good.  Declines WC.

## 2018-01-15 NOTE — ED Notes (Signed)
First Nurse Note:  Patient to Rm 3 Kaleigh RN aware of placement.

## 2018-01-15 NOTE — ED Triage Notes (Signed)
Pt to ED via POV c/o headaches, itching all over, and face breaking out. Pt states that she has been having headaches x 1 week. Pt states that she is also having pain in her RUQ. Pt states that it feels similar to when she was having gallbladder issues. Pt states that when they took her gallbladder out they found some scar tissue built up around her shunt tube and this pain feels similar to that. Pt is in NAD at this time.

## 2018-01-15 NOTE — ED Provider Notes (Signed)
[facility] Emergency Department Provider Note   ____________________________________________    I have reviewed the triage vital signs and the nursing notes.   HISTORY  Chief Complaint Headache and Abdominal Pain     HPI [name] is a 47 y.o. female who presents with primary complaint of right-sided abdominal pain.  Patient reports the symptoms have been ongoing for the last week.  She reports it feels similar to when she had her gallbladder taken out.  Patient has a history of a VP shunt for hydrocephalus.  She does report intermittent headaches over the last week as well.  No fevers or chills.  No neck pain.  No nausea or vomiting.  Has not taken anything for her symptoms.   Past Medical History:  Diagnosis Date  . Anxiety   . Arthritis   . Asthma   . Depression   . GERD (gastroesophageal reflux disease)   . H. pylori infection 2015  . Herpes genitalia   . Hyperlipidemia   . Hypertension   . Lupus ([place])    Dr. Lucky Cowboy informed pt she does not have lupus  . Seizures ([place])    last seizures 4-5 years ago.  . Subdural hematoma [facility]) July 2017   Bilateral    Patient Active Problem List   Diagnosis Date Noted  . Allergic state 10/29/2017  . Increased frequency of urination 09/03/2017  . Right lower quadrant abdominal pain 06/29/2017  . 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 Screening for breast cancer 11/16/2016  . Plantar fasciitis 11/07/2016  . Tightness of heel cord, left 11/07/2016  . Hyperlipidemia, mixed 05/28/2016  . Prediabetes 05/28/2016  . Epilepsy ([place]) 04/10/2016  . Chronic pain syndrome 01/11/2016  . Osteoarthritis of both knees 01/11/2016  . ANA positive 01/04/2016  . Asthma 01/04/2016  . Connective tissue disease ([place]) 01/04/2016  . Chronic joint pain 12/02/2015  . Allergic rhinitis 11/25/2015  . Internal derangement of right knee 11/25/2015  . Migraine without status migrainosus, not intractable 11/25/2015  . Generalized  anxiety disorder 10/27/2015  . Recurrent major depressive disorder, in remission ([place]) 10/27/2015  . Benign hypertension 10/25/2015  . Personal history of healed traumatic fracture 09/15/2015  . Acute sinusitis, unspecified 08/29/2015  . 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 Hydrocephalus 08/29/2015  . 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 Generalized abdominal pain 08/28/2015  . Weight gain 08/28/2015  . Vitamin D deficiency 06/29/2015  . Rectal bleeding 11/17/2014  . Convulsions ([place]) 03/05/2014  . Dizziness 03/05/2014  . Fibroadenoma of left breast 02/12/2014  . Mastalgia 01/27/2014    Past Surgical History:  Procedure Laterality Date  . ABDOMINAL HYSTERECTOMY  2008  . BRAIN SURGERY  02/2016   removal of 2 blood clotts from the brain.  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 BREAST EXCISIONAL BIOPSY Left 01/2014   lymph node  . BREAST SURGERY Left 02/04/14   left core bx identifying a fibroadenoma and excision of left axillary lymph node, benign  . BURR HOLE FOR SUBDURAL HEMATOMA  March 18, 2016  . CHOLECYSTECTOMY N/A 04/09/2017   Procedure: LAPAROSCOPIC CHOLECYSTECTOMY WITH INTRAOPERATIVE CHOLANGIOGRAM;  Surgeon: Earlie Arciga Bellow, MD;  Location: [facility] ORS;  Service: General;  Laterality: N/A;  . COLONOSCOPY  12/21/2015  . COLONOSCOPY W/ BIOPSIES  12/08/2016   Tubular adenoma the sigmoid. No dysplasia. Benign colonic biopsies without evidence of colitis. [name], [name] [facility]  . FRACTURE SURGERY Right 2005   hand  .  LAPAROSCOPIC REVISION VENTRICULAR-PERITONEAL (V-P) SHUNT  2000  . LEG SURGERY Left 2002  . NECK SURGERY  1985  . POSTERIOR LAMINECTOMY THORACIC AND LUMBAR SPINE Bilateral 1985   Scoliosis stabilization  . SHUNT REVISION  2003 & July 2017  . SPINE SURGERY  1985   Scoliosis  . TUBAL LIGATION  1995  . UPPER GI ENDOSCOPY  12/08/2016   Hypertrophic gastric polyp, mild chronic gastritis. No evidence of H. pylori.  [name], [name], [facility]    Prior to Admission medications   Medication Sig Start Date End Date Taking? Authorizing Provider  albuterol (PROVENTIL HFA;VENTOLIN HFA) 108 (90 Base) MCG/ACT inhaler Inhale 2 puffs into the lungs every 6 (six) hours as needed. 09/09/17   Orbie Pyo, MD  atorvastatin (LIPITOR) 40 MG tablet Take 40 mg by mouth daily. In am. 01/23/17   [provider]  buPROPion (WELLBUTRIN SR) 100 MG 12 hr tablet Take 100 mg by mouth daily. In am    [provider]  cefdinir (OMNICEF) 300 MG capsule  09/20/17   [provider]  cetirizine (ZYRTEC) 10 MG tablet Take 10 mg by mouth daily. In am    [provider]  citalopram (CELEXA) 20 MG tablet Take 20 mg by mouth daily. In am. 12/29/15   [provider]  cyclobenzaprine (FLEXERIL) 10 MG tablet  10/25/17   [provider]  dicyclomine (BENTYL) 20 MG tablet Take 1 tablet (20 mg total) by mouth 3 (three) times daily as needed for spasms. Patient taking differently: Take 20 mg by mouth 3 (three) times daily as needed (abdominal pain).  09/09/17 09/09/18  Orbie Pyo, MD  Fluticasone Furoate (ARNUITY ELLIPTA) 200 MCG/ACT AEPB Inhale 1 Dose into the lungs daily. Patient not taking: Reported on 10/29/2017 08/03/16   Jiles Prows, MD  ibuprofen (ADVIL,MOTRIN) 600 MG tablet  10/25/17   [provider]  ibuprofen (ADVIL,MOTRIN) 800 MG tablet  07/20/17   [provider]  losartan-hydrochlorothiazide (HYZAAR) 50-12.5 MG tablet Take one tablet once daily Patient taking differently: Take 1 tablet by mouth every morning. Take one tablet once daily 11/16/16   Kozlow, Donnamarie Poag, MD  montelukast (SINGULAIR) 10 MG tablet TAKE ONE TABLET BY MOUTH AT BEDTIME 02/15/17   Kozlow, Donnamarie Poag, MD  ondansetron (ZOFRAN) 4 MG tablet Take 1 tablet (4 mg total) by mouth daily as needed. Patient not taking: Reported on 10/29/2017 09/09/17   Orbie Pyo,  MD  pantoprazole (PROTONIX) 40 MG tablet TAKE ONE TABLET BY MOUTH ONCE DAILY 11/01/16   Kozlow, Donnamarie Poag, MD  potassium chloride 20 MEQ TBCR Take 20 mEq by mouth daily. Patient not taking: Reported on 10/29/2017 04/09/17   Arisha Gervais Bellow, MD  ranitidine (ZANTAC) 300 MG tablet Take 1 tablet (300 mg total) by mouth at bedtime. 08/03/16   Kozlow, Donnamarie Poag, MD  rizatriptan (MAXALT) 10 MG tablet Take 1 tablet by mouth daily as needed for migraine.  01/19/14   [provider]  SYMBICORT 160-4.5 MCG/ACT inhaler  07/16/17   [provider]  terconazole (TERAZOL 7) 0.4 % vaginal cream Place 1 applicator vaginally at bedtime. Patient not taking: Reported on 10/29/2017 08/67/61   Copland, Deirdre Evener, PA-C  topiramate (TOPAMAX) 100 MG tablet Take 100 mg by mouth 2 (two) times daily.  06/27/17   [provider]  traMADol (ULTRAM) 50 MG tablet Take 1 tablet (50 mg total) by mouth every 6 (six) hours as needed. 01/15/18 01/15/19  Faatima Tench,  Herbie Baltimore, MD  valACYclovir (VALTREX) 500 MG tablet Take 500 mg by mouth daily.  08/30/17   [provider]     Allergies Doxycycline hyclate; Levofloxacin; Morphine; Penicillins; Sulfa antibiotics; Sumatriptan; Clindamycin; Codeine; and Diclofenac potassium  Family History  Problem Relation Age of Onset  . Leukemia Mother 8  . Colon polyps Mother   . Lung cancer Father   . Leukemia Maternal Aunt   . Brain cancer Sister     Social History Social History   Tobacco Use  . Smoking status: Never Smoker  . Smokeless tobacco: Never Used  Substance Use Topics  . Alcohol use: No  . Drug use: No    Review of Systems  Constitutional: No fever/chills Eyes: No visual changes.  ENT: No sore throat. Cardiovascular: Denies chest pain. Respiratory: Denies shortness of breath. Gastrointestinal: As above Genitourinary: Negative for dysuria. Musculoskeletal: Negative for back pain. Skin: Negative for rash. Neurological: Negative for focal  weakness   ____________________________________________   PHYSICAL EXAM:  VITAL SIGNS: ED Triage Vitals  Enc Vitals Group     BP 01/15/18 0913 127/78     Pulse Rate 01/15/18 0913 76     Resp 01/15/18 0913 18     Temp 01/15/18 0913 98.6 F (37 C)     Temp Source 01/15/18 0913 Oral     SpO2 01/15/18 0913 96 %     Weight --      Height --      Head Circumference --      Peak Flow --      Pain Score 01/15/18 0918 7     Pain Loc --      Pain Edu? --      Excl. in [place]? --     Constitutional: Alert and oriented. No acute distress. Pleasant and interactive Eyes: Conjunctivae are normal.  PERRLA, EOMI Head: Atraumatic. Nose: No congestion/rhinnorhea. Mouth/Throat: Mucous membranes are moist.    Cardiovascular: Normal rate, regular rhythm. Grossly normal heart sounds.  Good peripheral circulation. Respiratory: Normal respiratory effort.  No retractions.  Gastrointestinal: Mild right upper quadrant tenderness palpation   No distention.  No CVA tenderness. Genitourinary: deferred Musculoskeletal: No lower extremity tenderness nor edema.  Warm and well perfused Neurologic:  Normal speech and language. No gross focal neurologic deficits are appreciated.  Skin:  Skin is warm, dry and intact. No rash noted. Psychiatric: Mood and affect are normal. Speech and behavior are normal.  ____________________________________________   LABS (all labs ordered are listed, but only abnormal results are displayed)  Labs Reviewed  COMPREHENSIVE METABOLIC PANEL - Abnormal; Notable for the following components:      Result Value   Glucose, Bld 110 (*)    Creatinine, Ser 1.10 (*)    Calcium 8.7 (*)    GFR calc non Af Amer 58 (*)    All other components within normal limits  CBC - Abnormal; Notable for the following components:   RDW 15.3 (*)    All other components within normal limits  URINALYSIS, COMPLETE (UACMP) WITH MICROSCOPIC - Abnormal; Notable for the following components:   Color,  Urine YELLOW (*)    APPearance CLOUDY (*)    Bacteria, UA RARE (*)    All other components within normal limits  LIPASE, BLOOD   ____________________________________________  EKG  None ____________________________________________  RADIOLOGY  CT head unremarkable CT abdomen pelvis no acute abnormality ____________________________________________   PROCEDURES  Procedure(s) performed: No  Procedures   Critical Care performed: No ____________________________________________  INITIAL IMPRESSION / ASSESSMENT AND PLAN / ED COURSE  Pertinent labs & imaging results that were available during my care of the patient were reviewed by me and considered in my medical decision making (see chart for details).  Patient presents with primarily right-sided abdominal pain.  Apparently she had similar pain in the past likely due to cholecystitis although when she had her gallbladder removed there were significant adhesions around her VP shunt which may have also contributed to her pain.  Given that this was several years ago adhesions may have redeveloped.  CT scans are reassuring.  Lab work is unremarkable.  Patient was given IV pain medication with significant improvement or pain.  Recommend outpatient follow-up closely with her PCP,   ____________________________________________   FINAL CLINICAL IMPRESSION(S) / ED DIAGNOSES  Final diagnoses:  Right upper quadrant abdominal pain        Note:  This document was prepared using Dragon voice recognition software and may include unintentional dictation errors.    Lavonia Drafts, MD 01/15/18 1450

## 2018-01-24 DIAGNOSIS — R1011 Right upper quadrant pain: Secondary | ICD-10-CM | POA: Diagnosis not present

## 2018-01-29 ENCOUNTER — Encounter: Payer: Self-pay | Admitting: Gastroenterology

## 2018-01-29 ENCOUNTER — Other Ambulatory Visit: Payer: Self-pay

## 2018-01-29 ENCOUNTER — Ambulatory Visit (INDEPENDENT_AMBULATORY_CARE_PROVIDER_SITE_OTHER): Payer: PPO | Admitting: Gastroenterology

## 2018-01-29 VITALS — BP 122/83 | HR 84 | Temp 98.2°F | Ht 63.0 in | Wt 217.4 lb

## 2018-01-29 DIAGNOSIS — R1011 Right upper quadrant pain: Secondary | ICD-10-CM | POA: Diagnosis not present

## 2018-01-29 DIAGNOSIS — G8929 Other chronic pain: Secondary | ICD-10-CM

## 2018-01-29 DIAGNOSIS — K591 Functional diarrhea: Secondary | ICD-10-CM | POA: Diagnosis not present

## 2018-01-29 DIAGNOSIS — R1033 Periumbilical pain: Secondary | ICD-10-CM | POA: Diagnosis not present

## 2018-01-29 MED ORDER — CYCLOBENZAPRINE HCL 10 MG PO TABS: 10.0000 mg | ORAL_TABLET | Freq: Three times a day (TID) | ORAL | 2 refills | Status: DC

## 2018-01-29 NOTE — Progress Notes (Signed)
Gastroenterology Consultation  Referring Provider:     Algis Greenhouse, MD Primary Care Physician:  Algis Greenhouse, MD Primary Gastroenterologist:  Dr. Allen Norris     Reason for Consultation:     Right upper quadrant pain        HPI:   [name] is a 47 y.o. y/o female referred for consultation & management of right upper quadrant pain By Dr. Garlon Hatchet, Jaymes Graff, MD.  This patient comes in today after being seen in the emergency room for right upper quadrant pain.  The patient had stated that the pain was very similar to the pain she had prior to having her gallbladder out.  This patient comes in after being seen in the past by [facility] clinic gastroenterology back in 2015.  At that time the patient was being seen for nausea and vomiting.  The patient had a CT scan in the ER that showed her to have a right-sided nonobstructing kidney stone some atherosclerotic disease but no acute findings to explain the patient's right upper quadrant pain.  The patient's labs show the patient have a normal hemoglobin and hematocrit and no abnormal liver enzymes.  The patient reports that her abdominal pain usually starts first and then she starts to have diarrhea.  The patient also reports that the pain has woken her up in the middle the night and is sometimes worse with coughing and sneezing.  There is also increase in the abdominal pain when she eats just like when she had her gallbladder removed.  The patient has a peritoneal shunt.  The patient reports that she was seen in [place] by a Gastroenterologist in had an EGD and colonoscopy but was not happy with his bedside manner.  That is the reason the patient is now follow-up with me.  Past Medical History:  Diagnosis Date  . Anxiety   . Arthritis   . Asthma   . Depression   . GERD (gastroesophageal reflux disease)   . H. pylori infection 2015  . Herpes genitalia   . Hyperlipidemia   . Hypertension   . Lupus ([place])    Dr. Lucky Cowboy informed pt she does not  have lupus  . Seizures ([place])    last seizures 4-5 years ago.  . Subdural hematoma [facility]) July 2017   Bilateral    Past Surgical History:  Procedure Laterality Date  . ABDOMINAL HYSTERECTOMY  2008  . BRAIN SURGERY  02/2016   removal of 2 blood clotts from the brain.  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 BREAST EXCISIONAL BIOPSY Left 01/2014   lymph node  . BREAST SURGERY Left 02/04/14   left core bx identifying a fibroadenoma and excision of left axillary lymph node, benign  . BURR HOLE FOR SUBDURAL HEMATOMA  March 18, 2016  . CHOLECYSTECTOMY N/A 04/09/2017   Procedure: LAPAROSCOPIC CHOLECYSTECTOMY WITH INTRAOPERATIVE CHOLANGIOGRAM;  Surgeon: Robert Bellow, MD;  Location: [facility] ORS;  Service: General;  Laterality: N/A;  . COLONOSCOPY  12/21/2015  . COLONOSCOPY W/ BIOPSIES  12/08/2016   Tubular adenoma the sigmoid. No dysplasia. Benign colonic biopsies without evidence of colitis. [name], [name] [facility]  . FRACTURE SURGERY Right 2005   hand  . LAPAROSCOPIC REVISION VENTRICULAR-PERITONEAL (V-P) SHUNT  2000  . LEG SURGERY Left 2002  . NECK SURGERY  1985  . POSTERIOR LAMINECTOMY THORACIC AND LUMBAR SPINE Bilateral 1985   Scoliosis stabilization  . SHUNT REVISION  2003 & July 2017  .  SPINE SURGERY  1985   Scoliosis  . TUBAL LIGATION  1995  . UPPER GI ENDOSCOPY  12/08/2016   Hypertrophic gastric polyp, mild chronic gastritis. No evidence of H. pylori. [name], [name], [facility]    Prior to Admission medications   Medication Sig Start Date End Date Taking? Authorizing Provider  albuterol (PROVENTIL HFA;VENTOLIN HFA) 108 (90 Base) MCG/ACT inhaler Inhale 2 puffs into the lungs every 6 (six) hours as needed. 09/09/17   Orbie Pyo, MD  atorvastatin (LIPITOR) 40 MG tablet Take 40 mg by mouth daily. In am. 01/23/17   [provider]  buPROPion (WELLBUTRIN SR) 100 MG 12 hr tablet Take 100 mg by mouth daily. In am     [provider]  cefdinir (OMNICEF) 300 MG capsule  09/20/17   [provider]  cetirizine (ZYRTEC) 10 MG tablet Take 10 mg by mouth daily. In am    [provider]  citalopram (CELEXA) 20 MG tablet Take 20 mg by mouth daily. In am. 12/29/15   [provider]  cyclobenzaprine (FLEXERIL) 10 MG tablet  10/25/17   [provider]  dicyclomine (BENTYL) 20 MG tablet Take 1 tablet (20 mg total) by mouth 3 (three) times daily as needed for spasms. Patient taking differently: Take 20 mg by mouth 3 (three) times daily as needed (abdominal pain).  09/09/17 09/09/18  Orbie Pyo, MD  Fluticasone Furoate (ARNUITY ELLIPTA) 200 MCG/ACT AEPB Inhale 1 Dose into the lungs daily. Patient not taking: Reported on 10/29/2017 08/03/16   Jiles Prows, MD  ibuprofen (ADVIL,MOTRIN) 600 MG tablet  10/25/17   [provider]  ibuprofen (ADVIL,MOTRIN) 800 MG tablet  07/20/17   [provider]  losartan-hydrochlorothiazide (HYZAAR) 50-12.5 MG tablet Take one tablet once daily Patient taking differently: Take 1 tablet by mouth every morning. Take one tablet once daily 11/16/16   Kozlow, Donnamarie Poag, MD  montelukast (SINGULAIR) 10 MG tablet TAKE ONE TABLET BY MOUTH AT BEDTIME 02/15/17   Kozlow, Donnamarie Poag, MD  ondansetron (ZOFRAN) 4 MG tablet Take 1 tablet (4 mg total) by mouth daily as needed. Patient not taking: Reported on 10/29/2017 09/09/17   Orbie Pyo, MD  pantoprazole (PROTONIX) 40 MG tablet TAKE ONE TABLET BY MOUTH ONCE DAILY 11/01/16   Kozlow, Donnamarie Poag, MD  potassium chloride 20 MEQ TBCR Take 20 mEq by mouth daily. Patient not taking: Reported on 10/29/2017 04/09/17   Robert Bellow, MD  ranitidine (ZANTAC) 300 MG tablet Take 1 tablet (300 mg total) by mouth at bedtime. 08/03/16   Kozlow, Donnamarie Poag, MD  rizatriptan (MAXALT) 10 MG tablet Take 1 tablet by mouth daily as needed for migraine.  01/19/14   [provider]  SYMBICORT 160-4.5  MCG/ACT inhaler  07/16/17   [provider]  terconazole (TERAZOL 7) 0.4 % vaginal cream Place 1 applicator vaginally at bedtime. Patient not taking: Reported on 10/29/2017 05/69/79   Copland, Deirdre Evener, PA-C  topiramate (TOPAMAX) 100 MG tablet Take 100 mg by mouth 2 (two) times daily.  06/27/17   [provider]  traMADol (ULTRAM) 50 MG tablet Take 1 tablet (50 mg total) by mouth every 6 (six) hours as needed. 01/15/18 01/15/19  Lavonia Drafts, MD  valACYclovir (VALTREX) 500 MG tablet Take 500 mg by mouth daily.  08/30/17   [provider]    Family History  Problem Relation Age of Onset  . Leukemia Mother 36  . Colon polyps Mother   .  Lung cancer Father   . Leukemia Maternal Aunt   . Brain cancer Sister      Social History   Tobacco Use  . Smoking status: Never Smoker  . Smokeless tobacco: Never Used  Substance Use Topics  . Alcohol use: No  . Drug use: No    Allergies as of 01/29/2018 - Review Complete 01/15/2018  Allergen Reaction Noted  . Doxycycline hyclate Anaphylaxis 11/04/2015  . Levofloxacin Anaphylaxis 11/04/2015  . Morphine Hives, Itching, and Other (See Comments) 08/29/2015  . Penicillins Shortness Of Breath and Rash 10/27/2013  . Sulfa antibiotics Swelling, Nausea And Vomiting, and Other (See Comments) 10/27/2013  . Sumatriptan Rash 06/10/2014  . Clindamycin Diarrhea 11/04/2015  . Codeine Swelling 10/27/2013  . Diclofenac potassium  12/30/2015    Review of Systems:    All systems reviewed and negative except where noted in HPI.   Physical Exam:  There were no vitals taken for this visit. No LMP recorded. Patient has had a hysterectomy. Psych:  Alert and cooperative. Normal mood and affect. General:   Alert,  Well-developed, well-nourished, pleasant and cooperative in NAD Head:  Normocephalic and atraumatic. Eyes:  Sclera clear, no icterus.   Conjunctiva pink. Ears:  Normal auditory acuity. Nose:  No deformity, discharge, or  lesions. Mouth:  No deformity or lesions,oropharynx pink & moist. Neck:  Supple; no masses or thyromegaly. Lungs:  Respirations even and unlabored.  Clear throughout to auscultation.   No wheezes, crackles, or rhonchi. No acute distress. Heart:  Regular rate and rhythm; no murmurs, clicks, rubs, or gallops. Abdomen:  Normal bowel sounds.  No bruits.  Soft, Positive tenderness in the periumbilical area with right upper quadrant tenderness and non-distended without masses, hepatosplenomegaly.  There was a ventral hernias noted.  No guarding or rebound tenderness.  Negative Carnett sign.   Rectal:  Deferred.  Msk:  Symmetrical without gross deformities.  Good, equal movement & strength bilaterally. Pulses:  Normal pulses noted. Extremities:  No clubbing or edema.  No cyanosis. Neurologic:  Alert and oriented x3;  grossly normal neurologically. Skin:  Intact without significant lesions or rashes.  No jaundice. Lymph Nodes:  No significant cervical adenopathy. Psych:  Alert and cooperative. Normal mood and affect.  Imaging Studies: Ct Head Wo Contrast  Result Date: 01/15/2018 CLINICAL DATA:  Headaches for 1 week with generalized itching. EXAM: CT HEAD WITHOUT CONTRAST TECHNIQUE: Contiguous axial images were obtained from the base of the skull through the vertex without intravenous contrast. COMPARISON:  CT head 07/12/2017 FINDINGS: Brain: The right occipital ventriculostomy is unchanged within the right lateral ventricle. There is stable diffuse dilatation of the lateral and 3rd ventricles. 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 There is no evidence of acute intracranial hemorrhage, extra-axial fluid collection or periventricular edema. Vascular: No significant vascular findings. Skull: No acute findings.  Stable old craniotomy defects bilaterally Sinuses/Orbits: The visualized paranasal sinuses, mastoid air cells middle ears clear. No significant orbital  abnormalities. 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 Previous posterior cervical fusion and mild widening of the predental space are unchanged. IMPRESSION: 1. Stable examination without acute findings. 2. Stable ventriculomegaly status post shunting. 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 Electronically Signed   By: Richardean Sale [name]   On: 01/15/2018 13:13   Ct Abdomen Pelvis W Contrast  Result Date: 01/15/2018 CLINICAL DATA:  Abdominal pain EXAM: CT ABDOMEN AND PELVIS WITH CONTRAST TECHNIQUE: Multidetector CT imaging of the  abdomen and pelvis was performed using the standard protocol following bolus administration of intravenous contrast. Oral contrast was also administered. CONTRAST:  117mL ISOVUE-370 IOPAMIDOL (ISOVUE-370) INJECTION 76% COMPARISON:  June 28, 2017 FINDINGS: Lower chest: Lung bases are clear. There is mild posterior pleural thickening. Hepatobiliary: No focal liver lesions are apparent. Gallbladder is absent. There is no biliary duct dilatation. Pancreas: No pancreatic mass or inflammatory focus evident. Spleen: No splenic lesions are appreciable. Adrenals/Urinary Tract: Adrenals bilaterally appear normal. Kidneys bilaterally show no evident mass or hydronephrosis on either side. There is a 2 x 1 mm calculus in the anterior lower pole of the right kidney. There is no left-sided renal calculus. There is no ureteral calculus on either side. Urinary bladder is midline with wall thickness within normal limits. Stomach/Bowel: There is moderate stool throughout the colon. There is no appreciable bowel wall or mesenteric thickening. No evident bowel obstruction. No free air or portal venous air. Vascular/Lymphatic: There are foci of atherosclerotic calcification in the aorta. No aneurysm evident. Major mesenteric arterial vessels appear patent. There is no appreciable adenopathy in the abdomen or pelvis.  Reproductive: The uterus is absent. There are tubal ligation clips in the pelvis bilaterally. There is no pelvic mass. Other: There is no periappendiceal region inflammatory change. There is no abscess or ascites in the abdomen or pelvis. There is a minimal ventral hernia containing only fat. There is a ventriculoperitoneal shunt catheter. The catheter enters the abdomen in the right upper quadrant region slightly inferior to the liver and crosses to the left side. The tip of the catheter is in the lateral left upper pelvis, unchanged. There is no fluid in the area of the catheter. Musculoskeletal: There is rod fixation in the lower thoracic and upper lumbar regions with scoliosis evident in the upper lumbar region, convex to the right. There are no blastic or lytic bone lesions. There is no intramuscular or abdominal wall lesion evident. IMPRESSION: 1. No bowel wall thickening or bowel obstruction. No abscess. No periappendiceal region inflammation. 2. 2 x 1 mm nonobstructing calculus lower pole right kidney. No hydronephrosis on either side. No ureteral calculus on either side. 3. Ventriculoperitoneal shunt catheter tip in the lateral left upper pelvis. No fluid surrounding this catheter evident. 4.  Scoliosis with fixation rod in place. 5.  Gallbladder absent.  Uterus absent. 6.  Mild atherosclerotic calcification in the aorta. 7.  Small ventral hernia containing only fat. Aortic Atherosclerosis (ICD10-I70.0). Electronically Signed   By: Lowella Grip III [name]   On: 01/15/2018 13:11    Assessment and Plan:   AVALINA BENKO is a 47 y.o. y/o female with abdominal pain is not clearly musculoskeletal and not clearly gastrointestinal. The patient has been tried on dicyclomine in the past and is presently taking without any help.  The patient is also taking a PPI and Zantac that she states also does not help her symptoms.  The patient has been started on a trial of Flexeril to see if it helps her symptoms.   If the patient finds that it does not help her symptoms she has been told to take 2 Advil 3 times a day to see if that helps her pain.  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 The patient's diarrhea is likely due to her reaction to pain. The patient has been told that an ERCP has a risk  of pancreatitis pancreatic infection and even death.  The patient will try the plan outlined above and will contact me if her symptoms do not improve.  Lucilla Lame, MD. Mar[facility] Regal   Note: This dictation was prepared with Dragon dictation along with smaller phrase technology. Any transcriptional errors that result from this process are unintentional.

## 2018-02-04 DIAGNOSIS — I208 Other forms of angina pectoris: Secondary | ICD-10-CM | POA: Diagnosis not present

## 2018-02-04 DIAGNOSIS — G4719 Other hypersomnia: Secondary | ICD-10-CM | POA: Diagnosis not present

## 2018-02-04 DIAGNOSIS — G4733 Obstructive sleep apnea (adult) (pediatric): Secondary | ICD-10-CM | POA: Diagnosis not present

## 2018-02-04 DIAGNOSIS — E782 Mixed hyperlipidemia: Secondary | ICD-10-CM | POA: Diagnosis not present

## 2018-02-04 DIAGNOSIS — I10 Essential (primary) hypertension: Secondary | ICD-10-CM | POA: Diagnosis not present

## 2018-02-04 DIAGNOSIS — R569 Unspecified convulsions: Secondary | ICD-10-CM | POA: Diagnosis not present

## 2018-02-04 DIAGNOSIS — R51 Headache: Secondary | ICD-10-CM | POA: Diagnosis not present

## 2018-02-04 DIAGNOSIS — R0602 Shortness of breath: Secondary | ICD-10-CM | POA: Diagnosis not present

## 2018-02-04 DIAGNOSIS — G919 Hydrocephalus, unspecified: Secondary | ICD-10-CM | POA: Diagnosis not present

## 2018-02-04 DIAGNOSIS — K219 Gastro-esophageal reflux disease without esophagitis: Secondary | ICD-10-CM | POA: Diagnosis not present

## 2018-02-12 DIAGNOSIS — R070 Pain in throat: Secondary | ICD-10-CM | POA: Diagnosis not present

## 2018-02-12 DIAGNOSIS — J358 Other chronic diseases of tonsils and adenoids: Secondary | ICD-10-CM | POA: Diagnosis not present

## 2018-02-12 DIAGNOSIS — M26622 Arthralgia of left temporomandibular joint: Secondary | ICD-10-CM | POA: Diagnosis not present

## 2018-02-22 ENCOUNTER — Encounter: Payer: Self-pay | Admitting: Oncology

## 2018-02-22 ENCOUNTER — Inpatient Hospital Stay: Payer: PPO | Attending: Oncology | Admitting: Oncology

## 2018-02-22 ENCOUNTER — Inpatient Hospital Stay: Payer: PPO

## 2018-02-22 VITALS — BP 132/92 | HR 94 | Temp 97.5°F | Resp 18 | Ht 63.0 in | Wt 220.0 lb

## 2018-02-22 DIAGNOSIS — E669 Obesity, unspecified: Secondary | ICD-10-CM

## 2018-02-22 DIAGNOSIS — R740 Nonspecific elevation of levels of transaminase and lactic acid dehydrogenase [LDH]: Secondary | ICD-10-CM | POA: Insufficient documentation

## 2018-02-22 DIAGNOSIS — R5383 Other fatigue: Secondary | ICD-10-CM | POA: Diagnosis not present

## 2018-02-22 DIAGNOSIS — Z806 Family history of leukemia: Secondary | ICD-10-CM

## 2018-02-22 DIAGNOSIS — E538 Deficiency of other specified B group vitamins: Secondary | ICD-10-CM | POA: Insufficient documentation

## 2018-02-22 DIAGNOSIS — E65 Localized adiposity: Secondary | ICD-10-CM | POA: Insufficient documentation

## 2018-02-22 DIAGNOSIS — R1084 Generalized abdominal pain: Secondary | ICD-10-CM

## 2018-02-22 DIAGNOSIS — R945 Abnormal results of liver function studies: Secondary | ICD-10-CM | POA: Diagnosis not present

## 2018-02-22 LAB — CBC WITH DIFFERENTIAL/PLATELET
BASOS ABS: 0 10*3/uL (ref 0–0.1)
Basophils Relative: 0 %
EOS PCT: 4 %
Eosinophils Absolute: 0.3 10*3/uL (ref 0–0.7)
HEMATOCRIT: 38.2 % (ref 35.0–47.0)
HEMOGLOBIN: 13.4 g/dL (ref 12.0–16.0)
LYMPHS PCT: 28 %
Lymphs Abs: 1.9 10*3/uL (ref 1.0–3.6)
MCH: 30.5 pg (ref 26.0–34.0)
MCHC: 35.2 g/dL (ref 32.0–36.0)
MCV: 86.8 fL (ref 80.0–100.0)
Monocytes Absolute: 0.3 10*3/uL (ref 0.2–0.9)
Monocytes Relative: 5 %
NEUTROS ABS: 4.3 10*3/uL (ref 1.4–6.5)
NEUTROS PCT: 63 %
PLATELETS: 234 10*3/uL (ref 150–440)
RBC: 4.4 MIL/uL (ref 3.80–5.20)
RDW: 14.4 % (ref 11.5–14.5)
WBC: 6.9 10*3/uL (ref 3.6–11.0)

## 2018-02-22 LAB — COMPREHENSIVE METABOLIC PANEL
ALK PHOS: 92 U/L (ref 38–126)
ALT: 104 U/L — AB (ref 14–54)
AST: 66 U/L — AB (ref 15–41)
Albumin: 4.1 g/dL (ref 3.5–5.0)
Anion gap: 9 (ref 5–15)
BUN: 9 mg/dL (ref 6–20)
CHLORIDE: 105 mmol/L (ref 101–111)
CO2: 26 mmol/L (ref 22–32)
CREATININE: 0.87 mg/dL (ref 0.44–1.00)
Calcium: 9.4 mg/dL (ref 8.9–10.3)
GFR calc Af Amer: >60 mL/min (ref >60)
Glucose, Bld: 110 mg/dL — ABNORMAL HIGH (ref 65–99)
Potassium: 3.9 mmol/L (ref 3.5–5.1)
Sodium: 140 mmol/L (ref 135–145)
Total Bilirubin: 0.8 mg/dL (ref 0.3–1.2)
Total Protein: 7.5 g/dL (ref 6.5–8.1)

## 2018-02-22 LAB — IRON AND TIBC
Iron: 87 ug/dL (ref 28–170)
Saturation Ratios: 27 % (ref 10.4–31.8)
TIBC: 327 ug/dL (ref 250–450)
UIBC: 240 ug/dL

## 2018-02-22 LAB — FOLATE: Folate: 6.4 ng/mL (ref >5.9)

## 2018-02-22 LAB — VITAMIN B12: Vitamin B-12: 137 pg/mL — ABNORMAL LOW (ref 180–914)

## 2018-02-22 LAB — FERRITIN: Ferritin: 62 ng/mL (ref 11–307)

## 2018-02-22 LAB — LACTATE DEHYDROGENASE: LDH: 214 U/L — AB (ref 98–192)

## 2018-02-25 ENCOUNTER — Encounter: Payer: Self-pay | Admitting: Oncology

## 2018-02-25 NOTE — Progress Notes (Signed)
Hematology/Oncology Consult note [facility] [phone] [phone]  Patient Care Team: Algis Greenhouse, MD as PCP - General (Family Medicine) Ammie Dalton, Okey Regal, MD (Unknown Physician Specialty) Bary Castilla Forest Gleason, MD (General Surgery) Dustin Flock, [place] (Physician Assistant) Algis Greenhouse, MD (Family Medicine)   Name of the patient: [name]  [id number]  [birth year]    Reason for referral- family h/o leukemia   Referring physician- Dr. Clayborn Bigness  Date of visit: 02/25/18   History of presenting illness-patient is a 47 year old female with history significant for hypertension hyperlipidemia chronic normal pressure hydrocephalus status post shunting.  She was seen by cardiology for tachycardia and palpitations. She mentioned at that time that leukemia runs in her family and therefore has been referred to [place]. She feels poorly. She has chronic fatigue, intermittent abdominal pain and diarrhea. She was aslo found to be ANA positive in the past and was treated for possible lupus. She has seen several different specialists including GI, pulmonary, cardiology an neurology. She had cbc and cmp in  April 2019 which was normal. CT chest done in feb 2019 for sob showed no acute pathology or malignancy. CT abdomen done in April 2019 also did not reveal any malignancy.  Family history significant for "leukemia" in her mother and mmaternal aunts. She does not know what type of leukemia. She is concerned she may have it given her ongoing symptoms. Denies any loss of appetite or unintentional weight loss. She hurts everywhere  ECOG PS- 1  Pain scale- 5   Review of systems- Review of Systems  Constitutional: Positive for malaise/fatigue. Negative for chills, fever and weight loss.  HENT: Negative for congestion, ear discharge and nosebleeds.   Eyes: Negative for blurred vision.  Respiratory: Positive for shortness of breath. Negative for cough,  hemoptysis, sputum production and wheezing.   Cardiovascular: Positive for palpitations. Negative for chest pain, orthopnea and claudication.  Gastrointestinal: Positive for abdominal pain and diarrhea. Negative for blood in stool, constipation, heartburn, melena, nausea and vomiting.  Genitourinary: Negative for dysuria, flank pain, frequency, hematuria and urgency.  Musculoskeletal: Negative for back pain, joint pain and myalgias.  Skin: Negative for rash.  Neurological: Negative for dizziness, tingling, focal weakness, seizures, weakness and headaches.  Endo/Heme/Allergies: Does not bruise/bleed easily.  Psychiatric/Behavioral: Negative for depression and suicidal ideas. The patient does not have insomnia.     Allergies  Allergen Reactions  . Doxycycline Hyclate Anaphylaxis    Patient reports her throat closes up. Patient reports her throat closes up.  . Levofloxacin Anaphylaxis  . Morphine Hives, Itching and Other (See Comments)    Urinary incontinence.  . Penicillins Shortness Of Breath and Rash    Has patient had a PCN reaction causing immediate rash, facial/tongue/throat swelling, SOB or lightheadedness with hypotension: Yes Has patient had a PCN reaction causing severe rash involving mucus membranes or skin necrosis: Unknown Has patient had a PCN reaction that required hospitalization: No Has patient had a PCN reaction occurring within the last 10 years: No If all of the above answers are "NO", then may proceed with Cephalosporin use.  Has patient had a PCN reaction causing immediate rash, facial/tongue/throat swelling, SOB or lightheadedness with hypotension: Yes Has patient had a PCN reaction causing severe rash involving mucus membranes or skin necrosis: Unknown Has patient had a PCN reaction that required hospitalization: No Has patient had a PCN reaction occurring within the last 10 years: No If all of the above answers are "NO", then may  proceed with Cephalosporin  use. Has patient had a PCN reaction causing immediate rash,   . Sulfa Antibiotics Swelling, Nausea And Vomiting and Other (See Comments)    Rash and throat swelling  . Tramadol Rash  . Sumatriptan Rash    diarrhea  . Clindamycin Diarrhea  . Codeine Swelling  . Diclofenac Potassium     Other reaction(s): GI Upset (intolerance)    Patient Active Problem List   Diagnosis Date Noted  . Acute pain of left knee 12/06/2017  . Thyroid function test abnormal 12/06/2017  . Chronic fatigue 11/13/2017  . Allergic state 10/29/2017  . Increased frequency of urination 09/03/2017  . Right lower quadrant abdominal pain 06/29/2017  . 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 Screening for breast cancer 11/16/2016  . Plantar fasciitis 11/07/2016  . Tightness of heel cord, left 11/07/2016  . Hyperlipidemia, mixed 05/28/2016  . Prediabetes 05/28/2016  . Epilepsy ([place]) 04/10/2016  . Chronic pain syndrome 01/11/2016  . Osteoarthritis of both knees 01/11/2016  . ANA positive 01/04/2016  . Asthma 01/04/2016  . Connective tissue disease ([place]) 01/04/2016  . Chronic joint pain 12/02/2015  . Allergic rhinitis 11/25/2015  . Internal derangement of right knee 11/25/2015  . Migraine without status migrainosus, not intractable 11/25/2015  . Generalized anxiety disorder 10/27/2015  . Recurrent major depressive disorder, in remission ([place]) 10/27/2015  . Benign hypertension 10/25/2015  . Personal history of healed traumatic fracture 09/15/2015  . Acute sinusitis, unspecified 08/29/2015  . 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 Hydrocephalus 08/29/2015  . 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 Generalized abdominal pain 08/28/2015  . Weight gain 08/28/2015  . Vitamin D deficiency 06/29/2015  . Rectal bleeding 11/17/2014  . Convulsions ([place]) 03/05/2014  . Dizziness 03/05/2014  . Fibroadenoma of left breast 02/12/2014  . Mastalgia 01/27/2014     Past Medical History:  Diagnosis Date  . Anxiety   .  Arthritis   . Asthma   . Depression   . GERD (gastroesophageal reflux disease)   . H. pylori infection 2015  . Herpes genitalia   . Hyperlipidemia   . Hypertension   . Lupus ([place])    Dr. Lucky Cowboy informed pt she does not have lupus  . Seizures ([place])    last seizures 4-5 years ago.  . Subdural hematoma [facility]) July 2017   Bilateral     Past Surgical History:  Procedure Laterality Date  . ABDOMINAL HYSTERECTOMY  2008  . BRAIN SURGERY  02/2016   removal of 2 blood clotts from the brain.  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 BREAST EXCISIONAL BIOPSY Left 01/2014   lymph node  . BREAST SURGERY Left 02/04/14   left core bx identifying a fibroadenoma and excision of left axillary lymph node, benign  . BURR HOLE FOR SUBDURAL HEMATOMA  March 18, 2016  . CHOLECYSTECTOMY N/A 04/09/2017   Procedure: LAPAROSCOPIC CHOLECYSTECTOMY WITH INTRAOPERATIVE CHOLANGIOGRAM;  Surgeon: Robert Bellow, MD;  Location: [facility] ORS;  Service: General;  Laterality: N/A;  . COLONOSCOPY  12/21/2015  . COLONOSCOPY W/ BIOPSIES  12/08/2016   Tubular adenoma the sigmoid. No dysplasia. Benign colonic biopsies without evidence of colitis. [name], [name] [facility]  . FRACTURE SURGERY Right 2005   hand  . LAPAROSCOPIC REVISION VENTRICULAR-PERITONEAL (V-P) SHUNT  2000  . LEG SURGERY Left 2002  . NECK SURGERY  1985  . POSTERIOR LAMINECTOMY THORACIC AND LUMBAR SPINE Bilateral 1985   Scoliosis stabilization  . SHUNT REVISION  2003 &  July 2017  . SPINE SURGERY  1985   Scoliosis  . TUBAL LIGATION  1995  . UPPER GI ENDOSCOPY  12/08/2016   Hypertrophic gastric polyp, mild chronic gastritis. No evidence of H. pylori. [name], [name], [facility]    Social History   Socioeconomic History  . Marital status: Married    Spouse name: Not on file  . Number of children: Not on file  . Years of education: Not on file  . Highest education level: Not on file  Occupational  History  . Not on file  Social Needs  . Financial resource strain: Not on file  . Food insecurity:    Worry: Not on file    Inability: Not on file  . Transportation needs:    Medical: Not on file    Non-medical: Not on file  Tobacco Use  . Smoking status: Never Smoker  . Smokeless tobacco: Never Used  Substance and Sexual Activity  . Alcohol use: No  . Drug use: No  . Sexual activity: Not Currently  Lifestyle  . Physical activity:    Days per week: 5 days    Minutes per session: 30 min  . Stress: Not at all  Relationships  . Social connections:    Talks on phone: Not on file    Gets together: Not on file    Attends religious service: Not on file    Active member of club or organization: Not on file    Attends meetings of clubs or organizations: Not on file    Relationship status: Not on file  . Intimate partner violence:    Fear of current or ex partner: Not on file    Emotionally abused: Not on file    Physically abused: Not on file    Forced sexual activity: Not on file  Other Topics Concern  . Not on file  Social History Narrative  . Not on file     Family History  Problem Relation Age of Onset  . Leukemia Mother 1  . Colon polyps Mother   . Lung cancer Father   . Leukemia Maternal Aunt   . Brain cancer Sister      Current Outpatient Medications:  .  atorvastatin (LIPITOR) 40 MG tablet, Take 40 mg by mouth daily. In am., Disp: , Rfl:  .  buPROPion (WELLBUTRIN SR) 100 MG 12 hr tablet, Take 100 mg by mouth daily. In am, Disp: , Rfl:  .  cetirizine (ZYRTEC) 10 MG tablet, Take 10 mg by mouth daily. In am, Disp: , Rfl:  .  citalopram (CELEXA) 20 MG tablet, Take 20 mg by mouth daily. In am., Disp: , Rfl:  .  fluticasone (FLONASE) 50 MCG/ACT nasal spray, 1 spray by Each Nare route daily., Disp: , Rfl:  .  losartan-hydrochlorothiazide (HYZAAR) 50-12.5 MG tablet, Take one tablet once daily (Patient taking differently: Take 1 tablet by mouth every morning. Take one  tablet once daily), Disp: 90 tablet, Rfl: 0 .  montelukast (SINGULAIR) 10 MG tablet, TAKE ONE TABLET BY MOUTH AT BEDTIME, Disp: 30 tablet, Rfl: 0 .  pantoprazole (PROTONIX) 40 MG tablet, TAKE ONE TABLET BY MOUTH ONCE DAILY, Disp: 30 tablet, Rfl: 0 .  ranitidine (ZANTAC) 300 MG tablet, Take 1 tablet (300 mg total) by mouth at bedtime., Disp: 30 tablet, Rfl: 5 .  SYMBICORT 160-4.5 MCG/ACT inhaler, , Disp: , Rfl:  .  topiramate (TOPAMAX) 100 MG tablet, Take 100 mg by mouth 2 (two) times daily. ,  Disp: , Rfl:  .  valACYclovir (VALTREX) 500 MG tablet, Take 500 mg by mouth daily. , Disp: , Rfl:  .  albuterol (PROVENTIL HFA;VENTOLIN HFA) 108 (90 Base) MCG/ACT inhaler, Inhale 2 puffs into the lungs every 6 (six) hours as needed. (Patient not taking: Reported on 02/22/2018), Disp: 1 Inhaler, Rfl: 0 .  budesonide (PULMICORT) 0.5 MG/2ML nebulizer solution, Inhale into the lungs., Disp: , Rfl:  .  cefdinir (OMNICEF) 300 MG capsule, , Disp: , Rfl:  .  cyclobenzaprine (FLEXERIL) 10 MG tablet, Take 1 tablet (10 mg total) by mouth 3 (three) times daily. (Patient not taking: Reported on 02/22/2018), Disp: 60 tablet, Rfl: 2 .  dicyclomine (BENTYL) 20 MG tablet, Take 1 tablet (20 mg total) by mouth 3 (three) times daily as needed for spasms. (Patient not taking: Reported on 02/22/2018), Disp: 30 tablet, Rfl: 0 .  Fluticasone Furoate (ARNUITY ELLIPTA) 200 MCG/ACT AEPB, Inhale 1 Dose into the lungs daily. (Patient not taking: Reported on 10/29/2017), Disp: 30 each, Rfl: 5 .  ibuprofen (ADVIL,MOTRIN) 600 MG tablet, , Disp: , Rfl:  .  ibuprofen (ADVIL,MOTRIN) 800 MG tablet, , Disp: , Rfl:  .  ipratropium-albuterol (DUONEB) 0.5-2.5 (3) MG/3ML SOLN, Inhale into the lungs., Disp: , Rfl:  .  ondansetron (ZOFRAN) 4 MG tablet, Take 1 tablet (4 mg total) by mouth daily as needed. (Patient not taking: Reported on 02/22/2018), Disp: 10 tablet, Rfl: 0 .  potassium chloride 20 MEQ TBCR, Take 20 mEq by mouth daily. (Patient not taking:  Reported on 10/29/2017), Disp: 30 tablet, Rfl: 0 .  rizatriptan (MAXALT) 10 MG tablet, Take 1 tablet by mouth daily as needed for migraine. , Disp: , Rfl:  .  terconazole (TERAZOL 7) 0.4 % vaginal cream, Place 1 applicator vaginally at bedtime. (Patient not taking: Reported on 10/29/2017), Disp: 45 g, Rfl: 0 .  traMADol (ULTRAM) 50 MG tablet, Take 1 tablet (50 mg total) by mouth every 6 (six) hours as needed. (Patient not taking: Reported on 01/29/2018), Disp: 20 tablet, Rfl: 0   Physical exam:  Vitals:   02/22/18 1116  BP: (!) 132/92  Pulse: 94  Resp: 18  Temp: (!) 97.5 F (36.4 C)  TempSrc: Tympanic  SpO2: 96%  Weight: 220 lb (99.8 kg)  Height: 5\' 3"  (1.6 m)   Physical Exam  Constitutional: She is oriented to person, place, and time.  Patient is obese and anxious appearing  HENT:  Head: Normocephalic and atraumatic.  Eyes: Pupils are equal, round, and reactive to light. EOM are normal.  Neck: Normal range of motion.  Cardiovascular: Normal rate, regular rhythm and normal heart sounds.  Pulmonary/Chest: Effort normal and breath sounds normal.  Abdominal: Soft. Bowel sounds are normal.  Lymphadenopathy:  No palpable cervical, supraclavicular, axillary or inguinal adenopathy. B/l supraclavicular fat pad noted    Neurological: She is alert and oriented to person, place, and time.  Skin: Skin is warm and dry.       CMP Latest Ref Rng & Units 02/22/2018  Glucose 65 - 99 mg/dL 110(H)  BUN 6 - 20 mg/dL 9  Creatinine 0.44 - 1.00 mg/dL 0.87  Sodium 135 - 145 mmol/L 140  Potassium 3.5 - 5.1 mmol/L 3.9  Chloride 101 - 111 mmol/L 105  CO2 22 - 32 mmol/L 26  Calcium 8.9 - 10.3 mg/dL 9.4  Total Protein 6.5 - 8.1 g/dL 7.5  Total Bilirubin 0.3 - 1.2 mg/dL 0.8  Alkaline Phos 38 - 126 U/L 92  AST 15 -  41 U/L 66(H)  ALT 14 - 54 U/L 104(H)   CBC Latest Ref Rng & Units 02/22/2018  WBC 3.6 - 11.0 K/uL 6.9  Hemoglobin 12.0 - 16.0 g/dL 13.4  Hematocrit 35.0 - 47.0 % 38.2  Platelets 150 -  440 K/uL 234     Assessment and plan- Patient is a 47 y.o. female referred for fatigue and family h/o leukemia  Patients recent cbc/ cmp was normal. Her wbc platelet count was normal. No anemia. Recent CT chest abdomen pelvis showed no malignancy. Based on clinical symptoms and labs and recent imaging I see no conclusive evidence of malignancy. Despite family h/o leukemia (unsure if acute or chronic)- patient unlikely to have leukemia when her cbc is normal and stable over last 4 years. No adenopathy noted on scans  She appears older than stated age. She is obese and given the b/l supraclavicular fat pad notted, it may be worthwhile to rule out cushings disease and I will inform her pcp to consider work up for this.  From ym stand point, I will check cbc, cmp, iron studies, b12 and folate as well as LDH. I will see her back in 10 days time to discuss these tests   Thank you for this kind referral and the opportunity to participate in the care of this patient   Visit Diagnosis 1. Generalized abdominal pain   2. Fatigue, unspecified type     Dr. Randa Evens, MD, MPH [facility] [phone] 02/25/2018 10:07 AM

## 2018-03-05 ENCOUNTER — Inpatient Hospital Stay: Payer: PPO

## 2018-03-05 ENCOUNTER — Inpatient Hospital Stay (HOSPITAL_BASED_OUTPATIENT_CLINIC_OR_DEPARTMENT_OTHER): Payer: PPO | Admitting: Oncology

## 2018-03-05 ENCOUNTER — Encounter: Payer: Self-pay | Admitting: Oncology

## 2018-03-05 VITALS — BP 157/97 | HR 94 | Temp 97.8°F | Resp 18 | Ht 63.0 in | Wt 223.0 lb

## 2018-03-05 DIAGNOSIS — E538 Deficiency of other specified B group vitamins: Secondary | ICD-10-CM

## 2018-03-05 DIAGNOSIS — R945 Abnormal results of liver function studies: Secondary | ICD-10-CM

## 2018-03-05 DIAGNOSIS — R5383 Other fatigue: Secondary | ICD-10-CM | POA: Diagnosis not present

## 2018-03-05 DIAGNOSIS — E669 Obesity, unspecified: Secondary | ICD-10-CM | POA: Diagnosis not present

## 2018-03-05 DIAGNOSIS — Z806 Family history of leukemia: Secondary | ICD-10-CM

## 2018-03-05 DIAGNOSIS — E65 Localized adiposity: Secondary | ICD-10-CM | POA: Diagnosis not present

## 2018-03-05 DIAGNOSIS — R740 Nonspecific elevation of levels of transaminase and lactic acid dehydrogenase [LDH]: Secondary | ICD-10-CM

## 2018-03-05 DIAGNOSIS — R748 Abnormal levels of other serum enzymes: Secondary | ICD-10-CM

## 2018-03-05 HISTORY — DX: Deficiency of other specified B group vitamins: E53.8

## 2018-03-05 MED ORDER — CYANOCOBALAMIN 1000 MCG/ML IJ SOLN
1000.0000 ug | Freq: Once | INTRAMUSCULAR | Status: AC
  Administered 2018-03-05: 1000 ug via INTRAMUSCULAR
  Filled 2018-03-05: qty 1

## 2018-03-05 NOTE — Progress Notes (Signed)
Hematology/Oncology Consult note [facility]  [phone] [phone]  Patient Care Team: Algis Greenhouse, MD as PCP - General (Family Medicine) Ammie Dalton, Okey Regal, MD (Unknown Physician Specialty) Bary Castilla Forest Gleason, MD (General Surgery) Dustin Flock, [place] (Physician Assistant) Algis Greenhouse, MD (Family Medicine)   Name of the patient: [name]  [id number]  Oct 22, 1970   Date of visit: 03/05/18  Diagnosis- b12 deficiency  Chief complaint/ Reason for visit- discuss results of bloodwork   Heme/Onc history: patient is a 47 year old female with history significant for hypertension hyperlipidemia chronic normal pressure hydrocephalus status post shunting.  She was seen by cardiology for tachycardia and palpitations. She mentioned at that time that leukemia runs in her family and therefore has been referred to [place]. She feels poorly. She has chronic fatigue, intermittent abdominal pain and diarrhea. She was aslo found to be ANA positive in the past and was treated for possible lupus. She has seen several different specialists including GI, pulmonary, cardiology an neurology. She had cbc and cmp in  April 2019 which was normal. CT chest done in feb 2019 for sob showed no acute pathology or malignancy. CT abdomen done in April 2019 also did not reveal any malignancy.  Family history significant for "leukemia" in her mother and maternal aunts. She does not know what type of leukemia. She is concerned she may have it given her ongoing symptoms. Denies any loss of appetite or unintentional weight loss. She hurts everywhere  Interval history- she continues to have multiple symptoms. Reports fatigue, neck pain. Has RUQ abdominal pain and intermittent diarrhea. hasgeneralized bodyaches  ECOG PS- 1 Pain scale- 8   Review of systems- Review of Systems  Constitutional: Positive for malaise/fatigue. Negative for chills, fever and weight loss.    Bodyaches  HENT: Negative for congestion, ear discharge and nosebleeds.   Eyes: Negative for blurred vision.  Respiratory: Negative for cough, hemoptysis, sputum production, shortness of breath and wheezing.   Cardiovascular: Negative for chest pain, palpitations, orthopnea and claudication.  Gastrointestinal: Positive for abdominal pain. Negative for blood in stool, constipation, diarrhea, heartburn, melena, nausea and vomiting.  Genitourinary: Negative for dysuria, flank pain, frequency, hematuria and urgency.  Musculoskeletal: Positive for back pain. Negative for joint pain and myalgias.  Skin: Negative for rash.  Neurological: Negative for dizziness, tingling, focal weakness, seizures, weakness and headaches.  Endo/Heme/Allergies: Does not bruise/bleed easily.  Psychiatric/Behavioral: Negative for depression and suicidal ideas. The patient does not have insomnia.       Allergies  Allergen Reactions  . Doxycycline Hyclate Anaphylaxis    Patient reports her throat closes up. Patient reports her throat closes up.  . Levofloxacin Anaphylaxis  . Morphine Hives, Itching and Other (See Comments)    Urinary incontinence.  . Penicillins Shortness Of Breath and Rash    Has patient had a PCN reaction causing immediate rash, facial/tongue/throat swelling, SOB or lightheadedness with hypotension: Yes Has patient had a PCN reaction causing severe rash involving mucus membranes or skin necrosis: Unknown Has patient had a PCN reaction that required hospitalization: No Has patient had a PCN reaction occurring within the last 10 years: No If all of the above answers are "NO", then may proceed with Cephalosporin use.  Has patient had a PCN reaction causing immediate rash, facial/tongue/throat swelling, SOB or lightheadedness with hypotension: Yes Has patient had a PCN reaction causing severe rash involving mucus membranes or skin necrosis: Unknown Has patient had a PCN reaction that required  hospitalization: No Has patient had a PCN reaction occurring within the last 10 years: No If all of the above answers are "NO", then may proceed with Cephalosporin use. Has patient had a PCN reaction causing immediate rash,   . Sulfa Antibiotics Swelling, Nausea And Vomiting and Other (See Comments)    Rash and throat swelling  . Tramadol Rash  . Sumatriptan Rash    diarrhea  . Clindamycin Diarrhea  . Codeine Swelling  . Diclofenac Potassium     Other reaction(s): GI Upset (intolerance)     Past Medical History:  Diagnosis Date  . Anxiety   . Arthritis   . Asthma   . Depression   . GERD (gastroesophageal reflux disease)   . H. pylori infection 2015  . Herpes genitalia   . Hyperlipidemia   . Hypertension   . Lupus ([place])    Dr. Lucky Cowboy informed pt she does not have lupus  . Seizures ([place])    last seizures 4-5 years ago.  . Subdural hematoma [facility]) July 2017   Bilateral     Past Surgical History:  Procedure Laterality Date  . ABDOMINAL HYSTERECTOMY  2008  . BRAIN SURGERY  02/2016   removal of 2 blood clotts from the brain.  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 BREAST EXCISIONAL BIOPSY Left 01/2014   lymph node  . BREAST SURGERY Left 02/04/14   left core bx identifying a fibroadenoma and excision of left axillary lymph node, benign  . BURR HOLE FOR SUBDURAL HEMATOMA  March 18, 2016  . CHOLECYSTECTOMY N/A 04/09/2017   Procedure: LAPAROSCOPIC CHOLECYSTECTOMY WITH INTRAOPERATIVE CHOLANGIOGRAM;  Surgeon: Robert Bellow, MD;  Location: [facility] ORS;  Service: General;  Laterality: N/A;  . COLONOSCOPY  12/21/2015  . COLONOSCOPY W/ BIOPSIES  12/08/2016   Tubular adenoma the sigmoid. No dysplasia. Benign colonic biopsies without evidence of colitis. [name], [name] [facility]  . FRACTURE SURGERY Right 2005   hand  . LAPAROSCOPIC REVISION VENTRICULAR-PERITONEAL (V-P) SHUNT  2000  . LEG SURGERY Left 2002  . NECK SURGERY  1985  . POSTERIOR  LAMINECTOMY THORACIC AND LUMBAR SPINE Bilateral 1985   Scoliosis stabilization  . SHUNT REVISION  2003 & July 2017  . SPINE SURGERY  1985   Scoliosis  . TUBAL LIGATION  1995  . UPPER GI ENDOSCOPY  12/08/2016   Hypertrophic gastric polyp, mild chronic gastritis. No evidence of H. pylori. [name], [name], [facility]    Social History   Socioeconomic History  . Marital status: Married    Spouse name: Not on file  . Number of children: Not on file  . Years of education: Not on file  . Highest education level: Not on file  Occupational History  . Not on file  Social Needs  . Financial resource strain: Not on file  . Food insecurity:    Worry: Not on file    Inability: Not on file  . Transportation needs:    Medical: Not on file    Non-medical: Not on file  Tobacco Use  . Smoking status: Never Smoker  . Smokeless tobacco: Never Used  Substance and Sexual Activity  . Alcohol use: No  . Drug use: No  . Sexual activity: Not Currently  Lifestyle  . Physical activity:    Days per week: 5 days    Minutes per session: 30 min  . Stress: Not at all  Relationships  . Social connections:    Talks on  phone: Not on file    Gets together: Not on file    Attends religious service: Not on file    Active member of club or organization: Not on file    Attends meetings of clubs or organizations: Not on file    Relationship status: Not on file  . Intimate partner violence:    Fear of current or ex partner: Not on file    Emotionally abused: Not on file    Physically abused: Not on file    Forced sexual activity: Not on file  Other Topics Concern  . Not on file  Social History Narrative  . Not on file    Family History  Problem Relation Age of Onset  . Leukemia Mother 46  . Colon polyps Mother   . Lung cancer Father   . Leukemia Maternal Aunt   . Brain cancer Sister      Current Outpatient Medications:  .  atorvastatin (LIPITOR) 40 MG tablet, Take 40 mg  by mouth daily. In am., Disp: , Rfl:  .  budesonide (PULMICORT) 0.5 MG/2ML nebulizer solution, Inhale into the lungs., Disp: , Rfl:  .  buPROPion (WELLBUTRIN SR) 100 MG 12 hr tablet, Take 100 mg by mouth daily. In am, Disp: , Rfl:  .  cefdinir (OMNICEF) 300 MG capsule, , Disp: , Rfl:  .  cetirizine (ZYRTEC) 10 MG tablet, Take 10 mg by mouth daily. In am, Disp: , Rfl:  .  citalopram (CELEXA) 20 MG tablet, Take 20 mg by mouth daily. In am., Disp: , Rfl:  .  ipratropium-albuterol (DUONEB) 0.5-2.5 (3) MG/3ML SOLN, Inhale into the lungs., Disp: , Rfl:  .  losartan-hydrochlorothiazide (HYZAAR) 50-12.5 MG tablet, Take one tablet once daily (Patient taking differently: Take 1 tablet by mouth every morning. Take one tablet once daily), Disp: 90 tablet, Rfl: 0 .  montelukast (SINGULAIR) 10 MG tablet, TAKE ONE TABLET BY MOUTH AT BEDTIME, Disp: 30 tablet, Rfl: 0 .  pantoprazole (PROTONIX) 40 MG tablet, TAKE ONE TABLET BY MOUTH ONCE DAILY, Disp: 30 tablet, Rfl: 0 .  ranitidine (ZANTAC) 300 MG tablet, Take 1 tablet (300 mg total) by mouth at bedtime., Disp: 30 tablet, Rfl: 5 .  SYMBICORT 160-4.5 MCG/ACT inhaler, , Disp: , Rfl:  .  topiramate (TOPAMAX) 100 MG tablet, Take 100 mg by mouth 2 (two) times daily. , Disp: , Rfl:  .  valACYclovir (VALTREX) 500 MG tablet, Take 500 mg by mouth daily. , Disp: , Rfl:  .  albuterol (PROVENTIL HFA;VENTOLIN HFA) 108 (90 Base) MCG/ACT inhaler, Inhale 2 puffs into the lungs every 6 (six) hours as needed. (Patient not taking: Reported on 02/22/2018), Disp: 1 Inhaler, Rfl: 0 .  cyclobenzaprine (FLEXERIL) 10 MG tablet, Take 1 tablet (10 mg total) by mouth 3 (three) times daily. (Patient not taking: Reported on 02/22/2018), Disp: 60 tablet, Rfl: 2 .  dicyclomine (BENTYL) 20 MG tablet, Take 1 tablet (20 mg total) by mouth 3 (three) times daily as needed for spasms. (Patient not taking: Reported on 02/22/2018), Disp: 30 tablet, Rfl: 0 .  fluticasone (FLONASE) 50 MCG/ACT nasal spray, 1 spray  by Each Nare route daily., Disp: , Rfl:  .  Fluticasone Furoate (ARNUITY ELLIPTA) 200 MCG/ACT AEPB, Inhale 1 Dose into the lungs daily. (Patient not taking: Reported on 10/29/2017), Disp: 30 each, Rfl: 5 .  ibuprofen (ADVIL,MOTRIN) 600 MG tablet, , Disp: , Rfl:  .  ibuprofen (ADVIL,MOTRIN) 800 MG tablet, , Disp: , Rfl:  .  ondansetron (ZOFRAN)  4 MG tablet, Take 1 tablet (4 mg total) by mouth daily as needed. (Patient not taking: Reported on 02/22/2018), Disp: 10 tablet, Rfl: 0 .  potassium chloride 20 MEQ TBCR, Take 20 mEq by mouth daily. (Patient not taking: Reported on 10/29/2017), Disp: 30 tablet, Rfl: 0 .  rizatriptan (MAXALT) 10 MG tablet, Take 1 tablet by mouth daily as needed for migraine. , Disp: , Rfl:  .  terconazole (TERAZOL 7) 0.4 % vaginal cream, Place 1 applicator vaginally at bedtime. (Patient not taking: Reported on 10/29/2017), Disp: 45 g, Rfl: 0 .  traMADol (ULTRAM) 50 MG tablet, Take 1 tablet (50 mg total) by mouth every 6 (six) hours as needed. (Patient not taking: Reported on 01/29/2018), Disp: 20 tablet, Rfl: 0  Physical exam:  Vitals:   03/05/18 1048  BP: (!) 157/97  Pulse: 94  Resp: 18  Temp: 97.8 F (36.6 C)  TempSrc: Tympanic  SpO2: 95%  Weight: 223 lb (101.2 kg)  Height: 5\' 3"  (1.6 m)   Physical Exam  Constitutional: She is oriented to person, place, and time.  Patient is obese. Appears fatigued. B/l supraclavicular fat pad  HENT:  Head: Normocephalic and atraumatic.  Eyes: Pupils are equal, round, and reactive to light. EOM are normal.  Neck: Normal range of motion.  Cardiovascular: Normal rate, regular rhythm and normal heart sounds.  Pulmonary/Chest: Effort normal and breath sounds normal.  Abdominal: Soft. Bowel sounds are normal.  Neurological: She is alert and oriented to person, place, and time.  Skin: Skin is warm and dry.     CMP Latest Ref Rng & Units 02/22/2018  Glucose 65 - 99 mg/dL 110(H)  BUN 6 - 20 mg/dL 9  Creatinine 0.44 - 1.00 mg/dL 0.87    Sodium 135 - 145 mmol/L 140  Potassium 3.5 - 5.1 mmol/L 3.9  Chloride 101 - 111 mmol/L 105  CO2 22 - 32 mmol/L 26  Calcium 8.9 - 10.3 mg/dL 9.4  Total Protein 6.5 - 8.1 g/dL 7.5  Total Bilirubin 0.3 - 1.2 mg/dL 0.8  Alkaline Phos 38 - 126 U/L 92  AST 15 - 41 U/L 66(H)  ALT 14 - 54 U/L 104(H)   CBC Latest Ref Rng & Units 02/22/2018  WBC 3.6 - 11.0 K/uL 6.9  Hemoglobin 12.0 - 16.0 g/dL 13.4  Hematocrit 35.0 - 47.0 % 38.2  Platelets 150 - 440 K/uL 234     Assessment and plan- Patient is a 47 y.o. female with following issues:  1. Family h/o lymphoma and multiple somatic complaints. Cbc, cmp normal. Mildly elevated ldh is non specific and not suggestive of malignancy. She did have recent CT chest and abdomen which did not reveal any malignancy. No further oncology work up at this time. PCP should consider ruling out cushings disease given her fatigue and b/l supraclavicular fat pad noted on exam  2. Blood work did reveal evidence of B12 deficiency. We will proceed with weekly b12 X 4 followed by monthly. Patient prefers B12 shots over po tablets  3. Abnormal LFTs- repeat in 2 weeks. If still elevated, we will let her GI doctor know about it for follow up  I will see her back in 3 months with cbc and b12 levels   Visit Diagnosis 1. B12 deficiency   2. Elevated liver enzymes      Dr. Randa Evens, MD, MPH [facility] [phone] 03/05/2018 3:32 PM

## 2018-03-12 ENCOUNTER — Inpatient Hospital Stay: Payer: PPO

## 2018-03-12 DIAGNOSIS — E538 Deficiency of other specified B group vitamins: Secondary | ICD-10-CM

## 2018-03-12 DIAGNOSIS — R5383 Other fatigue: Secondary | ICD-10-CM | POA: Diagnosis not present

## 2018-03-12 MED ORDER — CYANOCOBALAMIN 1000 MCG/ML IJ SOLN
1000.0000 ug | INTRAMUSCULAR | Status: AC
  Administered 2018-03-12: 1000 ug via INTRAMUSCULAR

## 2018-03-19 ENCOUNTER — Other Ambulatory Visit: Payer: Self-pay

## 2018-03-19 ENCOUNTER — Inpatient Hospital Stay: Payer: PPO

## 2018-03-19 ENCOUNTER — Inpatient Hospital Stay: Payer: PPO | Attending: Oncology

## 2018-03-19 DIAGNOSIS — R748 Abnormal levels of other serum enzymes: Secondary | ICD-10-CM

## 2018-03-19 DIAGNOSIS — E538 Deficiency of other specified B group vitamins: Secondary | ICD-10-CM | POA: Insufficient documentation

## 2018-03-19 LAB — COMPREHENSIVE METABOLIC PANEL
ALK PHOS: 78 U/L (ref 38–126)
ALT: 21 U/L (ref 0–44)
ANION GAP: 9 (ref 5–15)
AST: 20 U/L (ref 15–41)
Albumin: 4.1 g/dL (ref 3.5–5.0)
BUN: 14 mg/dL (ref 6–20)
CALCIUM: 9.4 mg/dL (ref 8.9–10.3)
CO2: 25 mmol/L (ref 22–32)
Chloride: 109 mmol/L (ref 98–111)
Creatinine, Ser: 1.03 mg/dL — ABNORMAL HIGH (ref 0.44–1.00)
GFR calc Af Amer: >60 mL/min (ref >60)
Glucose, Bld: 104 mg/dL — ABNORMAL HIGH (ref 70–99)
Potassium: 3.7 mmol/L (ref 3.5–5.1)
SODIUM: 143 mmol/L (ref 135–145)
TOTAL PROTEIN: 7.6 g/dL (ref 6.5–8.1)
Total Bilirubin: 0.9 mg/dL (ref 0.3–1.2)

## 2018-03-20 ENCOUNTER — Encounter: Payer: Self-pay | Admitting: Oncology

## 2018-03-22 ENCOUNTER — Telehealth: Payer: Self-pay

## 2018-03-22 NOTE — Telephone Encounter (Signed)
Please call this patient. She needs b12 shots weekly X 2 followed by monthly. 2 weekly shots were given but nothing since then

## 2018-03-22 NOTE — Telephone Encounter (Signed)
Spoke with Ms. Howley to inform her/ per Dr.Rao the patient sent a message requesting her B-12 shots. b12 shots weekly X 2 followed by monthly. The patient was understanding and agreeable

## 2018-03-26 ENCOUNTER — Inpatient Hospital Stay: Payer: PPO

## 2018-03-26 DIAGNOSIS — E538 Deficiency of other specified B group vitamins: Secondary | ICD-10-CM | POA: Diagnosis not present

## 2018-03-26 MED ORDER — CYANOCOBALAMIN 1000 MCG/ML IJ SOLN
1000.0000 ug | INTRAMUSCULAR | Status: DC
  Administered 2018-03-26: 1000 ug via INTRAMUSCULAR

## 2018-04-02 ENCOUNTER — Inpatient Hospital Stay: Payer: PPO

## 2018-04-02 DIAGNOSIS — E538 Deficiency of other specified B group vitamins: Secondary | ICD-10-CM | POA: Diagnosis not present

## 2018-04-02 MED ORDER — CYANOCOBALAMIN 1000 MCG/ML IJ SOLN
1000.0000 ug | INTRAMUSCULAR | Status: DC
  Administered 2018-04-02: 1000 ug via INTRAMUSCULAR

## 2018-04-03 DIAGNOSIS — Z79899 Other long term (current) drug therapy: Secondary | ICD-10-CM | POA: Diagnosis not present

## 2018-04-03 DIAGNOSIS — Z882 Allergy status to sulfonamides status: Secondary | ICD-10-CM | POA: Diagnosis not present

## 2018-04-03 DIAGNOSIS — Z881 Allergy status to other antibiotic agents status: Secondary | ICD-10-CM | POA: Diagnosis not present

## 2018-04-03 DIAGNOSIS — R109 Unspecified abdominal pain: Secondary | ICD-10-CM | POA: Diagnosis not present

## 2018-04-03 DIAGNOSIS — Z885 Allergy status to narcotic agent status: Secondary | ICD-10-CM | POA: Diagnosis not present

## 2018-04-03 DIAGNOSIS — R51 Headache: Secondary | ICD-10-CM | POA: Diagnosis not present

## 2018-04-03 DIAGNOSIS — R1011 Right upper quadrant pain: Secondary | ICD-10-CM | POA: Diagnosis not present

## 2018-04-03 DIAGNOSIS — R6883 Chills (without fever): Secondary | ICD-10-CM | POA: Diagnosis not present

## 2018-04-03 DIAGNOSIS — I10 Essential (primary) hypertension: Secondary | ICD-10-CM | POA: Diagnosis not present

## 2018-04-03 DIAGNOSIS — Z888 Allergy status to other drugs, medicaments and biological substances status: Secondary | ICD-10-CM | POA: Diagnosis not present

## 2018-04-03 DIAGNOSIS — R110 Nausea: Secondary | ICD-10-CM | POA: Diagnosis not present

## 2018-04-03 DIAGNOSIS — Z880 Allergy status to penicillin: Secondary | ICD-10-CM | POA: Diagnosis not present

## 2018-04-07 DIAGNOSIS — N390 Urinary tract infection, site not specified: Secondary | ICD-10-CM | POA: Diagnosis not present

## 2018-04-07 DIAGNOSIS — J029 Acute pharyngitis, unspecified: Secondary | ICD-10-CM | POA: Diagnosis not present

## 2018-04-07 DIAGNOSIS — R9431 Abnormal electrocardiogram [ECG] [EKG]: Secondary | ICD-10-CM | POA: Diagnosis not present

## 2018-04-07 DIAGNOSIS — B9620 Unspecified Escherichia coli [E. coli] as the cause of diseases classified elsewhere: Secondary | ICD-10-CM | POA: Diagnosis not present

## 2018-04-07 DIAGNOSIS — R1084 Generalized abdominal pain: Secondary | ICD-10-CM | POA: Diagnosis not present

## 2018-04-07 DIAGNOSIS — R109 Unspecified abdominal pain: Secondary | ICD-10-CM | POA: Diagnosis not present

## 2018-04-07 DIAGNOSIS — M791 Myalgia, unspecified site: Secondary | ICD-10-CM | POA: Diagnosis not present

## 2018-04-07 DIAGNOSIS — R21 Rash and other nonspecific skin eruption: Secondary | ICD-10-CM | POA: Diagnosis not present

## 2018-04-07 DIAGNOSIS — R51 Headache: Secondary | ICD-10-CM | POA: Diagnosis not present

## 2018-04-07 DIAGNOSIS — R1110 Vomiting, unspecified: Secondary | ICD-10-CM | POA: Diagnosis not present

## 2018-04-08 DIAGNOSIS — R9431 Abnormal electrocardiogram [ECG] [EKG]: Secondary | ICD-10-CM | POA: Diagnosis not present

## 2018-04-21 ENCOUNTER — Encounter: Payer: Self-pay | Admitting: Oncology

## 2018-04-22 ENCOUNTER — Inpatient Hospital Stay: Payer: PPO | Attending: Oncology

## 2018-04-22 ENCOUNTER — Other Ambulatory Visit: Payer: Self-pay | Admitting: *Deleted

## 2018-04-22 ENCOUNTER — Other Ambulatory Visit: Payer: Self-pay

## 2018-04-22 DIAGNOSIS — Z806 Family history of leukemia: Secondary | ICD-10-CM | POA: Insufficient documentation

## 2018-04-22 DIAGNOSIS — E669 Obesity, unspecified: Secondary | ICD-10-CM | POA: Insufficient documentation

## 2018-04-22 DIAGNOSIS — E65 Localized adiposity: Secondary | ICD-10-CM | POA: Insufficient documentation

## 2018-04-22 DIAGNOSIS — R5383 Other fatigue: Secondary | ICD-10-CM

## 2018-04-22 DIAGNOSIS — E538 Deficiency of other specified B group vitamins: Secondary | ICD-10-CM

## 2018-04-22 DIAGNOSIS — R945 Abnormal results of liver function studies: Secondary | ICD-10-CM | POA: Insufficient documentation

## 2018-04-22 DIAGNOSIS — R740 Nonspecific elevation of levels of transaminase and lactic acid dehydrogenase [LDH]: Secondary | ICD-10-CM | POA: Insufficient documentation

## 2018-04-22 NOTE — Telephone Encounter (Signed)
This encounter was created in error - please disregard.

## 2018-04-22 NOTE — Patient Outreach (Signed)
[facility]) Care Management  04/22/2018  [name] 09/14/1971 [id number]    TELEPHONE SCREENING Referral date: 04/22/18 Referral source: Utilization management  Insurance: [employer]  Telephone call to patient regarding regarding utilization management referral. HIPAA verified with patient. Explained reason for call.  Discussed and offered [facility] care management services. Patient refused services at this time. Patient agreeable to having [facility] care management brochure mailed to her for future reference.   PLAN: RNCM will close patient due to refusal of services.  RNCm will send closure notification to patients primary MD  RNCM will send patient [facility] care management brochure/ magnet as discussed.  Quinn Plowman RN,BSN,CCM [facility] Telephonic  [phone]

## 2018-04-23 ENCOUNTER — Inpatient Hospital Stay: Payer: PPO

## 2018-04-23 ENCOUNTER — Other Ambulatory Visit: Payer: Self-pay | Admitting: *Deleted

## 2018-04-23 DIAGNOSIS — E538 Deficiency of other specified B group vitamins: Secondary | ICD-10-CM

## 2018-04-23 DIAGNOSIS — Z806 Family history of leukemia: Secondary | ICD-10-CM | POA: Diagnosis not present

## 2018-04-23 DIAGNOSIS — R5383 Other fatigue: Secondary | ICD-10-CM

## 2018-04-23 DIAGNOSIS — R945 Abnormal results of liver function studies: Secondary | ICD-10-CM | POA: Diagnosis not present

## 2018-04-23 DIAGNOSIS — R740 Nonspecific elevation of levels of transaminase and lactic acid dehydrogenase [LDH]: Secondary | ICD-10-CM | POA: Diagnosis not present

## 2018-04-23 DIAGNOSIS — E669 Obesity, unspecified: Secondary | ICD-10-CM | POA: Diagnosis not present

## 2018-04-23 DIAGNOSIS — E65 Localized adiposity: Secondary | ICD-10-CM | POA: Diagnosis not present

## 2018-04-23 LAB — CBC WITH DIFFERENTIAL/PLATELET
BASOS PCT: 1 %
Basophils Absolute: 0.1 10*3/uL (ref 0–0.1)
EOS ABS: 0.3 10*3/uL (ref 0–0.7)
Eosinophils Relative: 4 %
HEMATOCRIT: 37.3 % (ref 35.0–47.0)
HEMOGLOBIN: 12.7 g/dL (ref 12.0–16.0)
Lymphocytes Relative: 28 %
Lymphs Abs: 1.8 10*3/uL (ref 1.0–3.6)
MCH: 29.6 pg (ref 26.0–34.0)
MCHC: 34.1 g/dL (ref 32.0–36.0)
MCV: 86.8 fL (ref 80.0–100.0)
Monocytes Absolute: 0.3 10*3/uL (ref 0.2–0.9)
Monocytes Relative: 5 %
NEUTROS ABS: 4.2 10*3/uL (ref 1.4–6.5)
NEUTROS PCT: 62 %
Platelets: 246 10*3/uL (ref 150–440)
RBC: 4.3 MIL/uL (ref 3.80–5.20)
RDW: 14.7 % — ABNORMAL HIGH (ref 11.5–14.5)
WBC: 6.7 10*3/uL (ref 3.6–11.0)

## 2018-04-23 LAB — PATHOLOGIST SMEAR REVIEW

## 2018-04-23 MED ORDER — CYANOCOBALAMIN 1000 MCG/ML IJ SOLN
1000.0000 ug | INTRAMUSCULAR | Status: DC
  Administered 2018-04-23: 1000 ug via INTRAMUSCULAR

## 2018-04-24 ENCOUNTER — Ambulatory Visit: Payer: PPO | Admitting: Cardiology

## 2018-04-24 ENCOUNTER — Encounter: Payer: Self-pay | Admitting: Cardiology

## 2018-04-24 VITALS — BP 136/82 | Ht 63.0 in | Wt 221.0 lb

## 2018-04-24 DIAGNOSIS — I10 Essential (primary) hypertension: Secondary | ICD-10-CM

## 2018-04-24 DIAGNOSIS — I509 Heart failure, unspecified: Secondary | ICD-10-CM | POA: Insufficient documentation

## 2018-04-24 DIAGNOSIS — R002 Palpitations: Secondary | ICD-10-CM | POA: Diagnosis not present

## 2018-04-24 DIAGNOSIS — R079 Chest pain, unspecified: Secondary | ICD-10-CM

## 2018-04-24 HISTORY — DX: Palpitations: R00.2

## 2018-04-24 LAB — BASIC METABOLIC PANEL
BUN/Creatinine Ratio: 7 — ABNORMAL LOW (ref 9–23)
BUN: 7 mg/dL (ref 6–24)
CALCIUM: 9.8 mg/dL (ref 8.7–10.2)
CHLORIDE: 106 mmol/L (ref 96–106)
CO2: 24 mmol/L (ref 20–29)
Creatinine, Ser: 0.97 mg/dL (ref 0.57–1.00)
GFR calc non Af Amer: 69 mL/min/{1.73_m2} (ref >59)
GFR, EST AFRICAN AMERICAN: 79 mL/min/{1.73_m2} (ref >59)
Glucose: 94 mg/dL (ref 65–99)
Potassium: 4.1 mmol/L (ref 3.5–5.2)
Sodium: 145 mmol/L — ABNORMAL HIGH (ref 134–144)

## 2018-04-24 LAB — TROPONIN I

## 2018-04-24 MED ORDER — NITROGLYCERIN 0.4 MG SL SUBL: 0.4000 mg | SUBLINGUAL_TABLET | SUBLINGUAL | 3 refills | Status: DC | PRN

## 2018-04-24 MED ORDER — ASPIRIN 81 MG PO TABS: 81.0000 mg | ORAL_TABLET | Freq: Every day | ORAL | Status: DC

## 2018-04-24 MED ORDER — METOPROLOL TARTRATE 25 MG PO TABS: 25.0000 mg | ORAL_TABLET | Freq: Two times a day (BID) | ORAL | 3 refills | Status: DC

## 2018-04-24 NOTE — Patient Instructions (Addendum)
Medication Instructions:  Your physician has recommended you make the following change in your medication:   START: Aspirin 81 mg daily, Lopressor 25 mg twice daily, Nitroglycerin 0.4 mg sublingual (under your tongue) as needed for chest pain. If experiencing chest pain, stop what you are doing and sit down. Take 1 nitroglycerin and wait 5 minutes. If chest pain continues, take another nitroglycerin and wait 5 minutes. If chest pain does not subside, take 1 more nitroglycerin and dial 911. You make take a total of 3 nitroglycerin in a 15 minute time frame.   Labwork: Your physician recommends that you return for lab work today: Troponin I, and BMP.  Testing/Procedures: A chest x-ray takes a picture of the organs and structures inside the chest, including the heart, lungs, and blood vessels. This test can show several things, including, whether the heart is enlarges; whether fluid is building up in the lungs; and whether pacemaker / defibrillator leads are still in place.     [place] [facility] AT  [place] [address] Dept: [phone] Loc: [place]  04/24/2018  You are scheduled for a Cardiac Catheterization on Friday, August 9 with Dr. Harrell Gave End.  1. Please arrive at the [facility] (Main Entrance A) at [facility]: [place], [place] [address] at 10:00 AM (This time is two hours before your procedure to ensure your preparation). Free valet parking service is available.   Special note: Every effort is made to have your procedure done on time. Please understand that emergencies sometimes delay scheduled procedures.  2. Diet: Do not eat solid foods after midnight.  The patient may have clear liquids until 5am upon the day of the procedure.  3. Labs: You will need to have blood drawn today.  4. Medication instructions in preparation for your  procedure:   Contrast Allergy: No    Stop taking, losartan-hydrochlorothiazide on Friday, August 9.    On the morning of your procedure, take your Aspirin and any morning medicines NOT listed above.  You may use sips of water.  5. Plan for one night stay--bring personal belongings. 6. Bring a current list of your medications and current insurance cards. 7. You MUST have a responsible person to drive you home. 8. Someone MUST be with you the first 24 hours after you arrive home or your discharge will be delayed. 9. Please wear clothes that are easy to get on and off and wear slip-on shoes.  Thank you for allowing [place] to care for you!   -- [facility]   Follow-Up: Your physician recommends that you schedule a follow-up appointment in: 1 month.   Any Other Special Instructions Will Be Listed Below (If Applicable).     If you need a refill on your cardiac medications before your next appointment, please call your pharmacy.   Aspirin and Your Heart Aspirin is a medicine that affects the way blood clots. Aspirin can be used to help reduce the risk of blood clots, heart attacks, and other heart-related problems. Should I take aspirin? Your health care provider will help you determine whether it is safe and beneficial for you to take aspirin daily. Taking aspirin daily may be beneficial if you:  Have had a heart attack or chest pain.  Have undergone open heart surgery such as coronary artery bypass surgery (CABG).  Have had coronary angioplasty.  Have experienced a stroke or  transient ischemic attack (TIA).  Have peripheral vascular disease (PVD).  Have chronic heart rhythm problems such as atrial fibrillation.  Are there any risks of taking aspirin daily? Daily use of aspirin can increase your risk of side effects. Some of these include:  Bleeding. Bleeding problems can be minor or serious. An example of a minor problem is a cut that does  not stop bleeding. An example of a more serious problem is stomach bleeding or bleeding into the brain. Your risk of bleeding is increased if you are also taking non-steroidal anti-inflammatory medicine (NSAIDs).  Increased bruising.  Upset stomach.  An allergic reaction. People who have nasal polyps have an increased risk of developing an aspirin allergy.  What are some guidelines I should follow when taking aspirin?  Take aspirin only as directed by your health care provider. Make sure you understand how much you should take and what form you should take. The two forms of aspirin are: ? Non-enteric-coated. This type of aspirin does not have a coating and is absorbed quickly. Non-enteric-coated aspirin is usually recommended for people with chest pain. This type of aspirin also comes in a chewable form. ? Enteric-coated. This type of aspirin has a special coating that releases the medicine very slowly. Enteric-coated aspirin causes less stomach upset than non-enteric-coated aspirin. This type of aspirin should not be chewed or crushed.  Drink alcohol in moderation. Drinking alcohol increases your risk of bleeding. When should I seek medical care?  You have unusual bleeding or bruising.  You have stomach pain.  You have an allergic reaction. Symptoms of an allergic reaction include: ? Hives. ? Itchy skin. ? Swelling of the lips, tongue, or face.  You have ringing in your ears. When should I seek immediate medical care?  Your bowel movements are bloody, dark red, or black in color.  You vomit or cough up blood.  You have blood in your urine.  You cough, wheeze, or feel short of breath. If you have any of the following symptoms, this is an emergency. Do not wait to see if the pain will go away. Get medical help at once. Call your local emergency services (911 in the [place] Do not drive yourself to the hospital.  You have severe chest pain, especially if the pain is crushing or  pressure-like and spreads to the arms, back, neck, or jaw.  You have stroke-like symptoms, such as: ? Loss of vision. ? Difficulty talking. ? Numbness or weakness on one side of your body. ? Numbness or weakness in your arm or leg. ? Not thinking clearly or feeling confused.  This information is not intended to replace advice given to you by your health care provider. Make sure you discuss any questions you have with your health care provider. Document Released: 08/17/2008 Document Revised: 01/12/2016 Document Reviewed: 12/10/2013 Elsevier Interactive Patient Education  2018 [employer].  Metoprolol tablets What is this medicine? METOPROLOL (me TOE proe lole) is a beta-blocker. Beta-blockers reduce the workload on the heart and help it to beat more regularly. This medicine is used to treat high blood pressure and to prevent chest pain. It is also used to after a heart attack and to prevent an additional heart attack from occurring. This medicine may be used for other purposes; ask your health care provider or pharmacist if you have questions. COMMON BRAND NAME(S): Lopressor What should I tell my health care provider before I take this medicine? They need to know if you have any of  these conditions: -diabetes -heart or vessel disease like slow heart rate, worsening heart failure, heart block, sick sinus syndrome or Raynaud's disease -kidney disease -liver disease -lung or breathing disease, like asthma or emphysema -pheochromocytoma -thyroid disease -an unusual or allergic reaction to metoprolol, other beta-blockers, medicines, foods, dyes, or preservatives -pregnant or trying to get pregnant -breast-feeding How should I use this medicine? Take this medicine by mouth with a drink of water. Follow the directions on the prescription label. Take this medicine immediately after meals. Take your doses at regular intervals. Do not take more medicine than directed. Do not stop taking this  medicine suddenly. This could lead to serious heart-related effects. Talk to your pediatrician regarding the use of this medicine in children. Special care may be needed. Overdosage: If you think you have taken too much of this medicine contact a poison control center or emergency room at once. NOTE: This medicine is only for you. Do not share this medicine with others. What if I miss a dose? If you miss a dose, take it as soon as you can. If it is almost time for your next dose, take only that dose. Do not take double or extra doses. What may interact with this medicine? This medicine may interact with the following medications: -certain medicines for blood pressure, heart disease, irregular heart beat -certain medicines for depression like monoamine oxidase (MAO) inhibitors, fluoxetine, or paroxetine -clonidine -dobutamine -epinephrine -isoproterenol -reserpine This list may not describe all possible interactions. Give your health care provider a list of all the medicines, herbs, non-prescription drugs, or dietary supplements you use. Also tell them if you smoke, drink alcohol, or use illegal drugs. Some items may interact with your medicine. What should I watch for while using this medicine? Visit your doctor or health care professional for regular check ups. Contact your doctor right away if your symptoms worsen. Check your blood pressure and pulse rate regularly. Ask your health care professional what your blood pressure and pulse rate should be, and when you should contact them. You may get drowsy or dizzy. Do not drive, use machinery, or do anything that needs mental alertness until you know how this medicine affects you. Do not sit or stand up quickly, especially if you are an older patient. This reduces the risk of dizzy or fainting spells. Contact your doctor if these symptoms continue. Alcohol may interfere with the effect of this medicine. Avoid alcoholic drinks. What side effects may  I notice from receiving this medicine? Side effects that you should report to your doctor or health care professional as soon as possible: -allergic reactions like skin rash, itching or hives -cold or numb hands or feet -depression -difficulty breathing -faint -fever with sore throat -irregular heartbeat, chest pain -rapid weight gain -swollen legs or ankles Side effects that usually do not require medical attention (report to your doctor or health care professional if they continue or are bothersome): -anxiety or nervousness -change in sex drive or performance -dry skin -headache -nightmares or trouble sleeping -short term memory loss -stomach upset or diarrhea -unusually tired This list may not describe all possible side effects. Call your doctor for medical advice about side effects. You may report side effects to FDA at 1-800-FDA-1088. Where should I keep my medicine? Keep out of the reach of children. Store at room temperature between 15 and 30 degrees C (59 and 86 degrees F). Throw away any unused medicine after the expiration date. NOTE: This sheet is a summary. It may  not cover all possible information. If you have questions about this medicine, talk to your doctor, pharmacist, or health care provider.  2018 Elsevier/Gold Standard (2013-05-09 14:40:36)  Nitroglycerin sublingual tablets What is this medicine? NITROGLYCERIN (nye troe GLI ser in) is a type of vasodilator. It relaxes blood vessels, increasing the blood and oxygen supply to your heart. This medicine is used to relieve chest pain caused by angina. It is also used to prevent chest pain before activities like climbing stairs, going outdoors in cold weather, or sexual activity. This medicine may be used for other purposes; ask your health care provider or pharmacist if you have questions. COMMON BRAND NAME(S): Nitroquick, Nitrostat, Nitrotab What should I tell my health care provider before I take this medicine? They  need to know if you have any of these conditions: -anemia -head injury, recent stroke, or bleeding in the brain -liver disease -previous heart attack -an unusual or allergic reaction to nitroglycerin, other medicines, foods, dyes, or preservatives -pregnant or trying to get pregnant -breast-feeding How should I use this medicine? Take this medicine by mouth as needed. At the first sign of an angina attack (chest pain or tightness) place one tablet under your tongue. You can also take this medicine 5 to 10 minutes before an event likely to produce chest pain. Follow the directions on the prescription label. Let the tablet dissolve under the tongue. Do not swallow whole. Replace the dose if you accidentally swallow it. It will help if your mouth is not dry. Saliva around the tablet will help it to dissolve more quickly. Do not eat or drink, smoke or chew tobacco while a tablet is dissolving. If you are not better within 5 minutes after taking ONE dose of nitroglycerin, call 9-1-1 immediately to seek emergency medical care. Do not take more than 3 nitroglycerin tablets over 15 minutes. If you take this medicine often to relieve symptoms of angina, your doctor or health care professional may provide you with different instructions to manage your symptoms. If symptoms do not go away after following these instructions, it is important to call 9-1-1 immediately. Do not take more than 3 nitroglycerin tablets over 15 minutes. Talk to your pediatrician regarding the use of this medicine in children. Special care may be needed. Overdosage: If you think you have taken too much of this medicine contact a poison control center or emergency room at once. NOTE: This medicine is only for you. Do not share this medicine with others. What if I miss a dose? This does not apply. This medicine is only used as needed. What may interact with this medicine? Do not take this medicine with any of the following  medications: -certain migraine medicines like ergotamine and dihydroergotamine (DHE) -medicines used to treat erectile dysfunction like sildenafil, tadalafil, and vardenafil -riociguat This medicine may also interact with the following medications: -alteplase -aspirin -heparin -medicines for high blood pressure -medicines for mental depression -other medicines used to treat angina -phenothiazines like chlorpromazine, mesoridazine, prochlorperazine, thioridazine This list may not describe all possible interactions. Give your health care provider a list of all the medicines, herbs, non-prescription drugs, or dietary supplements you use. Also tell them if you smoke, drink alcohol, or use illegal drugs. Some items may interact with your medicine. What should I watch for while using this medicine? Tell your doctor or health care professional if you feel your medicine is no longer working. Keep this medicine with you at all times. Sit or lie down when you take  your medicine to prevent falling if you feel dizzy or faint after using it. Try to remain calm. This will help you to feel better faster. If you feel dizzy, take several deep breaths and lie down with your feet propped up, or bend forward with your head resting between your knees. You may get drowsy or dizzy. Do not drive, use machinery, or do anything that needs mental alertness until you know how this drug affects you. Do not stand or sit up quickly, especially if you are an older patient. This reduces the risk of dizzy or fainting spells. Alcohol can make you more drowsy and dizzy. Avoid alcoholic drinks. Do not treat yourself for coughs, colds, or pain while you are taking this medicine without asking your doctor or health care professional for advice. Some ingredients may increase your blood pressure. What side effects may I notice from receiving this medicine? Side effects that you should report to your doctor or health care professional as  soon as possible: -blurred vision -dry mouth -skin rash -sweating -the feeling of extreme pressure in the head -unusually weak or tired Side effects that usually do not require medical attention (report to your doctor or health care professional if they continue or are bothersome): -flushing of the face or neck -headache -irregular heartbeat, palpitations -nausea, vomiting This list may not describe all possible side effects. Call your doctor for medical advice about side effects. You may report side effects to FDA at 1-800-FDA-1088. Where should I keep my medicine? Keep out of the reach of children. Store at room temperature between 20 and 25 degrees C (68 and 77 degrees F). Store in [employer]. Protect from light and moisture. Keep tightly closed. Throw away any unused medicine after the expiration date. NOTE: This sheet is a summary. It may not cover all possible information. If you have questions about this medicine, talk to your doctor, pharmacist, or health care provider.  2018 Elsevier/Gold Standard (2013-07-03 17:57:36)

## 2018-04-24 NOTE — Progress Notes (Signed)
Cardiology Office Note:    Date:  04/24/2018   ID:  [name], DOB [birth year], MRN [id number]  PCP:  Algis Greenhouse, MD  Cardiologist:  Shirlee More, MD    Referring MD: Algis Greenhouse, MD    ASSESSMENT:    1. Chest pain in adult   2. Palpitation   3. Benign hypertension   4. Chronic systolic heart failure (HCC)    PLAN:    In order of problems listed above:  1. Symptoms have rapidly progressed consistent with unstable angina EKG has ischemic T wave changes Worsened check troponin and plan coronary angiography 2. No obvious arrhythmia we can address this following left heart catheterization 3. Stable I am going to add a beta-blocker for both hypertension and CAD   Next appointment: 2 to 4 weeks   Medication Adjustments/Labs and Tests Ordered: Current medicines are reviewed at length with the patient today.  Concerns regarding medicines are outlined above.  Orders Placed This Encounter  Procedures  . Troponin I  . Basic metabolic panel  . EKG 12-Lead   Meds ordered this encounter  Medications  . aspirin 81 MG tablet    Sig: Take 1 tablet (81 mg total) by mouth daily.    Dispense:  30 tablet  . metoprolol tartrate (LOPRESSOR) 25 MG tablet    Sig: Take 1 tablet (25 mg total) by mouth 2 (two) times daily.    Dispense:  180 tablet    Refill:  3  . nitroGLYCERIN (NITROSTAT) 0.4 MG SL tablet    Sig: Place 1 tablet (0.4 mg total) under the tongue every 5 (five) minutes as needed for chest pain.    Dispense:  90 tablet    Refill:  3    Chief Complaint  Patient presents with  . Coronary Artery Disease  . Palpitations  . Hypertension    History of Present Illness:    [name] is a 47 y.o. female with a hx of hypertension, Dandy walker syndrome, hydrocephalus with a VP shunt, last seen by me 09/15/15. Echo at that time mild LVH OW normal and a MPI was also normal. She is having increasing palpitation and frequent exertional and non exertional chest  pain now daily.  She was seen by cardiologist in [place] in the last year she was diagnosed as angina in the last office visit said that if symptoms worsen should be coronary angiography. She was at [facility] hospital ED the last few weeks was diagnosed as [facility] spotted fever treated with antibiotic but also a chest pain was noted to have an abnormal EKG with ischemic T waves.  She tells me she has a long history of chest pain syndrome previous normal myocardial perfusion study however in the last month she has had a rapid acceleration of chest pain pressure with stress emotional physical activity more than ADLs and walking in the home relieved with rest and 10 to 15 minutes she describes the pain as severe associated shortness of breath and palpitation.  She is also had episodes at rest and now occurs at least daily.  With her abnormal EKG ischemic changes rapid acceleration of symptoms consistent with unstable angina we will draw a troponin today and plan coronary angiography.  She has a history of a chronic subdural hematoma I put her on low-dose aspirin and if PCI and stent is recommended she would benefit from neurologic evaluation before hand for safety of dual antiplatelet drugs.  She has not  seen a neurologist at this time.  I reviewed the option benefit and risk of left heart catheterization with the patient and husband consent Compliance with diet, lifestyle and medications: Yes Past Medical History:  Diagnosis Date  . Acute pain of left knee 12/06/2017  . Acute sinusitis, unspecified 08/29/2015  . Allergic rhinitis 11/25/2015  . Allergic state 10/29/2017   Overview:  Seasonal  . ANA positive 01/04/2016  . Anxiety   . Arthritis   . Asthma   . B12 deficiency 03/05/2018  . Benign hypertension 10/25/2015  . Chronic fatigue 11/13/2017  . Chronic joint pain 12/02/2015  . Chronic pain syndrome 01/11/2016  . 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 Connective tissue disease ([place])  01/04/2016  . Convulsions ([place]) 03/05/2014  . 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 Depression   . Dizziness 03/05/2014  . Epilepsy ([place]) 04/10/2016  . Fibroadenoma of left breast 02/12/2014  . Generalized abdominal pain 08/28/2015  . Generalized anxiety disorder 10/27/2015  . GERD (gastroesophageal reflux disease)   . H. pylori infection 2015  . Herpes genitalia   . Hydrocephalus 08/29/2015  . Hyperlipidemia   . Hyperlipidemia, mixed 05/28/2016  . Hypertension   . Increased frequency of urination 09/03/2017  . Internal derangement of right knee 11/25/2015   Overview:  2017  . Lupus ([place])    Dr. Lucky Cowboy informed pt she does not have lupus  . Mastalgia 01/27/2014  . Migraine without status migrainosus, not intractable 11/25/2015  . Osteoarthritis of both knees 01/11/2016  . Personal history of healed traumatic fracture 09/15/2015  . Plantar fasciitis 11/07/2016  . Prediabetes 05/28/2016   Overview:  2018: 116/5.8  . Rectal bleeding 11/17/2014  . Recurrent major depressive disorder, in remission ([place]) 10/27/2015   Overview:  2017: Situational  . Right lower quadrant abdominal pain 06/29/2017  . 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 Screening for breast cancer 11/16/2016  . Seizures ([place])    last seizures 4-5 years ago.  . Subdural hematoma [facility]) July 2017   Bilateral  . Thyroid function test abnormal 12/06/2017   2019: TSH =7.3  . Tightness of heel cord, left 11/07/2016  . Vitamin D deficiency 06/29/2015  . Weight gain 08/28/2015    Past Surgical History:  Procedure Laterality Date  . ABDOMINAL HYSTERECTOMY  2008  . BRAIN SURGERY  02/2016   removal of 2 blood clotts from the brain.  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 BREAST EXCISIONAL BIOPSY Left 01/2014   lymph node  . BREAST SURGERY Left 02/04/14   left core bx identifying a fibroadenoma and excision of left axillary lymph node, benign  . BURR HOLE FOR SUBDURAL HEMATOMA  March 18, 2016  . CHOLECYSTECTOMY N/A 04/09/2017   Procedure:  LAPAROSCOPIC CHOLECYSTECTOMY WITH INTRAOPERATIVE CHOLANGIOGRAM;  Surgeon: Robert Bellow, MD;  Location: [facility] ORS;  Service: General;  Laterality: N/A;  . COLONOSCOPY  12/21/2015  . COLONOSCOPY W/ BIOPSIES  12/08/2016   Tubular adenoma the sigmoid. No dysplasia. Benign colonic biopsies without evidence of colitis. [name], [name] [facility]  . FRACTURE SURGERY Right 2005   hand  . LAPAROSCOPIC REVISION VENTRICULAR-PERITONEAL (V-P) SHUNT  2000  . LEG SURGERY Left 2002  . NECK SURGERY  1985  . POSTERIOR LAMINECTOMY THORACIC AND LUMBAR SPINE Bilateral 1985   Scoliosis stabilization  . SHUNT REVISION  2003 & July 2017  . SPINE SURGERY  1985   Scoliosis  . TUBAL LIGATION  1995  . UPPER GI ENDOSCOPY  12/08/2016   Hypertrophic gastric polyp, mild chronic gastritis. No evidence of H. pylori. [name], [name], [facility]    Current Medications: Current Meds  Medication Sig  . albuterol (PROVENTIL HFA;VENTOLIN HFA) 108 (90 Base) MCG/ACT inhaler Inhale 2 puffs into the lungs every 6 (six) hours as needed.  [name] atorvastatin (LIPITOR) 40 MG tablet Take 40 mg by mouth daily. In am.  . buPROPion (WELLBUTRIN SR) 100 MG 12 hr tablet Take 100 mg by mouth daily. In am  . cetirizine (ZYRTEC) 10 MG tablet Take 10 mg by mouth daily. In am  . citalopram (CELEXA) 20 MG tablet Take 20 mg by mouth daily. In am.  . cyclobenzaprine (FLEXERIL) 10 MG tablet Take 1 tablet (10 mg total) by mouth 3 (three) times daily.  [name] dicyclomine (BENTYL) 20 MG tablet Take 1 tablet (20 mg total) by mouth 3 (three) times daily as needed for spasms.  [name] ipratropium-albuterol (DUONEB) 0.5-2.5 (3) MG/3ML SOLN Inhale into the lungs.  [name] losartan-hydrochlorothiazide (HYZAAR) 50-12.5 MG tablet Take one tablet once daily (Patient taking differently: Take 1 tablet by mouth every morning. Take one tablet once daily)  . montelukast (SINGULAIR) 10 MG tablet TAKE ONE TABLET BY MOUTH AT BEDTIME  .  pantoprazole (PROTONIX) 40 MG tablet TAKE ONE TABLET BY MOUTH ONCE DAILY  . ranitidine (ZANTAC) 300 MG tablet Take 1 tablet (300 mg total) by mouth at bedtime.  . rizatriptan (MAXALT) 10 MG tablet Take 1 tablet by mouth daily as needed for migraine.   . topiramate (TOPAMAX) 100 MG tablet Take 100 mg by mouth 2 (two) times daily.   . valACYclovir (VALTREX) 500 MG tablet Take 500 mg by mouth daily.      Allergies:   Doxycycline hyclate; Levofloxacin; Morphine; Penicillins; Sulfa antibiotics; Tramadol; Sumatriptan; Clindamycin; Codeine; and Diclofenac potassium   Social History   Socioeconomic History  . Marital status: Married    Spouse name: Not on file  . Number of children: Not on file  . Years of education: Not on file  . Highest education level: Not on file  Occupational History  . Not on file  Social Needs  . Financial resource strain: Not on file  . Food insecurity:    Worry: Not on file    Inability: Not on file  . Transportation needs:    Medical: Not on file    Non-medical: Not on file  Tobacco Use  . Smoking status: Never Smoker  . Smokeless tobacco: Never Used  Substance and Sexual Activity  . Alcohol use: No  . Drug use: No  . Sexual activity: Not Currently  Lifestyle  . Physical activity:    Days per week: 5 days    Minutes per session: 30 min  . Stress: Not at all  Relationships  . Social connections:    Talks on phone: Not on file    Gets together: Not on file    Attends religious service: Not on file    Active member of club or organization: Not on file    Attends meetings of clubs or organizations: Not on file    Relationship status: Not on file  Other Topics Concern  . Not on file  Social History Narrative  . Not on file     Family History: The patient's family history includes Brain cancer in her sister; Colon polyps in her mother; Leukemia in her maternal aunt; Leukemia (age of onset: 3) in her mother; Lung cancer in her father. ROS:  Review  of Systems  Constitution: Positive for chills.  HENT: Negative.   Eyes: Negative.   Cardiovascular: Positive for chest pain, dyspnea on exertion, leg swelling and palpitations.  Respiratory: Positive for shortness of breath and wheezing.   Endocrine: Negative.   Hematologic/Lymphatic: Bruises/bleeds easily.  Skin: Positive for rash.  Musculoskeletal: Positive for myalgias.  Gastrointestinal: Negative.   Genitourinary: Negative.   Neurological: Positive for headaches.  Psychiatric/Behavioral: Negative.   Allergic/Immunologic: Negative.    Please see the history of present illness.    All other systems reviewed and are negative.  EKGs/Labs/Other Studies Reviewed:    The following studies were reviewed today:  EKG:  EKG ordered today.  The ekg ordered today demonstrates sinus rhythm diffuse ischemic T wave inversion although I cannot see the EKG from [facility] recently by description this is worsened in the ischemic T wave abnormality was new on that EKG  Recent Labs:   04/07/18 CMP TSH  And CBC normal 03/19/2018: ALT 21; BUN 14; Creatinine, Ser 1.03; Potassium 3.7; Sodium 143 04/23/2018: Hemoglobin 12.7; Platelets 246  Recent Lipid Panel  Chol 283, HDL 40 LDL193 No results found for: CHOL, TRIG, HDL, CHOLHDL, VLDL, LDLCALC, LDLDIRECT  Physical Exam:    VS:  BP 136/82 (BP Location: Right Arm, Patient Position: Sitting, Cuff Size: Normal)   Ht 5\' 3"  (1.6 m)   Wt 221 lb (100.2 kg)   SpO2 98%   BMI 39.15 kg/m     Wt Readings from Last 3 Encounters:  04/24/18 221 lb (100.2 kg)  03/05/18 223 lb (101.2 kg)  02/22/18 220 lb (99.8 kg)     GEN:  Well nourished, well developed in no acute distress HEENT: Normal NECK: No JVD; No carotid bruits LYMPHATICS: No lymphadenopathy CARDIAC: RRR, no murmurs, rubs, gallops RESPIRATORY:  Clear to auscultation without rales, wheezing or rhonchi  ABDOMEN: Soft, non-tender, non-distended MUSCULOSKELETAL:  No edema; No  deformity  SKIN: Warm and dry NEUROLOGIC:  Alert and oriented x 3 PSYCHIATRIC:  Normal affect    Signed, Shirlee More, MD  04/24/2018 12:32 PM    [facility]

## 2018-04-24 NOTE — H&P (View-Only) (Signed)
Cardiology Office Note:    Date:  04/24/2018   ID:  [name], DOB 1971-09-04, MRN [id number]  PCP:  Algis Greenhouse, MD  Cardiologist:  Shirlee More, MD    Referring MD: Algis Greenhouse, MD    ASSESSMENT:    1. Chest pain in adult   2. Palpitation   3. Benign hypertension   4. Chronic systolic heart failure (HCC)    PLAN:    In order of problems listed above:  1. Symptoms have rapidly progressed consistent with unstable angina EKG has ischemic T wave changes Worsened check troponin and plan coronary angiography 2. No obvious arrhythmia we can address this following left heart catheterization 3. Stable I am going to add a beta-blocker for both hypertension and CAD   Next appointment: 2 to 4 weeks   Medication Adjustments/Labs and Tests Ordered: Current medicines are reviewed at length with the patient today.  Concerns regarding medicines are outlined above.  Orders Placed This Encounter  Procedures  . Troponin I  . Basic metabolic panel  . EKG 12-Lead   Meds ordered this encounter  Medications  . aspirin 81 MG tablet    Sig: Take 1 tablet (81 mg total) by mouth daily.    Dispense:  30 tablet  . metoprolol tartrate (LOPRESSOR) 25 MG tablet    Sig: Take 1 tablet (25 mg total) by mouth 2 (two) times daily.    Dispense:  180 tablet    Refill:  3  . nitroGLYCERIN (NITROSTAT) 0.4 MG SL tablet    Sig: Place 1 tablet (0.4 mg total) under the tongue every 5 (five) minutes as needed for chest pain.    Dispense:  90 tablet    Refill:  3    Chief Complaint  Patient presents with  . Coronary Artery Disease  . Palpitations  . Hypertension    History of Present Illness:    [name] is a 47 y.o. female with a hx of hypertension, Dandy walker syndrome, hydrocephalus with a VP shunt, last seen by me 09/15/15. Echo at that time mild LVH OW normal and a MPI was also normal. She is having increasing palpitation and frequent exertional and non exertional chest  pain now daily.  She was seen by cardiologist in [place] in the last year she was diagnosed as angina in the last office visit said that if symptoms worsen should be coronary angiography. She was at [facility] hospital ED the last few weeks was diagnosed as [facility] spotted fever treated with antibiotic but also a chest pain was noted to have an abnormal EKG with ischemic T waves.  She tells me she has a long history of chest pain syndrome previous normal myocardial perfusion study however in the last month she has had a rapid acceleration of chest pain pressure with stress emotional physical activity more than ADLs and walking in the home relieved with rest and 10 to 15 minutes she describes the pain as severe associated shortness of breath and palpitation.  She is also had episodes at rest and now occurs at least daily.  With her abnormal EKG ischemic changes rapid acceleration of symptoms consistent with unstable angina we will draw a troponin today and plan coronary angiography.  She has a history of a chronic subdural hematoma I put her on low-dose aspirin and if PCI and stent is recommended she would benefit from neurologic evaluation before hand for safety of dual antiplatelet drugs.  She has not  seen a neurologist at this time.  I reviewed the option benefit and risk of left heart catheterization with the patient and husband consent Compliance with diet, lifestyle and medications: Yes Past Medical History:  Diagnosis Date  . Acute pain of left knee 12/06/2017  . Acute sinusitis, unspecified 08/29/2015  . Allergic rhinitis 11/25/2015  . Allergic state 10/29/2017   Overview:  Seasonal  . ANA positive 01/04/2016  . Anxiety   . Arthritis   . Asthma   . B12 deficiency 03/05/2018  . Benign hypertension 10/25/2015  . Chronic fatigue 11/13/2017  . Chronic joint pain 12/02/2015  . Chronic pain syndrome 01/11/2016  . 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 Connective tissue disease ([place])  01/04/2016  . Convulsions ([place]) 03/05/2014  . 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 Depression   . Dizziness 03/05/2014  . Epilepsy ([place]) 04/10/2016  . Fibroadenoma of left breast 02/12/2014  . Generalized abdominal pain 08/28/2015  . Generalized anxiety disorder 10/27/2015  . GERD (gastroesophageal reflux disease)   . H. pylori infection 2015  . Herpes genitalia   . Hydrocephalus 08/29/2015  . Hyperlipidemia   . Hyperlipidemia, mixed 05/28/2016  . Hypertension   . Increased frequency of urination 09/03/2017  . Internal derangement of right knee 11/25/2015   Overview:  2017  . Lupus ([place])    Dr. Lucky Cowboy informed pt she does not have lupus  . Mastalgia 01/27/2014  . Migraine without status migrainosus, not intractable 11/25/2015  . Osteoarthritis of both knees 01/11/2016  . Personal history of healed traumatic fracture 09/15/2015  . Plantar fasciitis 11/07/2016  . Prediabetes 05/28/2016   Overview:  2018: 116/5.8  . Rectal bleeding 11/17/2014  . Recurrent major depressive disorder, in remission ([place]) 10/27/2015   Overview:  2017: Situational  . Right lower quadrant abdominal pain 06/29/2017  . 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 Screening for breast cancer 11/16/2016  . Seizures ([place])    last seizures 4-5 years ago.  . Subdural hematoma [facility]) July 2017   Bilateral  . Thyroid function test abnormal 12/06/2017   2019: TSH =7.3  . Tightness of heel cord, left 11/07/2016  . Vitamin D deficiency 06/29/2015  . Weight gain 08/28/2015    Past Surgical History:  Procedure Laterality Date  . ABDOMINAL HYSTERECTOMY  2008  . BRAIN SURGERY  02/2016   removal of 2 blood clotts from the brain.  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 BREAST EXCISIONAL BIOPSY Left 01/2014   lymph node  . BREAST SURGERY Left 02/04/14   left core bx identifying a fibroadenoma and excision of left axillary lymph node, benign  . BURR HOLE FOR SUBDURAL HEMATOMA  March 18, 2016  . CHOLECYSTECTOMY N/A 04/09/2017   Procedure:  LAPAROSCOPIC CHOLECYSTECTOMY WITH INTRAOPERATIVE CHOLANGIOGRAM;  Surgeon: Robert Bellow, MD;  Location: [facility] ORS;  Service: General;  Laterality: N/A;  . COLONOSCOPY  12/21/2015  . COLONOSCOPY W/ BIOPSIES  12/08/2016   Tubular adenoma the sigmoid. No dysplasia. Benign colonic biopsies without evidence of colitis. [name], [name] [facility]  . FRACTURE SURGERY Right 2005   hand  . LAPAROSCOPIC REVISION VENTRICULAR-PERITONEAL (V-P) SHUNT  2000  . LEG SURGERY Left 2002  . NECK SURGERY  1985  . POSTERIOR LAMINECTOMY THORACIC AND LUMBAR SPINE Bilateral 1985   Scoliosis stabilization  . SHUNT REVISION  2003 & July 2017  . SPINE SURGERY  1985   Scoliosis  . TUBAL LIGATION  1995  . UPPER GI ENDOSCOPY  12/08/2016   Hypertrophic gastric polyp, mild chronic gastritis. No evidence of H. pylori. [name], [name], [facility]    Current Medications: Current Meds  Medication Sig  . albuterol (PROVENTIL HFA;VENTOLIN HFA) 108 (90 Base) MCG/ACT inhaler Inhale 2 puffs into the lungs every 6 (six) hours as needed.  [name] atorvastatin (LIPITOR) 40 MG tablet Take 40 mg by mouth daily. In am.  . buPROPion (WELLBUTRIN SR) 100 MG 12 hr tablet Take 100 mg by mouth daily. In am  . cetirizine (ZYRTEC) 10 MG tablet Take 10 mg by mouth daily. In am  . citalopram (CELEXA) 20 MG tablet Take 20 mg by mouth daily. In am.  . cyclobenzaprine (FLEXERIL) 10 MG tablet Take 1 tablet (10 mg total) by mouth 3 (three) times daily.  [name] dicyclomine (BENTYL) 20 MG tablet Take 1 tablet (20 mg total) by mouth 3 (three) times daily as needed for spasms.  [name] ipratropium-albuterol (DUONEB) 0.5-2.5 (3) MG/3ML SOLN Inhale into the lungs.  [name] losartan-hydrochlorothiazide (HYZAAR) 50-12.5 MG tablet Take one tablet once daily (Patient taking differently: Take 1 tablet by mouth every morning. Take one tablet once daily)  . montelukast (SINGULAIR) 10 MG tablet TAKE ONE TABLET BY MOUTH AT BEDTIME  .  pantoprazole (PROTONIX) 40 MG tablet TAKE ONE TABLET BY MOUTH ONCE DAILY  . ranitidine (ZANTAC) 300 MG tablet Take 1 tablet (300 mg total) by mouth at bedtime.  . rizatriptan (MAXALT) 10 MG tablet Take 1 tablet by mouth daily as needed for migraine.   . topiramate (TOPAMAX) 100 MG tablet Take 100 mg by mouth 2 (two) times daily.   . valACYclovir (VALTREX) 500 MG tablet Take 500 mg by mouth daily.      Allergies:   Doxycycline hyclate; Levofloxacin; Morphine; Penicillins; Sulfa antibiotics; Tramadol; Sumatriptan; Clindamycin; Codeine; and Diclofenac potassium   Social History   Socioeconomic History  . Marital status: Married    Spouse name: Not on file  . Number of children: Not on file  . Years of education: Not on file  . Highest education level: Not on file  Occupational History  . Not on file  Social Needs  . Financial resource strain: Not on file  . Food insecurity:    Worry: Not on file    Inability: Not on file  . Transportation needs:    Medical: Not on file    Non-medical: Not on file  Tobacco Use  . Smoking status: Never Smoker  . Smokeless tobacco: Never Used  Substance and Sexual Activity  . Alcohol use: No  . Drug use: No  . Sexual activity: Not Currently  Lifestyle  . Physical activity:    Days per week: 5 days    Minutes per session: 30 min  . Stress: Not at all  Relationships  . Social connections:    Talks on phone: Not on file    Gets together: Not on file    Attends religious service: Not on file    Active member of club or organization: Not on file    Attends meetings of clubs or organizations: Not on file    Relationship status: Not on file  Other Topics Concern  . Not on file  Social History Narrative  . Not on file     Family History: The patient's family history includes Brain cancer in her sister; Colon polyps in her mother; Leukemia in her maternal aunt; Leukemia (age of onset: 74) in her mother; Lung cancer in her father. ROS:  Review  of Systems  Constitution: Positive for chills.  HENT: Negative.   Eyes: Negative.   Cardiovascular: Positive for chest pain, dyspnea on exertion, leg swelling and palpitations.  Respiratory: Positive for shortness of breath and wheezing.   Endocrine: Negative.   Hematologic/Lymphatic: Bruises/bleeds easily.  Skin: Positive for rash.  Musculoskeletal: Positive for myalgias.  Gastrointestinal: Negative.   Genitourinary: Negative.   Neurological: Positive for headaches.  Psychiatric/Behavioral: Negative.   Allergic/Immunologic: Negative.    Please see the history of present illness.    All other systems reviewed and are negative.  EKGs/Labs/Other Studies Reviewed:    The following studies were reviewed today:  EKG:  EKG ordered today.  The ekg ordered today demonstrates sinus rhythm diffuse ischemic T wave inversion although I cannot see the EKG from [facility] recently by description this is worsened in the ischemic T wave abnormality was new on that EKG  Recent Labs:   04/07/18 CMP TSH  And CBC normal 03/19/2018: ALT 21; BUN 14; Creatinine, Ser 1.03; Potassium 3.7; Sodium 143 04/23/2018: Hemoglobin 12.7; Platelets 246  Recent Lipid Panel  Chol 283, HDL 40 LDL193 No results found for: CHOL, TRIG, HDL, CHOLHDL, VLDL, LDLCALC, LDLDIRECT  Physical Exam:    VS:  BP 136/82 (BP Location: Right Arm, Patient Position: Sitting, Cuff Size: Normal)   Ht 5\' 3"  (1.6 m)   Wt 221 lb (100.2 kg)   SpO2 98%   BMI 39.15 kg/m     Wt Readings from Last 3 Encounters:  04/24/18 221 lb (100.2 kg)  03/05/18 223 lb (101.2 kg)  02/22/18 220 lb (99.8 kg)     GEN:  Well nourished, well developed in no acute distress HEENT: Normal NECK: No JVD; No carotid bruits LYMPHATICS: No lymphadenopathy CARDIAC: RRR, no murmurs, rubs, gallops RESPIRATORY:  Clear to auscultation without rales, wheezing or rhonchi  ABDOMEN: Soft, non-tender, non-distended MUSCULOSKELETAL:  No edema; No  deformity  SKIN: Warm and dry NEUROLOGIC:  Alert and oriented x 3 PSYCHIATRIC:  Normal affect    Signed, Shirlee More, MD  04/24/2018 12:32 PM    [facility]

## 2018-04-25 ENCOUNTER — Encounter: Payer: Self-pay | Admitting: *Deleted

## 2018-04-25 ENCOUNTER — Telehealth: Payer: Self-pay | Admitting: *Deleted

## 2018-04-25 NOTE — Telephone Encounter (Signed)
Pt contacted pre-catheterization scheduled at [facility] for: Friday April 26, 2018 12 noon Verified arrival time and place: [place] Entrance A at: 10 AM  No solid food after midnight prior to cath, clear liquids until 5 AM day of procedure. Verified allergies in Epic Verified no diabetes medications.  Hold: Losartan-HCTZ AM of procedure.  Except hold medications AM meds can be  taken pre-cath with sip of water including: ASA 81 mg  Confirmed patient has responsible person to drive home post procedure and for 24 hours after you arrive home: yes

## 2018-04-26 ENCOUNTER — Encounter (HOSPITAL_COMMUNITY)
Admission: RE | Disposition: A | Discharge status: Home / Self Care | Payer: Self-pay | Source: Ambulatory Visit | Attending: Internal Medicine

## 2018-04-26 ENCOUNTER — Ambulatory Visit (HOSPITAL_COMMUNITY)
Admission: RE | Admit: 2018-04-26 | Discharge: 2018-04-26 | Disposition: A | Discharge status: Home / Self Care | Payer: PPO | Source: Ambulatory Visit | Attending: Internal Medicine | Admitting: Internal Medicine

## 2018-04-26 ENCOUNTER — Other Ambulatory Visit: Payer: Self-pay

## 2018-04-26 DIAGNOSIS — I110 Hypertensive heart disease with heart failure: Secondary | ICD-10-CM | POA: Insufficient documentation

## 2018-04-26 DIAGNOSIS — Z885 Allergy status to narcotic agent status: Secondary | ICD-10-CM | POA: Diagnosis not present

## 2018-04-26 DIAGNOSIS — R079 Chest pain, unspecified: Secondary | ICD-10-CM | POA: Diagnosis not present

## 2018-04-26 DIAGNOSIS — Q031 Atresia of foramina of Magendie and Luschka: Secondary | ICD-10-CM | POA: Insufficient documentation

## 2018-04-26 DIAGNOSIS — E782 Mixed hyperlipidemia: Secondary | ICD-10-CM | POA: Insufficient documentation

## 2018-04-26 DIAGNOSIS — E538 Deficiency of other specified B group vitamins: Secondary | ICD-10-CM | POA: Insufficient documentation

## 2018-04-26 DIAGNOSIS — Z880 Allergy status to penicillin: Secondary | ICD-10-CM | POA: Insufficient documentation

## 2018-04-26 DIAGNOSIS — G43909 Migraine, unspecified, not intractable, without status migrainosus: Secondary | ICD-10-CM | POA: Diagnosis not present

## 2018-04-26 DIAGNOSIS — K219 Gastro-esophageal reflux disease without esophagitis: Secondary | ICD-10-CM | POA: Insufficient documentation

## 2018-04-26 DIAGNOSIS — F411 Generalized anxiety disorder: Secondary | ICD-10-CM | POA: Diagnosis not present

## 2018-04-26 DIAGNOSIS — Z882 Allergy status to sulfonamides status: Secondary | ICD-10-CM | POA: Insufficient documentation

## 2018-04-26 DIAGNOSIS — G894 Chronic pain syndrome: Secondary | ICD-10-CM | POA: Diagnosis not present

## 2018-04-26 DIAGNOSIS — R9431 Abnormal electrocardiogram [ECG] [EKG]: Secondary | ICD-10-CM | POA: Insufficient documentation

## 2018-04-26 DIAGNOSIS — R0789 Other chest pain: Secondary | ICD-10-CM | POA: Diagnosis not present

## 2018-04-26 DIAGNOSIS — F3340 Major depressive disorder, recurrent, in remission, unspecified: Secondary | ICD-10-CM | POA: Insufficient documentation

## 2018-04-26 DIAGNOSIS — M329 Systemic lupus erythematosus, unspecified: Secondary | ICD-10-CM | POA: Insufficient documentation

## 2018-04-26 DIAGNOSIS — I5022 Chronic systolic (congestive) heart failure: Secondary | ICD-10-CM | POA: Diagnosis not present

## 2018-04-26 DIAGNOSIS — E559 Vitamin D deficiency, unspecified: Secondary | ICD-10-CM | POA: Insufficient documentation

## 2018-04-26 DIAGNOSIS — Z982 Presence of cerebrospinal fluid drainage device: Secondary | ICD-10-CM | POA: Insufficient documentation

## 2018-04-26 DIAGNOSIS — J45909 Unspecified asthma, uncomplicated: Secondary | ICD-10-CM | POA: Diagnosis not present

## 2018-04-26 DIAGNOSIS — R5382 Chronic fatigue, unspecified: Secondary | ICD-10-CM | POA: Diagnosis not present

## 2018-04-26 DIAGNOSIS — G40909 Epilepsy, unspecified, not intractable, without status epilepticus: Secondary | ICD-10-CM | POA: Insufficient documentation

## 2018-04-26 DIAGNOSIS — R7303 Prediabetes: Secondary | ICD-10-CM | POA: Diagnosis not present

## 2018-04-26 DIAGNOSIS — G919 Hydrocephalus, unspecified: Secondary | ICD-10-CM | POA: Insufficient documentation

## 2018-04-26 HISTORY — PX: LEFT HEART CATH AND CORONARY ANGIOGRAPHY: CATH118249

## 2018-04-26 SURGERY — LEFT HEART CATH AND CORONARY ANGIOGRAPHY
Anesthesia: LOCAL

## 2018-04-26 MED ORDER — SODIUM CHLORIDE 0.9% FLUSH: 3.0000 mL | Freq: Two times a day (BID) | INTRAVENOUS | Status: DC

## 2018-04-26 MED ORDER — SODIUM CHLORIDE 0.9% FLUSH: 3.0000 mL | INTRAVENOUS | Status: DC | PRN

## 2018-04-26 MED ORDER — IOPAMIDOL (ISOVUE-370) INJECTION 76%
INTRAVENOUS | Status: AC
  Filled 2018-04-26: qty 100

## 2018-04-26 MED ORDER — ACETAMINOPHEN 325 MG PO TABS
650.0000 mg | ORAL_TABLET | ORAL | Status: DC | PRN
  Administered 2018-04-26: 650 mg via ORAL
  Filled 2018-04-26: qty 2

## 2018-04-26 MED ORDER — NITROGLYCERIN 1 MG/10 ML FOR IR/CATH LAB
INTRA_ARTERIAL | Status: AC
  Filled 2018-04-26: qty 10

## 2018-04-26 MED ORDER — VERAPAMIL HCL 2.5 MG/ML IV SOLN
INTRAVENOUS | Status: AC
  Filled 2018-04-26: qty 2

## 2018-04-26 MED ORDER — SODIUM CHLORIDE 0.9 % WEIGHT BASED INFUSION
3.0000 mL/kg/h | INTRAVENOUS | Status: DC
  Administered 2018-04-26: 3 mL/kg/h via INTRAVENOUS

## 2018-04-26 MED ORDER — LIDOCAINE HCL (PF) 1 % IJ SOLN
INTRAMUSCULAR | Status: DC | PRN
  Administered 2018-04-26: 2 mL

## 2018-04-26 MED ORDER — VERAPAMIL HCL 2.5 MG/ML IV SOLN
INTRAVENOUS | Status: DC | PRN
  Administered 2018-04-26: 10 mL via INTRA_ARTERIAL

## 2018-04-26 MED ORDER — ASPIRIN 81 MG PO CHEW: 81.0000 mg | CHEWABLE_TABLET | ORAL | Status: DC

## 2018-04-26 MED ORDER — LIDOCAINE HCL (PF) 1 % IJ SOLN
INTRAMUSCULAR | Status: AC
  Filled 2018-04-26: qty 30

## 2018-04-26 MED ORDER — FENTANYL CITRATE (PF) 100 MCG/2ML IJ SOLN
INTRAMUSCULAR | Status: DC | PRN
  Administered 2018-04-26: 25 ug via INTRAVENOUS

## 2018-04-26 MED ORDER — MIDAZOLAM HCL 2 MG/2ML IJ SOLN
INTRAMUSCULAR | Status: AC
  Filled 2018-04-26: qty 2

## 2018-04-26 MED ORDER — MIDAZOLAM HCL 2 MG/2ML IJ SOLN
INTRAMUSCULAR | Status: DC | PRN
  Administered 2018-04-26: 1 mg via INTRAVENOUS

## 2018-04-26 MED ORDER — HEPARIN (PORCINE) IN NACL 1000-0.9 UT/500ML-% IV SOLN
INTRAVENOUS | Status: AC
  Filled 2018-04-26: qty 1000

## 2018-04-26 MED ORDER — SODIUM CHLORIDE 0.9 % WEIGHT BASED INFUSION: 1.0000 mL/kg/h | INTRAVENOUS | Status: DC

## 2018-04-26 MED ORDER — HEPARIN SODIUM (PORCINE) 1000 UNIT/ML IJ SOLN
INTRAMUSCULAR | Status: AC
  Filled 2018-04-26: qty 1

## 2018-04-26 MED ORDER — IOPAMIDOL (ISOVUE-370) INJECTION 76%
INTRAVENOUS | Status: DC | PRN
  Administered 2018-04-26: 45 mL via INTRA_ARTERIAL

## 2018-04-26 MED ORDER — SODIUM CHLORIDE 0.9 % IV SOLN: 250.0000 mL | INTRAVENOUS | Status: DC | PRN

## 2018-04-26 MED ORDER — FENTANYL CITRATE (PF) 100 MCG/2ML IJ SOLN
INTRAMUSCULAR | Status: AC
  Filled 2018-04-26: qty 2

## 2018-04-26 MED ORDER — ONDANSETRON HCL 4 MG/2ML IJ SOLN: 4.0000 mg | Freq: Four times a day (QID) | INTRAMUSCULAR | Status: DC | PRN

## 2018-04-26 MED ORDER — HEPARIN SODIUM (PORCINE) 1000 UNIT/ML IJ SOLN
INTRAMUSCULAR | Status: DC | PRN
  Administered 2018-04-26: 5000 [IU] via INTRAVENOUS

## 2018-04-26 MED ORDER — HEPARIN (PORCINE) IN NACL 1000-0.9 UT/500ML-% IV SOLN
INTRAVENOUS | Status: DC | PRN
  Administered 2018-04-26 (×2): 500 mL

## 2018-04-26 MED ORDER — SODIUM CHLORIDE 0.9 % IV SOLN
250.0000 mL | INTRAVENOUS | Status: DC | PRN
Start: 2018-04-26 — End: 2018-04-26

## 2018-04-26 MED ORDER — SODIUM CHLORIDE 0.9 % IV SOLN: INTRAVENOUS | Status: DC

## 2018-04-26 SURGICAL SUPPLY — 12 items
CATH INFINITI 5 FR JL3.5 (CATHETERS) ×2 IMPLANT
CATH INFINITI JR4 5F (CATHETERS) ×2 IMPLANT
CATH OPTITORQUE TIG 4.0 5F (CATHETERS) ×2 IMPLANT
DEVICE RAD COMP TR BAND LRG (VASCULAR PRODUCTS) ×2 IMPLANT
GLIDESHEATH SLEND SS 6F .021 (SHEATH) ×2 IMPLANT
GUIDEWIRE INQWIRE 1.5J.035X260 (WIRE) ×1 IMPLANT
INQWIRE 1.5J .035X260CM (WIRE) ×2
KIT HEART LEFT (KITS) ×2 IMPLANT
PACK CARDIAC CATHETERIZATION (CUSTOM PROCEDURE TRAY) ×2 IMPLANT
TRANSDUCER W/STOPCOCK (MISCELLANEOUS) ×2 IMPLANT
TUBING CIL FLEX 10 FLL-RA (TUBING) ×2 IMPLANT
WIRE HI TORQ VERSACORE-J 145CM (WIRE) ×2 IMPLANT

## 2018-04-26 NOTE — Brief Op Note (Signed)
BRIEF CARDIAC CATHETERIZATION NOTE  04/26/2018  1:26 PM  PATIENT:  [name]  47 y.o. female  PRE-OPERATIVE DIAGNOSIS:  cp  POST-OPERATIVE DIAGNOSIS:  * No post-op diagnosis entered *  PROCEDURE:  Procedure(s): LEFT HEART CATH AND CORONARY ANGIOGRAPHY (N/A)  SURGEON:  Surgeon(s) and Role:    * Hanish Laraia, Harrell Gave, MD - Primary  FINDINGS: 1. No angiographically significant CAD. 2. Normal LVEF with mildly elevated LVEDP.  RECOMMENDATIONS: 1. Continue medical therapy and primary prevention.  Nelva Bush, MD [facility] Pager: [phone]

## 2018-04-26 NOTE — Interval H&P Note (Signed)
History and Physical Interval Note:  04/26/2018 12:49 PM  [name]  has presented today for cardiac catheterization, with the diagnosis of chest pain. The various methods of treatment have been discussed with the patient and family. After consideration of risks, benefits and other options for treatment, the patient has consented to  Procedure(s): LEFT HEART CATH AND CORONARY ANGIOGRAPHY (N/A) as a surgical intervention .  The patient's history has been reviewed, patient examined, no change in status, stable for surgery.  I have reviewed the patient's chart and labs.  Questions were answered to the patient's satisfaction.    Cath Lab Visit (complete for each Cath Lab visit)  Clinical Evaluation Leading to the Procedure:   ACS: No.  Non-ACS:    Anginal Classification: CCS IV  Anti-ischemic medical therapy: Minimal Therapy (1 class of medications)  Non-Invasive Test Results: No non-invasive testing performed  Prior CABG: No previous CABG  [name]

## 2018-04-26 NOTE — Discharge Instructions (Signed)
**Note Ramzi Brathwaite-identified via Obfuscation** Radial Site Care °Refer to this sheet in the next few weeks. These instructions provide you with information about caring for yourself after your procedure. Your health care provider may also give you more specific instructions. Your treatment has been planned according to current medical practices, but problems sometimes occur. Call your health care provider if you have any problems or questions after your procedure. °What can I expect after the procedure? °After your procedure, it is typical to have the following: °· Bruising at the radial site that usually fades within 1-2 weeks. °· Blood collecting in the tissue (hematoma) that may be painful to the touch. It should usually decrease in size and tenderness within 1-2 weeks. ° °Follow these instructions at home: °· Take medicines only as directed by your health care provider. °· You may shower 24-48 hours after the procedure or as directed by your health care provider. Remove the bandage (dressing) and gently wash the site with plain soap and water. Pat the area dry with a clean towel. Do not rub the site, because this may cause bleeding. °· Do not take baths, swim, or use a hot tub until your health care provider approves. °· Check your insertion site every day for redness, swelling, or drainage. °· Do not apply powder or lotion to the site. °· Do not flex or bend the affected arm for 24 hours or as directed by your health care provider. °· Do not push or pull heavy objects with the affected arm for 24 hours or as directed by your health care provider. °· Do not lift over 10 lb (4.5 kg) for 5 days after your procedure or as directed by your health care provider. °· Ask your health care provider when it is okay to: °? Return to work or school. °? Resume usual physical activities or sports. °? Resume sexual activity. °· Do not drive home if you are discharged the same day as the procedure. Have someone else drive you. °· You may drive 24 hours after the procedure  unless otherwise instructed by your health care provider. °· Do not operate machinery or power tools for 24 hours after the procedure. °· If your procedure was done as an outpatient procedure, which means that you went home the same day as your procedure, a responsible adult should be with you for the first 24 hours after you arrive home. °· Keep all follow-up visits as directed by your health care provider. This is important. °Contact a health care provider if: °· You have a fever. °· You have chills. °· You have increased bleeding from the radial site. Hold pressure on the site. °Get help right away if: °· You have unusual pain at the radial site. °· You have redness, warmth, or swelling at the radial site. °· You have drainage (other than a small amount of blood on the dressing) from the radial site. °· The radial site is bleeding, and the bleeding does not stop after 30 minutes of holding steady pressure on the site. °· Your arm or hand becomes pale, cool, tingly, or numb. °This information is not intended to replace advice given to you by your health care provider. Make sure you discuss any questions you have with your health care provider. °Document Released: 10/07/2010 Document Revised: 02/10/2016 Document Reviewed: 03/23/2014 °Elsevier Interactive Patient Education © 2018 Elsevier Inc. ° °

## 2018-04-26 NOTE — Progress Notes (Signed)
TRB removed and dressing applied .  Pt continues to complain of pain in right wrist.  Site looks intact, no redness, swelling or bleeding noted. Dr. Saunders Revel notified.

## 2018-04-29 ENCOUNTER — Telehealth: Payer: Self-pay | Admitting: *Deleted

## 2018-04-29 ENCOUNTER — Encounter (HOSPITAL_COMMUNITY): Payer: Self-pay | Admitting: Internal Medicine

## 2018-04-29 NOTE — Telephone Encounter (Signed)
I sent pt email that on 8/8 that I contacted Dr. Hillery Hunter office [phone] and they ask that I send in clinical info. Pt wants to be tested for leukemia because of a rash and severe fatigue. Her cbc is normal and pathology smear is normal. I faxed info to [phone].  I called back to office today to make sure they go the clinical and will they arrange and appt. I had to leave a message for someone to call me back.

## 2018-04-30 DIAGNOSIS — K915 Postcholecystectomy syndrome: Secondary | ICD-10-CM | POA: Insufficient documentation

## 2018-04-30 DIAGNOSIS — G4452 New daily persistent headache (NDPH): Secondary | ICD-10-CM | POA: Diagnosis not present

## 2018-04-30 HISTORY — DX: Postcholecystectomy syndrome: K91.5

## 2018-05-03 ENCOUNTER — Telehealth: Payer: Self-pay | Admitting: *Deleted

## 2018-05-03 NOTE — Telephone Encounter (Signed)
[name] called from Dr. Tomasa Hosteller office. She states that she has called pt . Several times and left messages but no rtn call. I told her that I will send a message to her on my chart and see if I can get her to respond. A few minutes later [name] called me back and said she got her on the phone and the patient states she is not coming to the appt and does not want one. She states that she asked to have neurology appt. [name] told her that are you the patient that feels like you have leukemia and that you have relatives that have leukemia. Patient states she does have family that has leukemia but she does not want the appt. [name] told the pt that she would come  For labs and then depending on lab then they will make next step and she said no again. I told [name] for calling and I will enter this note.

## 2018-05-15 ENCOUNTER — Other Ambulatory Visit: Payer: Self-pay | Admitting: Specialist

## 2018-05-15 DIAGNOSIS — R918 Other nonspecific abnormal finding of lung field: Secondary | ICD-10-CM

## 2018-05-21 ENCOUNTER — Ambulatory Visit: Payer: PPO

## 2018-05-24 ENCOUNTER — Inpatient Hospital Stay: Payer: PPO | Attending: Oncology

## 2018-05-24 DIAGNOSIS — E538 Deficiency of other specified B group vitamins: Secondary | ICD-10-CM | POA: Diagnosis not present

## 2018-05-24 MED ORDER — CYANOCOBALAMIN 1000 MCG/ML IJ SOLN
1000.0000 ug | INTRAMUSCULAR | Status: DC
  Administered 2018-05-24: 1000 ug via INTRAMUSCULAR

## 2018-05-25 NOTE — Progress Notes (Signed)
Cardiology Office Note:    Date:  05/27/2018   ID:  [name], DOB Feb 22, 1971, MRN [id number]  PCP:  Algis Greenhouse, MD  Cardiologist:  Shirlee More, MD    Referring MD: Algis Greenhouse, MD    ASSESSMENT:    1. Chest pain in adult   2. Benign hypertension   3. Hyperlipidemia, mixed    PLAN:    In order of problems listed above:  1. She continues to have atypical angina stress related relieved with nitroglycerin.  She has normal coronary arteriography in order to optimize therapy for what I presume to be coronary vasospasm she will discontinue beta-blocker discontinue her ARB and start calcium channel blocker.  I strongly encouraged her to find strategies to deal with stress in her life 2. Stable see change of medicines above for presumed coronary vasospasm 3. Stable continue her statin   Next appointment: 6 months    Medication Adjustments/Labs and Tests Ordered: Current medicines are reviewed at length with the patient today.  Concerns regarding medicines are outlined above.  No orders of the defined types were placed in this encounter.  Meds ordered this encounter  Medications  . amLODipine (NORVASC) 5 MG tablet    Sig: Take 1 tablet (5 mg total) by mouth daily.    Dispense:  30 tablet    Refill:  3    Chief Complaint  Patient presents with  . Follow-up    after cardiac cath    History of Present Illness:    [name] is a 47 y.o. 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 Left heart cath was normal 04/26/18. Compliance with diet, lifestyle and medications: Yes  She continues to have episodes of typical angina nonexertional relieved with nitroglycerin perhaps once a week to every other week she has a great deal of marital and personal stress in her life but seems to be a precipitant to optimize treatment for presumed coronary vasospasm she  will discontinue beta-blocker discontinue ARB diuretic switch to calcium channel blocker and also stop aspirin with bothersome bruising.  I reviewed her coronary angiography with her and offered her reassurance Past Medical History:  Diagnosis Date  . Acute pain of left knee 12/06/2017  . Acute sinusitis, unspecified 08/29/2015  . Allergic rhinitis 11/25/2015  . Allergic state 10/29/2017   Overview:  Seasonal  . ANA positive 01/04/2016  . Anxiety   . Arthritis   . Asthma   . B12 deficiency 03/05/2018  . Benign hypertension 10/25/2015  . Chronic fatigue 11/13/2017  . Chronic joint pain 12/02/2015  . Chronic pain syndrome 01/11/2016  . 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 Connective tissue disease ([place]) 01/04/2016  . Convulsions ([place]) 03/05/2014  . 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 Depression   . Dizziness 03/05/2014  . Epilepsy ([place]) 04/10/2016  . Fibroadenoma of left breast 02/12/2014  . Generalized abdominal pain 08/28/2015  . Generalized anxiety disorder 10/27/2015  . GERD (gastroesophageal reflux disease)   . H. pylori infection 2015  . Herpes genitalia   . Hydrocephalus 08/29/2015  . Hyperlipidemia   . Hyperlipidemia, mixed 05/28/2016  . Hypertension   . Increased frequency of urination 09/03/2017  . Internal derangement of right knee 11/25/2015   Overview:  2017  . Lupus ([place])    Dr. Lucky Cowboy informed pt she does not have lupus  . Mastalgia 01/27/2014  . Migraine  without status migrainosus, not intractable 11/25/2015  . Osteoarthritis of both knees 01/11/2016  . Personal history of healed traumatic fracture 09/15/2015  . Plantar fasciitis 11/07/2016  . Prediabetes 05/28/2016   Overview:  2018: 116/5.8  . Rectal bleeding 11/17/2014  . Recurrent major depressive disorder, in remission ([place]) 10/27/2015   Overview:  2017: Situational  . Right lower quadrant abdominal pain 06/29/2017  . 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 Screening for breast cancer 11/16/2016  . Seizures ([place])    last  seizures 4-5 years ago.  . Subdural hematoma [facility]) July 2017   Bilateral  . Thyroid function test abnormal 12/06/2017   2019: TSH =7.3  . Tightness of heel cord, left 11/07/2016  . Vitamin D deficiency 06/29/2015  . Weight gain 08/28/2015    Past Surgical History:  Procedure Laterality Date  . ABDOMINAL HYSTERECTOMY  2008  . BRAIN SURGERY  02/2016   removal of 2 blood clotts from the brain.  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 BREAST EXCISIONAL BIOPSY Left 01/2014   lymph node  . BREAST SURGERY Left 02/04/14   left core bx identifying a fibroadenoma and excision of left axillary lymph node, benign  . BURR HOLE FOR SUBDURAL HEMATOMA  March 18, 2016  . CHOLECYSTECTOMY N/A 04/09/2017   Procedure: LAPAROSCOPIC CHOLECYSTECTOMY WITH INTRAOPERATIVE CHOLANGIOGRAM;  Surgeon: Robert Bellow, MD;  Location: [facility] ORS;  Service: General;  Laterality: N/A;  . COLONOSCOPY  12/21/2015  . COLONOSCOPY W/ BIOPSIES  12/08/2016   Tubular adenoma the sigmoid. No dysplasia. Benign colonic biopsies without evidence of colitis. [name], [name] [facility]  . FRACTURE SURGERY Right 2005   hand  . LAPAROSCOPIC REVISION VENTRICULAR-PERITONEAL (V-P) SHUNT  2000  . LEFT HEART CATH AND CORONARY ANGIOGRAPHY N/A 04/26/2018   Procedure: LEFT HEART CATH AND CORONARY ANGIOGRAPHY;  Surgeon: Nelva Bush, MD;  Location: [facility];  Service: Cardiovascular;  Laterality: N/A;  . LEG SURGERY Left 2002  . NECK SURGERY  1985  . POSTERIOR LAMINECTOMY THORACIC AND LUMBAR SPINE Bilateral 1985   Scoliosis stabilization  . SHUNT REVISION  2003 & July 2017  . SPINE SURGERY  1985   Scoliosis  . TUBAL LIGATION  1995  . UPPER GI ENDOSCOPY  12/08/2016   Hypertrophic gastric polyp, mild chronic gastritis. No evidence of H. pylori. [name], [name], [facility]    Current Medications: Current Meds  Medication Sig  . albuterol (PROVENTIL HFA;VENTOLIN HFA) 108 (90  Base) MCG/ACT inhaler Inhale 2 puffs into the lungs every 6 (six) hours as needed. (Patient taking differently: Inhale 2 puffs into the lungs every 6 (six) hours as needed for wheezing or shortness of breath. )  . atorvastatin (LIPITOR) 40 MG tablet Take 40 mg by mouth daily.   [name] buPROPion (WELLBUTRIN SR) 100 MG 12 hr tablet Take 100 mg by mouth daily.   . cetirizine (ZYRTEC) 10 MG tablet Take 10 mg by mouth daily.   . citalopram (CELEXA) 20 MG tablet Take 20 mg by mouth daily.   . cyanocobalamin (,VITAMIN B-12,) 1000 MCG/ML injection Inject 1,000 mcg into the muscle every 30 (thirty) days.  [name] dicyclomine (BENTYL) 20 MG tablet Take 1 tablet (20 mg total) by mouth 3 (three) times daily as needed for spasms.  . montelukast (SINGULAIR) 10 MG tablet TAKE ONE TABLET BY MOUTH AT BEDTIME  . nitroGLYCERIN (NITROSTAT) 0.4 MG SL tablet Place 1 tablet (0.4 mg total) under the tongue every 5 (five)  minutes as needed for chest pain.  . pantoprazole (PROTONIX) 40 MG tablet TAKE ONE TABLET BY MOUTH ONCE DAILY (Patient taking differently: Take 40 mg by mouth daily as needed (for acid reflux). )  . ranitidine (ZANTAC) 300 MG tablet Take 1 tablet (300 mg total) by mouth at bedtime. (Patient taking differently: Take 300 mg by mouth daily as needed for heartburn. )  . rizatriptan (MAXALT) 10 MG tablet Take 10 mg by mouth daily as needed for migraine.   . topiramate (TOPAMAX) 100 MG tablet Take 100 mg by mouth 2 (two) times daily.   . valACYclovir (VALTREX) 500 MG tablet Take 500 mg by mouth daily.   . [DISCONTINUED] aspirin 81 MG tablet Take 1 tablet (81 mg total) by mouth daily.  . [DISCONTINUED] losartan-hydrochlorothiazide (HYZAAR) 50-12.5 MG tablet Take one tablet once daily (Patient taking differently: Take 1 tablet by mouth every morning. )  . [DISCONTINUED] metoprolol tartrate (LOPRESSOR) 25 MG tablet Take 1 tablet (25 mg total) by mouth 2 (two) times daily.     Allergies:   Doxycycline hyclate; Levofloxacin;  Morphine; Penicillins; Sulfa antibiotics; Tramadol; Sumatriptan; Clindamycin; Codeine; Diclofenac potassium; and Adhesive [tape]   Social History   Socioeconomic History  . Marital status: Married    Spouse name: Not on file  . Number of children: Not on file  . Years of education: Not on file  . Highest education level: Not on file  Occupational History  . Not on file  Social Needs  . Financial resource strain: Not on file  . Food insecurity:    Worry: Not on file    Inability: Not on file  . Transportation needs:    Medical: Not on file    Non-medical: Not on file  Tobacco Use  . Smoking status: Never Smoker  . Smokeless tobacco: Never Used  Substance and Sexual Activity  . Alcohol use: No  . Drug use: No  . Sexual activity: Not Currently  Lifestyle  . Physical activity:    Days per week: 5 days    Minutes per session: 30 min  . Stress: Not at all  Relationships  . Social connections:    Talks on phone: Not on file    Gets together: Not on file    Attends religious service: Not on file    Active member of club or organization: Not on file    Attends meetings of clubs or organizations: Not on file    Relationship status: Not on file  Other Topics Concern  . Not on file  Social History Narrative  . Not on file     Family History: The patient's family history includes Brain cancer in her sister; Colon polyps in her mother; Leukemia in her maternal aunt; Leukemia (age of onset: 30) in her mother; Lung cancer in her father. ROS:   Please see the history of present illness.    All other systems reviewed and are negative.  EKGs/Labs/Other Studies Reviewed:    The following studies were reviewed today:   Recent Labs: 03/19/2018: ALT 21 04/23/2018: Hemoglobin 12.7; Platelets 246 04/24/2018: BUN 7; Creatinine, Ser 0.97; Potassium 4.1; Sodium 145  Recent Lipid Panel No results found for: CHOL, TRIG, HDL, CHOLHDL, VLDL, LDLCALC, LDLDIRECT  Physical Exam:    VS:  BP  110/78 (BP Location: Right Arm, Patient Position: Sitting, Cuff Size: Large)   Pulse 64   Ht 5\' 3"  (1.6 m)   Wt 224 lb 12.8 oz (102 kg)   SpO2 98%  BMI 39.82 kg/m     Wt Readings from Last 3 Encounters:  05/27/18 224 lb 12.8 oz (102 kg)  04/26/18 221 lb (100.2 kg)  04/24/18 221 lb (100.2 kg)     GEN:  Well nourished, well developed in no acute distress HEENT: Normal NECK: No JVD; No carotid bruits LYMPHATICS: No lymphadenopathy CARDIAC: RRR, no murmurs, rubs, gallops RESPIRATORY:  Clear to auscultation without rales, wheezing or rhonchi  ABDOMEN: Soft, non-tender, non-distended MUSCULOSKELETAL:  No edema; No deformity  SKIN: Warm and dry NEUROLOGIC:  Alert and oriented x 3 PSYCHIATRIC:  Normal affect    Signed, Shirlee More, MD  05/27/2018 11:27 AM    [place]

## 2018-05-27 ENCOUNTER — Ambulatory Visit: Payer: PPO | Admitting: Cardiology

## 2018-05-27 VITALS — BP 110/78 | HR 64 | Ht 63.0 in | Wt 224.8 lb

## 2018-05-27 DIAGNOSIS — E782 Mixed hyperlipidemia: Secondary | ICD-10-CM

## 2018-05-27 DIAGNOSIS — R079 Chest pain, unspecified: Secondary | ICD-10-CM

## 2018-05-27 DIAGNOSIS — I10 Essential (primary) hypertension: Secondary | ICD-10-CM | POA: Diagnosis not present

## 2018-05-27 MED ORDER — AMLODIPINE BESYLATE 5 MG PO TABS: 5.0000 mg | ORAL_TABLET | Freq: Every day | ORAL | 3 refills | Status: DC

## 2018-05-27 NOTE — Patient Instructions (Signed)
Medication Instructions:  Your physician has recommended you make the following change in your medication:   STOP aspirin STOP losartan-hydrochlorothiazide STOP metoprolol  START amlodipine (norvasc) 5 mg daily   Labwork: None  Testing/Procedures: None  Follow-Up: Your physician wants you to follow-up in: 6 months. You will receive a reminder letter in the mail two months in advance. If you don't receive a letter, please call our office to schedule the follow-up appointment.   If you need a refill on your cardiac medications before your next appointment, please call your pharmacy.   Thank you for choosing [facility]! Robyne Peers, RN [phone]   Amlodipine tablets What is this medicine? AMLODIPINE (am LOE di peen) is a calcium-channel blocker. It affects the amount of calcium found in your heart and muscle cells. This relaxes your blood vessels, which can reduce the amount of work the heart has to do. This medicine is used to lower high blood pressure. It is also used to prevent chest pain. This medicine may be used for other purposes; ask your health care provider or pharmacist if you have questions. COMMON BRAND NAME(S): Norvasc What should I tell my health care provider before I take this medicine? They need to know if you have any of these conditions: -heart problems like heart failure or aortic stenosis -liver disease -an unusual or allergic reaction to amlodipine, other medicines, foods, dyes, or preservatives -pregnant or trying to get pregnant -breast-feeding How should I use this medicine? Take this medicine by mouth with a glass of water. Follow the directions on the prescription label. Take your medicine at regular intervals. Do not take more medicine than directed. Talk to your pediatrician regarding the use of this medicine in children. Special care may be needed. This medicine has been used in children as young as 6. Persons over 57 years old may  have a stronger reaction to this medicine and need smaller doses. Overdosage: If you think you have taken too much of this medicine contact a poison control center or emergency room at once. NOTE: This medicine is only for you. Do not share this medicine with others. What if I miss a dose? If you miss a dose, take it as soon as you can. If it is almost time for your next dose, take only that dose. Do not take double or extra doses. What may interact with this medicine? -herbal or dietary supplements -local or general anesthetics -medicines for high blood pressure -medicines for prostate problems -rifampin This list may not describe all possible interactions. Give your health care provider a list of all the medicines, herbs, non-prescription drugs, or dietary supplements you use. Also tell them if you smoke, drink alcohol, or use illegal drugs. Some items may interact with your medicine. What should I watch for while using this medicine? Visit your doctor or health care professional for regular check ups. Check your blood pressure and pulse rate regularly. Ask your health care professional what your blood pressure and pulse rate should be, and when you should contact him or her. This medicine may make you feel confused, dizzy or lightheaded. Do not drive, use machinery, or do anything that needs mental alertness until you know how this medicine affects you. To reduce the risk of dizzy or fainting spells, do not sit or stand up quickly, especially if you are an older patient. Avoid alcoholic drinks; they can make you more dizzy. Do not suddenly stop taking amlodipine. Ask your doctor or health care professional how  you can gradually reduce the dose. What side effects may I notice from receiving this medicine? Side effects that you should report to your doctor or health care professional as soon as possible: -allergic reactions like skin rash, itching or hives, swelling of the face, lips, or  tongue -breathing problems -changes in vision or hearing -chest pain -fast, irregular heartbeat -swelling of legs or ankles Side effects that usually do not require medical attention (report to your doctor or health care professional if they continue or are bothersome): -dry mouth -facial flushing -nausea, vomiting -stomach gas, pain -tired, weak -trouble sleeping This list may not describe all possible side effects. Call your doctor for medical advice about side effects. You may report side effects to FDA at 1-800-FDA-1088. Where should I keep my medicine? Keep out of the reach of children. Store at room temperature between 59 and 86 degrees F (15 and 30 degrees C). Protect from light. Keep container tightly closed. Throw away any unused medicine after the expiration date. NOTE: This sheet is a summary. It may not cover all possible information. If you have questions about this medicine, talk to your doctor, pharmacist, or health care provider.  2018 Elsevier/Gold Standard (2012-08-02 11:40:58)

## 2018-06-10 DIAGNOSIS — N939 Abnormal uterine and vaginal bleeding, unspecified: Secondary | ICD-10-CM

## 2018-06-10 DIAGNOSIS — R7303 Prediabetes: Secondary | ICD-10-CM | POA: Diagnosis not present

## 2018-06-10 DIAGNOSIS — I10 Essential (primary) hypertension: Secondary | ICD-10-CM | POA: Diagnosis not present

## 2018-06-10 DIAGNOSIS — F411 Generalized anxiety disorder: Secondary | ICD-10-CM | POA: Diagnosis not present

## 2018-06-10 DIAGNOSIS — E782 Mixed hyperlipidemia: Secondary | ICD-10-CM | POA: Diagnosis not present

## 2018-06-10 DIAGNOSIS — F3340 Major depressive disorder, recurrent, in remission, unspecified: Secondary | ICD-10-CM | POA: Diagnosis not present

## 2018-06-10 DIAGNOSIS — R7989 Other specified abnormal findings of blood chemistry: Secondary | ICD-10-CM | POA: Diagnosis not present

## 2018-06-10 HISTORY — DX: Abnormal uterine and vaginal bleeding, unspecified: N93.9

## 2018-06-17 DIAGNOSIS — N939 Abnormal uterine and vaginal bleeding, unspecified: Secondary | ICD-10-CM | POA: Diagnosis not present

## 2018-06-17 DIAGNOSIS — N898 Other specified noninflammatory disorders of vagina: Secondary | ICD-10-CM | POA: Diagnosis not present

## 2018-06-17 DIAGNOSIS — R3911 Hesitancy of micturition: Secondary | ICD-10-CM | POA: Diagnosis not present

## 2018-06-18 ENCOUNTER — Ambulatory Visit: Payer: PPO

## 2018-06-20 ENCOUNTER — Encounter: Payer: Self-pay | Admitting: Oncology

## 2018-06-21 ENCOUNTER — Inpatient Hospital Stay: Payer: PPO | Attending: Oncology

## 2018-06-26 DIAGNOSIS — L820 Inflamed seborrheic keratosis: Secondary | ICD-10-CM | POA: Diagnosis not present

## 2018-06-26 DIAGNOSIS — L219 Seborrheic dermatitis, unspecified: Secondary | ICD-10-CM | POA: Diagnosis not present

## 2018-06-26 DIAGNOSIS — L0390 Cellulitis, unspecified: Secondary | ICD-10-CM | POA: Diagnosis not present

## 2018-07-03 ENCOUNTER — Encounter: Payer: Self-pay | Admitting: Oncology

## 2018-07-09 ENCOUNTER — Telehealth: Payer: Self-pay | Admitting: Cardiology

## 2018-07-09 ENCOUNTER — Other Ambulatory Visit: Payer: Self-pay

## 2018-07-09 DIAGNOSIS — G43009 Migraine without aura, not intractable, without status migrainosus: Secondary | ICD-10-CM | POA: Diagnosis not present

## 2018-07-09 DIAGNOSIS — Z982 Presence of cerebrospinal fluid drainage device: Secondary | ICD-10-CM | POA: Diagnosis not present

## 2018-07-09 MED ORDER — LOSARTAN POTASSIUM-HCTZ 50-12.5 MG PO TABS: 1.0000 | ORAL_TABLET | Freq: Every day | ORAL | 2 refills | Status: DC

## 2018-07-09 NOTE — Telephone Encounter (Signed)
Stop amlodipine Start telmesatin 20 mg day Check bmp 1 week

## 2018-07-09 NOTE — Telephone Encounter (Signed)
Patient reports leg and feet swelling that has progressively gotten worse over the past two days, along with some redness to her right lower leg. She wanted Dr. Bettina Gavia to be aware of this because he had asked her to call and let us know if this happened after she started amlodipine 5 mg daily on 05/27/18. Patient denies any shortness of breath or any pain in her lower extremities. Please advise.

## 2018-07-09 NOTE — Telephone Encounter (Signed)
Patient was hoping that she could instead return to her losartan-HCTZ as she was tolerating this well before and it was controlling her HTN. It was explained to the patient why the med change was made.

## 2018-07-09 NOTE — Telephone Encounter (Signed)
Patient is stating her ankles and legs are swelling after doctor put her on amlodipine 5mg . She is asking for a nurse to call her

## 2018-07-09 NOTE — Telephone Encounter (Signed)
Good idea use her losartin HCT be sure we update meds

## 2018-07-09 NOTE — Telephone Encounter (Signed)
Amlodipine was d/c. Losartan/HCTZ 50-12.5 mg was resumed. Orders have been placed. Patient will log her BP and pulse for 1 week and call the office with these numbers to ensure med change is appropriate.

## 2018-07-09 NOTE — Telephone Encounter (Signed)
Left voicemail for Lidya to call the office.

## 2018-07-16 ENCOUNTER — Ambulatory Visit: Payer: PPO

## 2018-07-16 ENCOUNTER — Ambulatory Visit: Payer: PPO | Admitting: Oncology

## 2018-07-16 ENCOUNTER — Other Ambulatory Visit: Payer: PPO

## 2018-07-19 ENCOUNTER — Ambulatory Visit: Payer: PPO | Admitting: Oncology

## 2018-07-19 ENCOUNTER — Other Ambulatory Visit: Payer: PPO

## 2018-07-19 ENCOUNTER — Ambulatory Visit: Payer: PPO

## 2018-07-24 DIAGNOSIS — E559 Vitamin D deficiency, unspecified: Secondary | ICD-10-CM | POA: Diagnosis not present

## 2018-08-14 DIAGNOSIS — I10 Essential (primary) hypertension: Secondary | ICD-10-CM | POA: Diagnosis not present

## 2018-08-14 DIAGNOSIS — E782 Mixed hyperlipidemia: Secondary | ICD-10-CM | POA: Diagnosis not present

## 2018-08-23 DIAGNOSIS — E559 Vitamin D deficiency, unspecified: Secondary | ICD-10-CM | POA: Diagnosis not present

## 2018-09-25 DIAGNOSIS — E559 Vitamin D deficiency, unspecified: Secondary | ICD-10-CM | POA: Diagnosis not present

## 2018-10-15 ENCOUNTER — Other Ambulatory Visit: Payer: Self-pay | Admitting: *Deleted

## 2018-10-15 MED ORDER — HYDROCHLOROTHIAZIDE 12.5 MG PO CAPS: 12.5000 mg | ORAL_CAPSULE | Freq: Every day | ORAL | 0 refills | Status: DC

## 2018-10-15 MED ORDER — LOSARTAN POTASSIUM 50 MG PO TABS: 50.0000 mg | ORAL_TABLET | Freq: Every day | ORAL | 0 refills | Status: DC

## 2018-10-15 NOTE — Telephone Encounter (Signed)
[facility] in [place] sent notification that losartan-hydrochlorothiazide combination pill is on backorder. Pharmacy requesting medication be sent separately. Losartan 50 mg daily and hydrochlorothiazide 12.5 mg daily sent.

## 2018-10-24 DIAGNOSIS — Z79899 Other long term (current) drug therapy: Secondary | ICD-10-CM | POA: Diagnosis not present

## 2018-10-24 DIAGNOSIS — Z880 Allergy status to penicillin: Secondary | ICD-10-CM | POA: Diagnosis not present

## 2018-10-24 DIAGNOSIS — Z882 Allergy status to sulfonamides status: Secondary | ICD-10-CM | POA: Diagnosis not present

## 2018-10-24 DIAGNOSIS — R109 Unspecified abdominal pain: Secondary | ICD-10-CM | POA: Diagnosis not present

## 2018-10-24 DIAGNOSIS — Z982 Presence of cerebrospinal fluid drainage device: Secondary | ICD-10-CM | POA: Diagnosis not present

## 2018-10-24 DIAGNOSIS — R197 Diarrhea, unspecified: Secondary | ICD-10-CM | POA: Diagnosis not present

## 2018-10-24 DIAGNOSIS — R1012 Left upper quadrant pain: Secondary | ICD-10-CM | POA: Diagnosis not present

## 2018-10-24 DIAGNOSIS — Z885 Allergy status to narcotic agent status: Secondary | ICD-10-CM | POA: Diagnosis not present

## 2018-10-24 DIAGNOSIS — I10 Essential (primary) hypertension: Secondary | ICD-10-CM | POA: Diagnosis not present

## 2018-10-24 DIAGNOSIS — E785 Hyperlipidemia, unspecified: Secondary | ICD-10-CM | POA: Diagnosis not present

## 2018-10-24 DIAGNOSIS — R51 Headache: Secondary | ICD-10-CM | POA: Diagnosis not present

## 2018-10-28 DIAGNOSIS — E559 Vitamin D deficiency, unspecified: Secondary | ICD-10-CM | POA: Diagnosis not present

## 2018-11-05 DIAGNOSIS — H524 Presbyopia: Secondary | ICD-10-CM | POA: Diagnosis not present

## 2018-11-14 ENCOUNTER — Encounter: Payer: Self-pay | Admitting: Cardiology

## 2018-11-14 ENCOUNTER — Ambulatory Visit (INDEPENDENT_AMBULATORY_CARE_PROVIDER_SITE_OTHER): Payer: PPO | Admitting: Cardiology

## 2018-11-14 VITALS — BP 112/86 | HR 79 | Ht 63.0 in | Wt 217.8 lb

## 2018-11-14 DIAGNOSIS — Z982 Presence of cerebrospinal fluid drainage device: Secondary | ICD-10-CM | POA: Diagnosis not present

## 2018-11-14 DIAGNOSIS — I10 Essential (primary) hypertension: Secondary | ICD-10-CM

## 2018-11-14 DIAGNOSIS — R0781 Pleurodynia: Secondary | ICD-10-CM

## 2018-11-14 MED ORDER — POTASSIUM CHLORIDE CRYS ER 20 MEQ PO TBCR: 20.0000 meq | EXTENDED_RELEASE_TABLET | Freq: Every day | ORAL | 1 refills | Status: DC

## 2018-11-14 NOTE — Progress Notes (Signed)
Cardiology Office Note:    Date:  11/14/2018   ID:  [name], DOB 11-Feb-1971, MRN [id number]  PCP:  Algis Greenhouse, MD  Cardiologist:  Shirlee More, MD    Referring MD: Algis Greenhouse, MD    ASSESSMENT:    1. Pleuritic chest pain   2. S/P VP shunt   3. Benign hypertension    PLAN:    In order of problems listed above:  1. Recent records reviewed from [facility] chest x-ray was normal EKG laboratory studies including CBC CMP were normal troponin sent for potassium 3.2 that was not addressed.  We will draw a d-dimer and sedimentation rate today if elevated she will need treatment and since she is sulfa allergic I put her on a short course of steroids.  Check d-dimer as a screen if elevated she will need a CTA of chest. 2. She recently is been having an unusual positional headache tells me she has not been seen by a neurosurgeon or neurology in her request I will refer her to Dr. Melene Plan neurology for follow-up for hydrocephalus shunt and subdural hematoma. 3. Stable blood pressure target continue ARB diuretic 4. Hypokalemia start potassium 20 mEq daily we will plan to recheck at her next office visit   Next appointment: 4 weeks   Medication Adjustments/Labs and Tests Ordered: Current medicines are reviewed at length with the patient today.  Concerns regarding medicines are outlined above.  Orders Placed This Encounter  Procedures  . Sedimentation rate  . D-Dimer, Quantitative  . Basic Metabolic Panel (BMET)  . Magnesium  . Ambulatory referral to Neurology   Meds ordered this encounter  Medications  . potassium chloride SA (K-DUR,KLOR-CON) 20 MEQ tablet    Sig: Take 1 tablet (20 mEq total) by mouth daily.    Dispense:  30 tablet    Refill:  1    Chief Complaint  Patient presents with  . Follow-up    for angina    History of Present Illness:    [name] is a 48 y.o. female with a hx of angina with normal coronary  arteriography,hypertension, Dandy walker syndrome, hydrocephalus with a VP shunt and chronic SDH  last seen 05/27/18. Compliance with diet, lifestyle and medications: Yes  *Not to be a very broad based visit her predominant concern is her visit to the emergency room 10/24/2018 [place] she presented with positional headache that was new abdominal pain chest pain and underwent extensive evaluation chest x-ray CT of the head unremarkable EKG sinus rhythm normal no ischemic changes laboratory studies including CBC CMP troponin normal she had a potassium of 3.2 that was not addressed.  Her concern is that she says she gets Fitbit heart rates of 48-50 at night.  I assured her that this is within the range of normal.  She tells me she has not been seen by a neurologist or neurosurgeon for many years and concerned that her headache may represent ongoing problems with her shunt her request a referral to be seen by neurology.  She was given supplements for her potassium and for further evaluation of chest pain that is clearly pleuritic without fever chills or sputum production check a d-dimer to screen for venous thromboembolism and a sedimentation rate.  I intended to start her on a nonsteroidal anti-inflammatory drug but she has sulfur allergy and if sedimentation rate is significantly elevated would need a short course of steroid.  I do not think she  needs an ischemia evaluation she has had no anginal discomfort she is not short of breath no leg swelling edema pain orthopnea palpitation or syncope. Past Medical History:  Diagnosis Date  . Acute pain of left knee 12/06/2017  . Acute sinusitis, unspecified 08/29/2015  . Allergic rhinitis 11/25/2015  . Allergic state 10/29/2017   Overview:  Seasonal  . ANA positive 01/04/2016  . Anxiety   . Arthritis   . Asthma   . B12 deficiency 03/05/2018  . Benign hypertension 10/25/2015  . Chronic fatigue 11/13/2017  . Chronic joint pain 12/02/2015  . Chronic  pain syndrome 01/11/2016  . 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 Connective tissue disease ([place]) 01/04/2016  . Convulsions ([place]) 03/05/2014  . 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 Depression   . Dizziness 03/05/2014  . Epilepsy ([place]) 04/10/2016  . Fibroadenoma of left breast 02/12/2014  . Generalized abdominal pain 08/28/2015  . Generalized anxiety disorder 10/27/2015  . GERD (gastroesophageal reflux disease)   . H. pylori infection 2015  . Herpes genitalia   . Hydrocephalus ([place]) 08/29/2015  . Hyperlipidemia   . Hyperlipidemia, mixed 05/28/2016  . Hypertension   . Increased frequency of urination 09/03/2017  . Internal derangement of right knee 11/25/2015   Overview:  2017  . Lupus ([place])    Dr. Lucky Cowboy informed pt she does not have lupus  . Mastalgia 01/27/2014  . Migraine without status migrainosus, not intractable 11/25/2015  . Osteoarthritis of both knees 01/11/2016  . Personal history of healed traumatic fracture 09/15/2015  . Plantar fasciitis 11/07/2016  . Prediabetes 05/28/2016   Overview:  2018: 116/5.8  . Rectal bleeding 11/17/2014  . Recurrent major depressive disorder, in remission ([place]) 10/27/2015   Overview:  2017: Situational  . Right lower quadrant abdominal pain 06/29/2017  . 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 Screening for breast cancer 11/16/2016  . Seizures ([place])    last seizures 4-5 years ago.  . Subdural hematoma [facility]) July 2017   Bilateral  . Thyroid function test abnormal 12/06/2017   2019: TSH =7.3  . Tightness of heel cord, left 11/07/2016  . Vitamin D deficiency 06/29/2015  . Weight gain 08/28/2015    Past Surgical History:  Procedure Laterality Date  . ABDOMINAL HYSTERECTOMY  2008  . BRAIN SURGERY  02/2016   removal of 2 blood clotts from the brain.  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 BREAST EXCISIONAL BIOPSY Left 01/2014   lymph node  . BREAST SURGERY Left 02/04/14   left core bx identifying a fibroadenoma and excision of left axillary  lymph node, benign  . BURR HOLE FOR SUBDURAL HEMATOMA  March 18, 2016  . CHOLECYSTECTOMY N/A 04/09/2017   Procedure: LAPAROSCOPIC CHOLECYSTECTOMY WITH INTRAOPERATIVE CHOLANGIOGRAM;  Surgeon: Robert Bellow, MD;  Location: [facility] ORS;  Service: General;  Laterality: N/A;  . COLONOSCOPY  12/21/2015  . COLONOSCOPY W/ BIOPSIES  12/08/2016   Tubular adenoma the sigmoid. No dysplasia. Benign colonic biopsies without evidence of colitis. [name], [name] [facility]  . FRACTURE SURGERY Right 2005   hand  . LAPAROSCOPIC REVISION VENTRICULAR-PERITONEAL (V-P) SHUNT  2000  . LEFT HEART CATH AND CORONARY ANGIOGRAPHY N/A 04/26/2018   Procedure: LEFT HEART CATH AND CORONARY ANGIOGRAPHY;  Surgeon: Nelva Bush, MD;  Location: [facility];  Service: Cardiovascular;  Laterality: N/A;  . LEG SURGERY Left 2002  . NECK SURGERY  1985  . POSTERIOR LAMINECTOMY THORACIC AND LUMBAR SPINE Bilateral 1985   Scoliosis  stabilization  . SHUNT REVISION  2003 & July 2017  . SPINE SURGERY  1985   Scoliosis  . TUBAL LIGATION  1995  . UPPER GI ENDOSCOPY  12/08/2016   Hypertrophic gastric polyp, mild chronic gastritis. No evidence of H. pylori. [name], [name], [facility]    Current Medications: Current Meds  Medication Sig  . albuterol (PROVENTIL HFA;VENTOLIN HFA) 108 (90 Base) MCG/ACT inhaler Inhale 2 puffs into the lungs every 6 (six) hours as needed.  [name] atorvastatin (LIPITOR) 40 MG tablet Take 40 mg by mouth daily.   [name] buPROPion (WELLBUTRIN SR) 100 MG 12 hr tablet Take 100 mg by mouth daily.   . cetirizine (ZYRTEC) 10 MG tablet Take 10 mg by mouth daily.   . citalopram (CELEXA) 20 MG tablet Take 20 mg by mouth daily.   . cyanocobalamin (,VITAMIN B-12,) 1000 MCG/ML injection Inject 1,000 mcg into the muscle every 30 (thirty) days.  [name] dicyclomine (BENTYL) 20 MG tablet Take 1 tablet (20 mg total) by mouth 3 (three) times daily as needed for spasms.  . hydrochlorothiazide  (MICROZIDE) 12.5 MG capsule Take 1 capsule (12.5 mg total) by mouth daily.  [name] losartan (COZAAR) 50 MG tablet Take 1 tablet (50 mg total) by mouth daily.  . montelukast (SINGULAIR) 10 MG tablet TAKE ONE TABLET BY MOUTH AT BEDTIME  . nitroGLYCERIN (NITROSTAT) 0.4 MG SL tablet Place 1 tablet (0.4 mg total) under the tongue every 5 (five) minutes as needed for chest pain.  . pantoprazole (PROTONIX) 40 MG tablet TAKE ONE TABLET BY MOUTH ONCE DAILY  . ranitidine (ZANTAC) 300 MG tablet Take 1 tablet (300 mg total) by mouth at bedtime.  . rizatriptan (MAXALT) 10 MG tablet Take 10 mg by mouth daily as needed for migraine.   . sucralfate (CARAFATE) 1 g tablet Take 1 g by mouth 4 (four) times daily as needed.  . topiramate (TOPAMAX) 100 MG tablet Take 100 mg by mouth 2 (two) times daily.   . valACYclovir (VALTREX) 500 MG tablet Take 500 mg by mouth daily.      Allergies:   Doxycycline hyclate; Levofloxacin; Morphine; Penicillins; Sulfa antibiotics; Tramadol; Sumatriptan; Clindamycin; Codeine; Diclofenac potassium; and Adhesive [tape]   Social History   Socioeconomic History  . Marital status: Married    Spouse name: Not on file  . Number of children: Not on file  . Years of education: Not on file  . Highest education level: Not on file  Occupational History  . Not on file  Social Needs  . Financial resource strain: Not on file  . Food insecurity:    Worry: Not on file    Inability: Not on file  . Transportation needs:    Medical: Not on file    Non-medical: Not on file  Tobacco Use  . Smoking status: Never Smoker  . Smokeless tobacco: Never Used  Substance and Sexual Activity  . Alcohol use: No  . Drug use: No  . Sexual activity: Not Currently  Lifestyle  . Physical activity:    Days per week: 5 days    Minutes per session: 30 min  . Stress: Not at all  Relationships  . Social connections:    Talks on phone: Not on file    Gets together: Not on file    Attends religious service:  Not on file    Active member of club or organization: Not on file    Attends meetings of clubs or organizations: Not on file  Relationship status: Not on file  Other Topics Concern  . Not on file  Social History Narrative  . Not on file     Family History: The patient's family history includes Brain cancer in her sister; Colon polyps in her mother; Leukemia in her maternal aunt; Leukemia (age of onset: 71) in her mother; Lung cancer in her father. ROS:   Please see the history of present illness.    All other systems reviewed and are negative.  EKGs/Labs/Other Studies Reviewed:    The following studies were reviewed today:  EKG:  EKG a [facility] [place] showed sinus rhythm nonspecific ST and T abnormality personally reviewed  Recent Labs:  08/14/18 CMP normal  03/19/2018: ALT 21 04/23/2018: Hemoglobin 12.7; Platelets 246 04/24/2018: BUN 7; Creatinine, Ser 0.97; Potassium 4.1; Sodium 145   Recent Lipid Panel 06/10/18 Chol 301 HDL 47 LDL 209 nonHDL 254    Physical Exam:    VS:  BP 112/86 (BP Location: Right Arm, Patient Position: Sitting, Cuff Size: Large)   Pulse 79   Ht 5\' 3"  (1.6 m)   Wt 217 lb 12.8 oz (98.8 kg)   SpO2 96%   BMI 38.58 kg/m     Wt Readings from Last 3 Encounters:  11/14/18 217 lb 12.8 oz (98.8 kg)  05/27/18 224 lb 12.8 oz (102 kg)  04/26/18 221 lb (100.2 kg)     GEN:  Well nourished, well developed in no acute distress HEENT: Normal NECK: No JVD; No carotid bruits LYMPHATICS: No lymphadenopathy CARDIAC: No chest wall tenderness RRR, no murmurs, rubs, gallops RESPIRATORY:  Clear to auscultation without rales, wheezing or rhonchi  ABDOMEN: Soft, non-tender, non-distended MUSCULOSKELETAL:  No edema; No deformity  SKIN: Warm and dry NEUROLOGIC:  Alert and oriented x 3 PSYCHIATRIC:  Normal affect    Signed, Shirlee More, MD  11/14/2018 1:51 PM    [facility]

## 2018-11-14 NOTE — Patient Instructions (Addendum)
Medication Instructions:  Your physician has recommended you make the following change in your medication:   START potassium chloride 20 mEq: Take 1 tablet daily  If you need a refill on your cardiac medications before your next appointment, please call your pharmacy.   Lab work: Your physician recommends that you return for lab work today: sedimentation rate, d-dimer, BMP, magnesium.   If you have labs (blood work) drawn today and your tests are completely normal, you will receive your results only by: [name] MyChart Message (if you have MyChart) OR . A paper copy in the mail If you have any lab test that is abnormal or we need to change your treatment, we will call you to review the results.  Testing/Procedures: You have been referred to see a neurologist, Dr. [name]. You will be contacted to schedule this appointment.   Follow-Up: At [facility], you and your health needs are our priority.  As part of our continuing mission to provide you with exceptional heart care, we have created designated Provider Care Teams.  These Care Teams include your primary Cardiologist (physician) and Advanced Practice Providers (APPs -  Physician Assistants and Nurse Practitioners) who all work together to provide you with the care you need, when you need it. You will need a follow up appointment in 4 weeks.     Potassium chloride tablets, extended-release tablets or capsules What is this medicine? POTASSIUM CHLORIDE (poe TASS i um KLOOR ide) is a potassium supplement used to prevent and to treat low potassium. Potassium is important for the heart, muscles, and nerves. Too much or too little potassium in the body can cause serious problems. This medicine may be used for other purposes; ask your health care provider or pharmacist if you have questions. COMMON BRAND NAME(S): ED-K+10, K-10, K-8, K-Dur, K-Tab, Kaon-CL, Klor-Con, Klor-Con M10, Klor-Con M15, Klor-Con M20, Klotrix, Micro-K, Micro-K Extencaps,  Slow-K What should I tell my health care provider before I take this medicine? They need to know if you have any of these conditions: -Addison's disease -dehydration -diabetes -difficulty swallowing -heart disease -high levels of potassium in the blood -irregular heartbeat -kidney disease -recent severe burn -stomach ulcers or other stomach problems -an unusual or allergic reaction to potassium, tartrazine, other medicines, foods, dyes, or preservatives -pregnant or trying to get pregnant -breast-feeding How should I use this medicine? Take this medicine by mouth with a full glass of water. Take with food. Follow the directions on the prescription label. Do not suck on, crush, or chew this medicine. If you have difficulty swallowing, ask the pharmacist how to take. Take your medicine at regular intervals. Do not take it more often than directed. Do not stop taking except on your doctor's advice. Talk to your pediatrician regarding the use of this medicine in children. Special care may be needed. Overdosage: If you think you have taken too much of this medicine contact a poison control center or emergency room at once. NOTE: This medicine is only for you. Do not share this medicine with others. What if I miss a dose? If you miss a dose, take it as soon as you can. If it is almost time for your next dose, take only that dose. Do not take double or extra doses. What may interact with this medicine? Do not take this medicine with any of the following medications: -certain diuretics such as spironolactone, triamterene -certain medicines for stomach problems like atropine; difenoxin and glycopyrrolate -eplerenone -sodium polystyrene sulfonate This medicine may also  interact with the following medications: -certain medicines for blood pressure or heart disease like lisinopril, losartan, quinapril, valsartan -medicines that lower your chance of fighting infection such as cyclosporine,  tacrolimus -NSAIDs, medicines for pain and inflammation, like ibuprofen or naproxen -other potassium supplements -salt substitutes This list may not describe all possible interactions. Give your health care provider a list of all the medicines, herbs, non-prescription drugs, or dietary supplements you use. Also tell them if you smoke, drink alcohol, or use illegal drugs. Some items may interact with your medicine. What should I watch for while using this medicine? Visit your doctor or health care professional for regular check ups. You will need lab work done regularly. You may need to be on a special diet while taking this medicine. Ask your doctor. What side effects may I notice from receiving this medicine? Side effects that you should report to your doctor or health care professional as soon as possible: -allergic reactions like skin rash, itching or hives, swelling of the face, lips, or tongue -black, tarry stools -breathing problems -confusion -heartburn -fast, irregular heartbeat -feeling faint or lightheaded, falls -low blood pressure -numbness or tingling in hands or feet -pain when swallowing -unusually weak or tired -weakness, heaviness of legs Side effects that usually do not require medical attention (report to your doctor or health care professional if they continue or are bothersome): -diarrhea -nausea, vomiting -stomach pain This list may not describe all possible side effects. Call your doctor for medical advice about side effects. You may report side effects to FDA at 1-800-FDA-1088. Where should I keep my medicine? Keep out of the reach of children. Store at room temperature between 15 and 30 degrees C (59 and 86 degrees F ). Keep bottle closed tightly to protect this medicine from light and moisture. Throw away any unused medicine after the expiration date. NOTE: This sheet is a summary. It may not cover all possible information. If you have questions about this  medicine, talk to your doctor, pharmacist, or health care provider.  2019 Elsevier/Gold Standard (2016-06-07 11:43:27)

## 2018-11-15 ENCOUNTER — Telehealth: Payer: Self-pay | Admitting: *Deleted

## 2018-11-15 DIAGNOSIS — R0602 Shortness of breath: Secondary | ICD-10-CM

## 2018-11-15 NOTE — Telephone Encounter (Signed)
-----   Message from Richardo Priest, MD sent at 11/15/2018 10:02 AM EST ----- Normal or stable result  Elevated, prednisone 20 mg tabs  40 mg day 3 days 20 mg day 3 days  10 mg day 4 days  Recheck a sed rate after prednisone is completed for pleurisy

## 2018-11-15 NOTE — Telephone Encounter (Signed)
Left message for patient to return call to discuss lab results.  

## 2018-11-16 LAB — BASIC METABOLIC PANEL
BUN/Creatinine Ratio: 8 — ABNORMAL LOW (ref 9–23)
BUN: 8 mg/dL (ref 6–24)
CALCIUM: 9.3 mg/dL (ref 8.7–10.2)
CO2: 24 mmol/L (ref 20–29)
Chloride: 106 mmol/L (ref 96–106)
Creatinine, Ser: 1.06 mg/dL — ABNORMAL HIGH (ref 0.57–1.00)
GFR calc Af Amer: 71 mL/min/{1.73_m2} (ref >59)
GFR calc non Af Amer: 61 mL/min/{1.73_m2} (ref >59)
Glucose: 96 mg/dL (ref 65–99)
POTASSIUM: 3.5 mmol/L (ref 3.5–5.2)
Sodium: 143 mmol/L (ref 134–144)

## 2018-11-16 LAB — D-DIMER, QUANTITATIVE: D-DIMER: 0.57 mg/L FEU — ABNORMAL HIGH (ref 0.00–0.49)

## 2018-11-16 LAB — SEDIMENTATION RATE: Sed Rate: 47 mm/hr — ABNORMAL HIGH (ref 0–40)

## 2018-11-16 LAB — MAGNESIUM: Magnesium: 2.1 mg/dL (ref 1.6–2.3)

## 2018-11-18 ENCOUNTER — Encounter: Payer: Self-pay | Admitting: *Deleted

## 2018-11-18 DIAGNOSIS — R7989 Other specified abnormal findings of blood chemistry: Secondary | ICD-10-CM | POA: Insufficient documentation

## 2018-11-18 HISTORY — DX: Other specified abnormal findings of blood chemistry: R79.89

## 2018-11-18 MED ORDER — PREDNISONE 20 MG PO TABS: ORAL_TABLET | ORAL | 0 refills | Status: DC

## 2018-11-18 NOTE — Addendum Note (Signed)
Addended by: Austin Miles on: 11/18/2018 08:59 AM   Modules accepted: Orders

## 2018-11-18 NOTE — Telephone Encounter (Signed)
Patient returned your call.

## 2018-11-18 NOTE — Telephone Encounter (Signed)
Patient informed of results and advised to start taking prednisone as directed by Dr. Bettina Gavia below. Patient verbalized understanding and prescription has been sent in to [facility] in [place]. Patient is agreeable to having a STAT CT chest PE due to elevated d-dimer at [place] form has been completed and given to [place] at the front desk. Precert message sent. Patient informed she would be contacted with the scheduled date and time. Patient verbalized understanding. No further questions.

## 2018-11-19 DIAGNOSIS — R918 Other nonspecific abnormal finding of lung field: Secondary | ICD-10-CM | POA: Diagnosis not present

## 2018-11-19 DIAGNOSIS — R7989 Other specified abnormal findings of blood chemistry: Secondary | ICD-10-CM | POA: Diagnosis not present

## 2018-11-27 DIAGNOSIS — I10 Essential (primary) hypertension: Secondary | ICD-10-CM | POA: Diagnosis not present

## 2018-11-27 DIAGNOSIS — Z882 Allergy status to sulfonamides status: Secondary | ICD-10-CM | POA: Diagnosis not present

## 2018-11-27 DIAGNOSIS — M542 Cervicalgia: Secondary | ICD-10-CM | POA: Diagnosis not present

## 2018-11-27 DIAGNOSIS — Z79899 Other long term (current) drug therapy: Secondary | ICD-10-CM | POA: Diagnosis not present

## 2018-11-27 DIAGNOSIS — E785 Hyperlipidemia, unspecified: Secondary | ICD-10-CM | POA: Diagnosis not present

## 2018-11-27 DIAGNOSIS — Z880 Allergy status to penicillin: Secondary | ICD-10-CM | POA: Diagnosis not present

## 2018-11-27 DIAGNOSIS — F329 Major depressive disorder, single episode, unspecified: Secondary | ICD-10-CM | POA: Diagnosis not present

## 2018-11-27 DIAGNOSIS — R51 Headache: Secondary | ICD-10-CM | POA: Diagnosis not present

## 2018-11-27 DIAGNOSIS — Z885 Allergy status to narcotic agent status: Secondary | ICD-10-CM | POA: Diagnosis not present

## 2018-11-27 DIAGNOSIS — F419 Anxiety disorder, unspecified: Secondary | ICD-10-CM | POA: Diagnosis not present

## 2018-11-27 DIAGNOSIS — R42 Dizziness and giddiness: Secondary | ICD-10-CM | POA: Diagnosis not present

## 2018-11-27 DIAGNOSIS — R45851 Suicidal ideations: Secondary | ICD-10-CM | POA: Diagnosis not present

## 2018-12-04 ENCOUNTER — Telehealth: Payer: Self-pay

## 2018-12-04 ENCOUNTER — Other Ambulatory Visit: Payer: Self-pay

## 2018-12-04 DIAGNOSIS — R0602 Shortness of breath: Secondary | ICD-10-CM | POA: Diagnosis not present

## 2018-12-04 DIAGNOSIS — E538 Deficiency of other specified B group vitamins: Secondary | ICD-10-CM | POA: Diagnosis not present

## 2018-12-04 LAB — SEDIMENTATION RATE: Sed Rate: 50 mm/hr — ABNORMAL HIGH (ref 0–40)

## 2018-12-04 NOTE — Telephone Encounter (Signed)
Patient walked in for a repeat lab but was unsure what she needed. No orders in Epic.Per note dated 11/15/18 C.[place]hart patient needs repeat sed rate after completion of prednisone.

## 2018-12-05 ENCOUNTER — Telehealth: Payer: Self-pay

## 2018-12-05 DIAGNOSIS — R700 Elevated erythrocyte sedimentation rate: Secondary | ICD-10-CM

## 2018-12-05 MED ORDER — PREDNISONE 20 MG PO TABS: 20.0000 mg | ORAL_TABLET | Freq: Every day | ORAL | 3 refills | Status: DC

## 2018-12-05 NOTE — Telephone Encounter (Signed)
-----   Message from Richardo Priest, MD sent at 12/05/2018  8:59 AM EDT ----- Normal or stable result  Her sed rate mean remains elevated however the pleuritic chest pain is resolved but will stop steroids.  She should already have weaned and discontinued

## 2018-12-05 NOTE — Telephone Encounter (Signed)
Patient informed of lab results and reports that she is still having chest pain. It has improved and is not constant but she does still experience this intermittently. Patient also reports worsened shortness of breath and wheezing.   Dr. Bettina Gavia informed and advised for patient to restart prednisone 20 mg daily and recheck sedimentation rate in 3 weeks. Prescription sent to [facility] in [place] as requested. Patient will go to the [place] office for repeat lab work, no appointment needed. Patient was instructed to contact her PCP due to shortness of breath and wheezing. Patient verbalized understanding. No further questions.

## 2018-12-17 ENCOUNTER — Telehealth: Payer: Self-pay | Admitting: *Deleted

## 2018-12-17 DIAGNOSIS — J988 Other specified respiratory disorders: Secondary | ICD-10-CM | POA: Diagnosis not present

## 2018-12-17 DIAGNOSIS — R700 Elevated erythrocyte sedimentation rate: Secondary | ICD-10-CM | POA: Diagnosis not present

## 2018-12-17 DIAGNOSIS — J309 Allergic rhinitis, unspecified: Secondary | ICD-10-CM | POA: Diagnosis not present

## 2018-12-17 DIAGNOSIS — R918 Other nonspecific abnormal finding of lung field: Secondary | ICD-10-CM | POA: Diagnosis not present

## 2018-12-17 DIAGNOSIS — B9789 Other viral agents as the cause of diseases classified elsewhere: Secondary | ICD-10-CM | POA: Diagnosis not present

## 2018-12-17 DIAGNOSIS — Z7189 Other specified counseling: Secondary | ICD-10-CM | POA: Diagnosis not present

## 2018-12-17 NOTE — Telephone Encounter (Signed)
   Cardiac Questionnaire:    Since your last visit or hospitalization:    1. Have you been having new or worsening chest pain? No   2. Have you been having new or worsening shortness of breath? No 3. Have you been having new or worsening leg swelling, wt gain, or increase in abdominal girth (pants fitting more tightly)? Yes, weight gain-currently taking her second round of prednisone.   4. Have you had any passing out spells? Yes, yesterday while cleaning around her house she "passed out for a brief moment" when bending over.   Patient will keep her scheduled Webex virtual visit with Dr. Bettina Gavia on Friday, 12/20/2018, at 9:20 am to discuss the above symptoms further.      _____________   [id number] Pre-Screening Questions:  . Do you currently have a fever? No . Have you recently travelled on a cruise, internationally, or to [place], [place], [place], [place], [place], or [place], [place] [employer]) ? No . Have you been in contact with someone that is currently pending confirmation of Covid19 testing or has been confirmed to have the [place] virus? No . Are you currently experiencing fatigue or cough? Yes, this started 2-3 days ago.   Patient went to Dr. Arna Medici office this morning due to symptoms above to potentially be tested for coronavirus. Patient reports that Dr. Arna Medici office said that she did not have enough of the symptoms to be tested for the coronavirus and that what she is experiencing is related to allergies.

## 2018-12-20 ENCOUNTER — Encounter: Payer: Self-pay | Admitting: Cardiology

## 2018-12-20 ENCOUNTER — Telehealth (INDEPENDENT_AMBULATORY_CARE_PROVIDER_SITE_OTHER): Payer: PPO | Admitting: Cardiology

## 2018-12-20 VITALS — BP 107/68 | HR 72 | Ht 63.0 in | Wt 221.4 lb

## 2018-12-20 DIAGNOSIS — I10 Essential (primary) hypertension: Secondary | ICD-10-CM

## 2018-12-20 DIAGNOSIS — R0781 Pleurodynia: Secondary | ICD-10-CM | POA: Diagnosis not present

## 2018-12-20 DIAGNOSIS — R918 Other nonspecific abnormal finding of lung field: Secondary | ICD-10-CM

## 2018-12-20 DIAGNOSIS — E782 Mixed hyperlipidemia: Secondary | ICD-10-CM

## 2018-12-20 NOTE — Progress Notes (Signed)
Virtual Visit via Video Note    Evaluation Performed:  Follow-up visit  This visit type was conducted due to national recommendations for restrictions regarding the COVID-19 Pandemic (e.g. social distancing).  This format is felt to be most appropriate for this patient at this time.  All issues noted in this document were discussed and addressed.  No physical exam was performed (except for noted visual exam findings with Video Visits).  Please refer to the patient's chart (MyChart message for video visits and phone note for telephone visits) for the patient's consent to telehealth for [facility].  Date:  12/20/2018   Home ID:  [name], DOB 05/23/1971, MRN [id number]  Patient Location:  Home  Provider location:   [place]  PCP:  Algis Greenhouse, MD  Cardiologist:  No primary care provider on file.  Dr. 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 female who presents via audio/video conferencing for a telehealth visit today.    The patient does have symptoms concerning for COVID-19 infection (fever, chills, cough, or new shortness of breath).  She tells me that since seen by her PCP she is improved not having cough shortness of breath sputum production temperature max was 99.6.  She is now using an inhaler and feels better.  [name] is a 48 y.o. female with a hx of angina with normal coronary arteriography,hypertension, Dandy walker syndrome, hydrocephalus with a VP shunt and chronic SDH  last seen 11/14/18.  She had persistent chest pain pleuritic elevated sedimentation rate screen for did not have pulmonary embolism and with sulfa allergy contraindicated for nonsteroidal anti-inflammatory drugs and placed on oral steroid.  Her most recent sedimentation rate was decreased at 40 mm/h.  She has been seen by her PCP for upper respiratory  infection was not tested for COVID-19 was advised to self quarantine for 7 days and monitor her temperature.  She had a CTA of the chest 11/19/2018 which showed stable small pulmonary nodules and no evidence of pulmonary embolism.  There is no pleural effusion.  Initial sedimentation rate was 40 7 repeat was 50 and she was given a second course of oral prednisone.  Her pleurisy and shortness of breath have resolved but she is displeased that she regained the 20 pounds she had lost on steroid therapy voices a concern of ongoing prednisone I reassured her I think it is time to withdraw and will decrease her prednisone to 10 mg daily for 4 days and then 10 mg every other day for 4 doses and then discontinue.  Her pruritus has resolved her sedimentation rate has decreased and I do not see an indication for ongoing chronic steroid therapy.  She is having no angina edema palpitation syncope or TIA.  She is practicing social distancing as a mask to wear when she is outdoors and is meticulous for handwashing  Prior CV studies:   The following studies were reviewed today:  Outpatient sedimentation rate peak 50 performed last week 40 mm/h  Outpatient CT of the chest reviewed no evidence of pulmonary embolism small stable pulmonary nodules and she has arrangements for follow-up CT through her PCP  Past Medical History:  Diagnosis Date  . Acute pain of left knee 12/06/2017  . Acute sinusitis, unspecified 08/29/2015  . Allergic rhinitis 11/25/2015  . Allergic state 10/29/2017   Overview:  Seasonal  .  ANA positive 01/04/2016  . Anxiety   . Arthritis   . Asthma   . B12 deficiency 03/05/2018  . Benign hypertension 10/25/2015  . Chronic fatigue 11/13/2017  . Chronic joint pain 12/02/2015  . Chronic pain syndrome 01/11/2016  . Chronic subdural hematoma ([place]) 08/28/2015  . Connective tissue disease ([place]) 01/04/2016  . Convulsions ([place]) 03/05/2014  . 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 Depression   . Dizziness  03/05/2014  . Epilepsy ([place]) 04/10/2016  . Fibroadenoma of left breast 02/12/2014  . Generalized abdominal pain 08/28/2015  . Generalized anxiety disorder 10/27/2015  . GERD (gastroesophageal reflux disease)   . H. pylori infection 2015  . Herpes genitalia   . Hydrocephalus ([place]) 08/29/2015  . Hyperlipidemia   . Hyperlipidemia, mixed 05/28/2016  . Hypertension   . Increased frequency of urination 09/03/2017  . Internal derangement of right knee 11/25/2015   Overview:  2017  . Lupus ([place])    Dr. Lucky Cowboy informed pt she does not have lupus  . Mastalgia 01/27/2014  . Migraine without status migrainosus, not intractable 11/25/2015  . Osteoarthritis of both knees 01/11/2016  . Personal history of healed traumatic fracture 09/15/2015  . Plantar fasciitis 11/07/2016  . Prediabetes 05/28/2016   Overview:  2018: 116/5.8  . Rectal bleeding 11/17/2014  . Recurrent major depressive disorder, in remission ([place]) 10/27/2015   Overview:  2017: Situational  . Right lower quadrant abdominal pain 06/29/2017  . 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 Screening for breast cancer 11/16/2016  . Seizures ([place])    last seizures 4-5 years ago.  . Subdural hematoma [facility]) July 2017   Bilateral  . Thyroid function test abnormal 12/06/2017   2019: TSH =7.3  . Tightness of heel cord, left 11/07/2016  . Vitamin D deficiency 06/29/2015  . Weight gain 08/28/2015   Past Surgical History:  Procedure Laterality Date  . ABDOMINAL HYSTERECTOMY  2008  . BRAIN SURGERY  02/2016   removal of 2 blood clotts from the brain.  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 BREAST EXCISIONAL BIOPSY Left 01/2014   lymph node  . BREAST SURGERY Left 02/04/14   left core bx identifying a fibroadenoma and excision of left axillary lymph node, benign  . BURR HOLE FOR SUBDURAL HEMATOMA  March 18, 2016  . CHOLECYSTECTOMY N/A 04/09/2017   Procedure: LAPAROSCOPIC CHOLECYSTECTOMY WITH INTRAOPERATIVE CHOLANGIOGRAM;  Surgeon: Robert Bellow, MD;  Location:  [facility] ORS;  Service: General;  Laterality: N/A;  . COLONOSCOPY  12/21/2015  . COLONOSCOPY W/ BIOPSIES  12/08/2016   Tubular adenoma the sigmoid. No dysplasia. Benign colonic biopsies without evidence of colitis. [name], [name] [facility]  . FRACTURE SURGERY Right 2005   hand  . LAPAROSCOPIC REVISION VENTRICULAR-PERITONEAL (V-P) SHUNT  2000  . LEFT HEART CATH AND CORONARY ANGIOGRAPHY N/A 04/26/2018   Procedure: LEFT HEART CATH AND CORONARY ANGIOGRAPHY;  Surgeon: Nelva Bush, MD;  Location: [facility];  Service: Cardiovascular;  Laterality: N/A;  . LEG SURGERY Left 2002  . NECK SURGERY  1985  . POSTERIOR LAMINECTOMY THORACIC AND LUMBAR SPINE Bilateral 1985   Scoliosis stabilization  . SHUNT REVISION  2003 & July 2017  . SPINE SURGERY  1985   Scoliosis  . TUBAL LIGATION  1995  . UPPER GI ENDOSCOPY  12/08/2016   Hypertrophic gastric polyp, mild chronic gastritis. No evidence of H. pylori. [name], [name], [facility]     No outpatient medications have been marked as taking  for the 12/20/18 encounter (Appointment) with Richardo Priest, MD.     Allergies:   Doxycycline hyclate; Levofloxacin; Morphine; Penicillins; Sulfa antibiotics; Tramadol; Sumatriptan; Clindamycin; Codeine; Diclofenac potassium; and Adhesive [tape]   Social History   Tobacco Use  . Smoking status: Never Smoker  . Smokeless tobacco: Never Used  Substance Use Topics  . Alcohol use: No  . Drug use: No     Family Hx: The patient's family history includes Brain cancer in her sister; Colon polyps in her mother; Leukemia in her maternal aunt; Leukemia (age of onset: 55) in her mother; Lung cancer in her father.  ROS:   Please see the history of present illness.     All other systems reviewed and are negative.   Labs/Other Tests and Data Reviewed:    Recent Labs: 03/19/2018: ALT 21 04/23/2018: Hemoglobin 12.7; Platelets 246 11/14/2018: BUN 8; Creatinine, Ser 1.06;  Magnesium 2.1; Potassium 3.5; Sodium 143   Recent Lipid Panel 08/14/2018 total cholesterol 145 HDL 41 LDL 81 No results found for: CHOL, TRIG, HDL, CHOLHDL, LDLCALC, LDLDIRECT  Wt Readings from Last 3 Encounters:  11/14/18 217 lb 12.8 oz (98.8 kg)  05/27/18 224 lb 12.8 oz (102 kg)  04/26/18 221 lb (100.2 kg)     Objective:    Vital Signs:  There were no vitals taken for this visit.   Well nourished, well developed female in no acute distress. She has no pallor of the skin neck vein distention or edema breathing is normal I had her take a deep breath and she did not experience pleurisy  ASSESSMENT & PLAN:    1.  Pleuritic chest pain improved she had idiopathic presumed viral pleuritis she was treated with steroids with intolerance to nonsteroidal anti-inflammatory drugs we will withdraw if it recurs consider using colchicine. 2.  Hypertension stable blood pressure target continue current treatment with combination ARB diuretic and sodium restriction 3.  Multiple pulmonary nodules she will follow-up CT with her PCP 4.  Hyperlipidemia stable continue her statin and lipids are ideal   COVID-19 Education: The signs and symptoms of COVID-19 were discussed with the patient and how to seek care for testing (follow up with PCP or arrange E-visit).  The importance of social distancing was discussed today.  Patient Risk:   After full review of this patient's clinical status, I feel that they are at least moderate risk at this time.  Time:   Today, I have spent 25 minutes with the patient with telehealth technology discussing her idiopathic pleuritis response to therapy side effects of steroids plan for withdrawal and taper of steroid therapy treatment of hypertension hyperlipidemia and in general overall reassurance.  Looking at the course of her illness respiratory infection I think it is unlikely she has COVID-19..     Medication Adjustments/Labs and Tests Ordered: Current medicines are  reviewed at length with the patient today.  Concerns regarding medicines are outlined above.  Tests Ordered: No orders of the defined types were placed in this encounter.  Medication Changes: No orders of the defined types were placed in this encounter.   Disposition:  Follow up September 2020  Signed, Shirlee More, MD  12/20/2018 8:59 AM    [place]

## 2018-12-20 NOTE — Patient Instructions (Addendum)
Medication Instructions:  Your physician has recommended you make the following change in your medication:   DECREASE prednisone 20 mg: Take 0.5 tablet (10 mg) daily for 4 days, then take 0.5 tablet (10 mg) every other day for 4 doses, then STOP this medication completely   If you need a refill on your cardiac medications before your next appointment, please call your pharmacy.   Lab work: None  If you have labs (blood work) drawn today and your tests are completely normal, you will receive your results only by: [name] MyChart Message (if you have MyChart) OR . A paper copy in the mail If you have any lab test that is abnormal or we need to change your treatment, we will call you to review the results.  Testing/Procedures: None  Follow-Up: At [facility], you and your health needs are our priority.  As part of our continuing mission to provide you with exceptional heart care, we have created designated Provider Care Teams.  These Care Teams include your primary Cardiologist (physician) and Advanced Practice Providers (APPs -  Physician Assistants and Nurse Practitioners) who all work together to provide you with the care you need, when you need it. You will need a follow up appointment in 5 months: Friday, May 23, 2019 at 9:40 am in the [facility].

## 2018-12-24 ENCOUNTER — Telehealth: Payer: Self-pay | Admitting: *Deleted

## 2018-12-24 ENCOUNTER — Encounter: Payer: Self-pay | Admitting: *Deleted

## 2018-12-24 NOTE — Telephone Encounter (Signed)
Pt called back. I updated her med list/allergies/pharmacy and health history.   She verified she received email to set up virtual visit. Advised her to call if she has any problems with it. She verbalized understanding and appreciation.

## 2018-12-24 NOTE — Addendum Note (Signed)
Addended by: Hope Pigeon on: 12/24/2018 11:39 AM   Modules accepted: Orders

## 2018-12-24 NOTE — Telephone Encounter (Signed)
Called, LVM asking pt to call office today so I can update her med list/pharmacy/allergy list/health history prior to her virtual visit tomorrow with Dr. [name].

## 2018-12-25 ENCOUNTER — Other Ambulatory Visit: Payer: Self-pay

## 2018-12-25 ENCOUNTER — Telehealth: Payer: Self-pay | Admitting: Neurology

## 2018-12-25 ENCOUNTER — Ambulatory Visit (INDEPENDENT_AMBULATORY_CARE_PROVIDER_SITE_OTHER): Payer: PPO | Admitting: Neurology

## 2018-12-25 ENCOUNTER — Encounter: Payer: Self-pay | Admitting: Neurology

## 2018-12-25 ENCOUNTER — Telehealth: Payer: Self-pay | Admitting: Cardiology

## 2018-12-25 DIAGNOSIS — I6203 Nontraumatic chronic subdural hemorrhage: Secondary | ICD-10-CM | POA: Diagnosis not present

## 2018-12-25 DIAGNOSIS — Q031 Atresia of foramina of Magendie and Luschka: Secondary | ICD-10-CM

## 2018-12-25 DIAGNOSIS — IMO0002 Reserved for concepts with insufficient information to code with codable children: Secondary | ICD-10-CM

## 2018-12-25 DIAGNOSIS — G919 Hydrocephalus, unspecified: Secondary | ICD-10-CM | POA: Diagnosis not present

## 2018-12-25 DIAGNOSIS — R42 Dizziness and giddiness: Secondary | ICD-10-CM | POA: Diagnosis not present

## 2018-12-25 DIAGNOSIS — Z982 Presence of cerebrospinal fluid drainage device: Secondary | ICD-10-CM

## 2018-12-25 DIAGNOSIS — G43709 Chronic migraine without aura, not intractable, without status migrainosus: Secondary | ICD-10-CM

## 2018-12-25 MED ORDER — GALCANEZUMAB-GNLM 120 MG/ML ~~LOC~~ SOSY: 1.0000 | PREFILLED_SYRINGE | SUBCUTANEOUS | 4 refills | Status: DC

## 2018-12-25 NOTE — Telephone Encounter (Signed)
Patient is confused about her medication. Please advise.

## 2018-12-25 NOTE — Telephone Encounter (Signed)
Took call from phone staff. Pt unable to connect to webex meeting. I walked her through steps and was able to connect her to Dr. Felecia Raymond for video visit. Nothing further needed.

## 2018-12-25 NOTE — Telephone Encounter (Signed)
Pt called stating that she can not afford $80 a month for the new injection that was prescribed to her today. Please advise.

## 2018-12-25 NOTE — Telephone Encounter (Signed)
I called and spoke with pt. Relayed below information. She verbalized understanding. She will pick up prescription and start medication. She is aware its a once a month inj.
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
## 2018-12-25 NOTE — Telephone Encounter (Signed)
I called her pharmacy to see if rx is requiring PA. It is not. It went through her insurance for 80.00 #3syringes for 90 days supply. 1 syringe (30 day supply) is 40.00. It is cheaper for her to get the 90 days supply.

## 2018-12-26 NOTE — Telephone Encounter (Signed)
Called patient and reviewed how she should be taking prednisone after her virtual visit on Friday, 12/20/2018.  DECREASE prednisone 20 mg: Take 0.5 tablet (10 mg) daily for 4 days, then take 0.5 tablet (10 mg) every other day for 4 doses, then STOP this medication completely   Patient just wanted to make sure she understood the instructions correctly. Patient verbalized understanding. No further questions.

## 2019-01-09 ENCOUNTER — Ambulatory Visit: Payer: PPO | Admitting: Neurology

## 2019-01-09 DIAGNOSIS — E559 Vitamin D deficiency, unspecified: Secondary | ICD-10-CM | POA: Diagnosis not present

## 2019-01-09 DIAGNOSIS — R7303 Prediabetes: Secondary | ICD-10-CM | POA: Diagnosis not present

## 2019-01-09 DIAGNOSIS — F411 Generalized anxiety disorder: Secondary | ICD-10-CM | POA: Diagnosis not present

## 2019-01-09 DIAGNOSIS — E782 Mixed hyperlipidemia: Secondary | ICD-10-CM | POA: Diagnosis not present

## 2019-01-09 DIAGNOSIS — Z8249 Family history of ischemic heart disease and other diseases of the circulatory system: Secondary | ICD-10-CM | POA: Insufficient documentation

## 2019-01-09 DIAGNOSIS — I10 Essential (primary) hypertension: Secondary | ICD-10-CM | POA: Diagnosis not present

## 2019-01-09 DIAGNOSIS — K915 Postcholecystectomy syndrome: Secondary | ICD-10-CM | POA: Diagnosis not present

## 2019-01-09 DIAGNOSIS — Z0000 Encounter for general adult medical examination without abnormal findings: Secondary | ICD-10-CM | POA: Diagnosis not present

## 2019-01-09 DIAGNOSIS — F3340 Major depressive disorder, recurrent, in remission, unspecified: Secondary | ICD-10-CM | POA: Diagnosis not present

## 2019-01-09 HISTORY — DX: Family history of ischemic heart disease and other diseases of the circulatory system: Z82.49

## 2019-01-14 DIAGNOSIS — G43009 Migraine without aura, not intractable, without status migrainosus: Secondary | ICD-10-CM | POA: Diagnosis not present

## 2019-01-24 DIAGNOSIS — N390 Urinary tract infection, site not specified: Secondary | ICD-10-CM | POA: Insufficient documentation

## 2019-01-24 DIAGNOSIS — Z1159 Encounter for screening for other viral diseases: Secondary | ICD-10-CM | POA: Diagnosis not present

## 2019-01-24 DIAGNOSIS — R439 Unspecified disturbances of smell and taste: Secondary | ICD-10-CM | POA: Diagnosis not present

## 2019-01-24 DIAGNOSIS — Z20828 Contact with and (suspected) exposure to other viral communicable diseases: Secondary | ICD-10-CM | POA: Diagnosis not present

## 2019-01-24 DIAGNOSIS — R300 Dysuria: Secondary | ICD-10-CM | POA: Diagnosis not present

## 2019-01-24 DIAGNOSIS — R51 Headache: Secondary | ICD-10-CM | POA: Diagnosis not present

## 2019-01-24 DIAGNOSIS — Z20822 Contact with and (suspected) exposure to covid-19: Secondary | ICD-10-CM

## 2019-01-24 DIAGNOSIS — R079 Chest pain, unspecified: Secondary | ICD-10-CM | POA: Diagnosis not present

## 2019-01-24 DIAGNOSIS — R5381 Other malaise: Secondary | ICD-10-CM | POA: Diagnosis not present

## 2019-01-24 DIAGNOSIS — J029 Acute pharyngitis, unspecified: Secondary | ICD-10-CM | POA: Diagnosis not present

## 2019-01-24 DIAGNOSIS — R509 Fever, unspecified: Secondary | ICD-10-CM | POA: Diagnosis not present

## 2019-01-24 DIAGNOSIS — M791 Myalgia, unspecified site: Secondary | ICD-10-CM | POA: Diagnosis not present

## 2019-01-24 DIAGNOSIS — R5383 Other fatigue: Secondary | ICD-10-CM | POA: Diagnosis not present

## 2019-01-24 HISTORY — DX: Urinary tract infection, site not specified: N39.0

## 2019-01-24 HISTORY — DX: Contact with and (suspected) exposure to covid-19: Z20.822

## 2019-03-13 DIAGNOSIS — E782 Mixed hyperlipidemia: Secondary | ICD-10-CM | POA: Diagnosis not present

## 2019-04-03 IMAGING — CR DG ABDOMEN 1V
1 series · 2 of 2 positions shown · non-contrast
Comparison: Abdomen and pelvis CT dated 11/17/2014.

CLINICAL DATA: Ventriculoperitoneal shunt.

EXAM:
ABDOMEN - 1 VIEW

[Series 1: dg abd 1 view · 0.14mm/px · 2 of 2 slices shown]
[im 1/2]
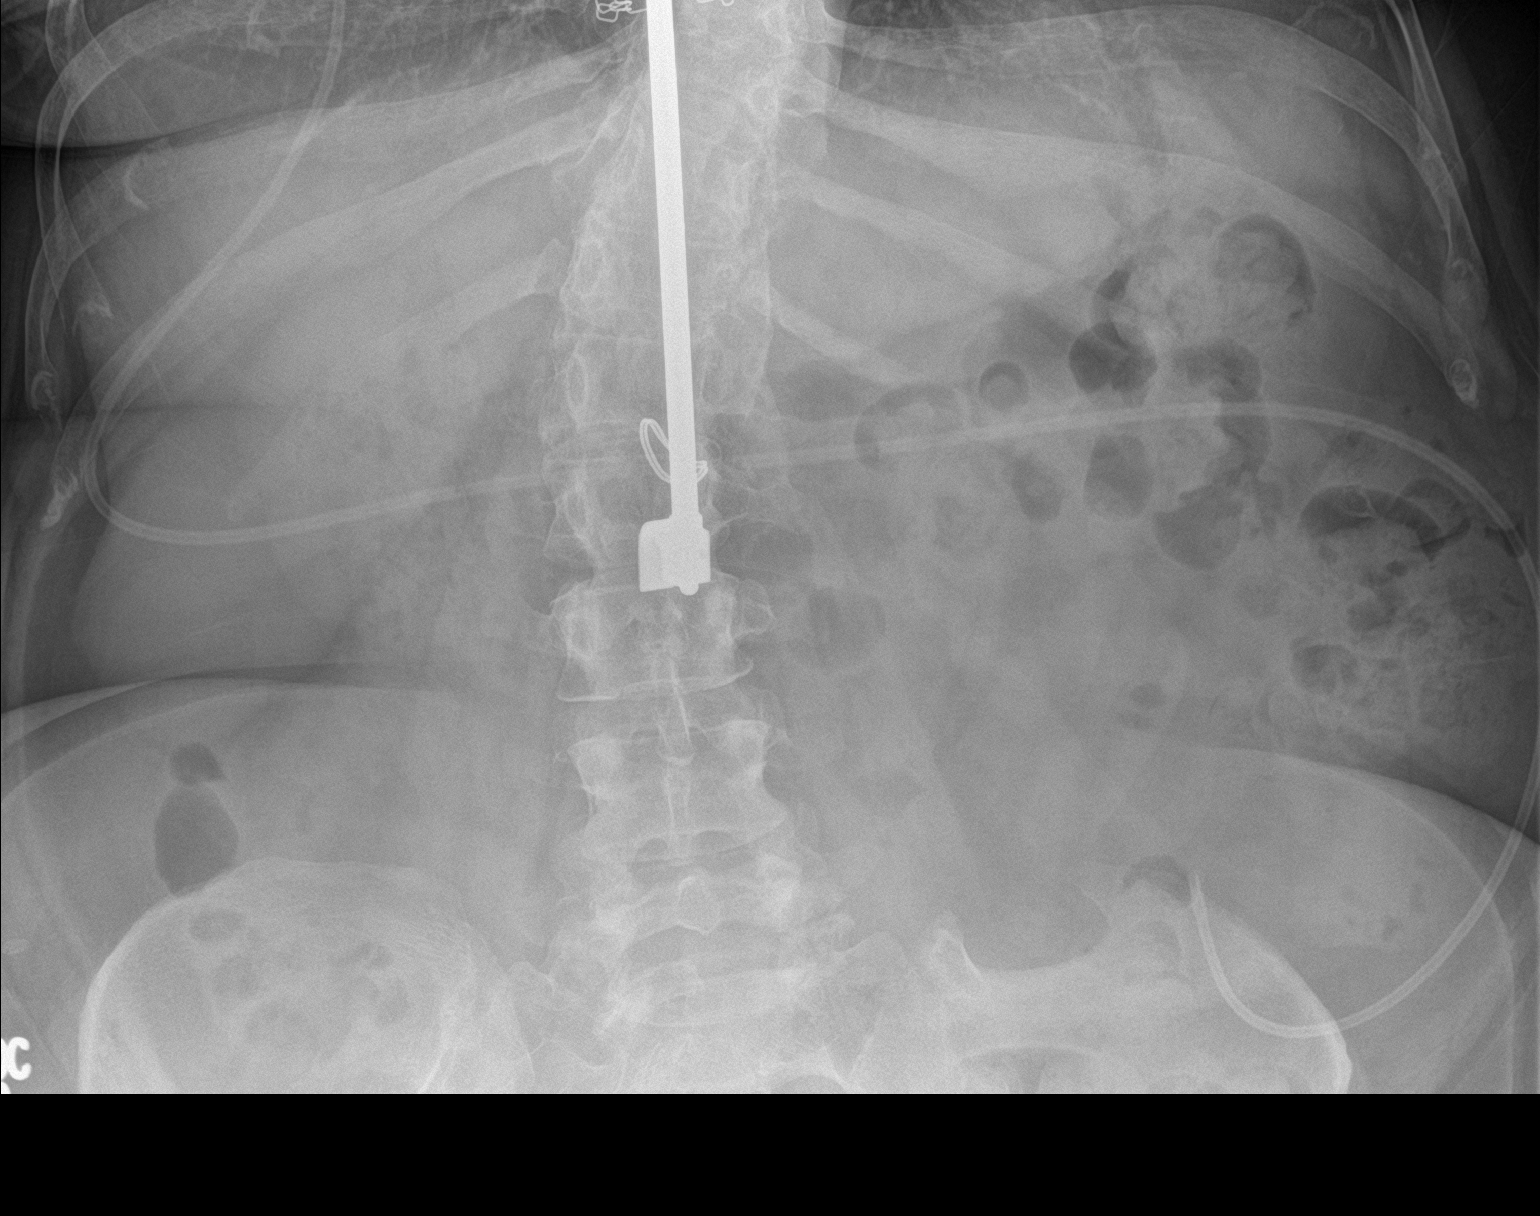
[im 2/2]
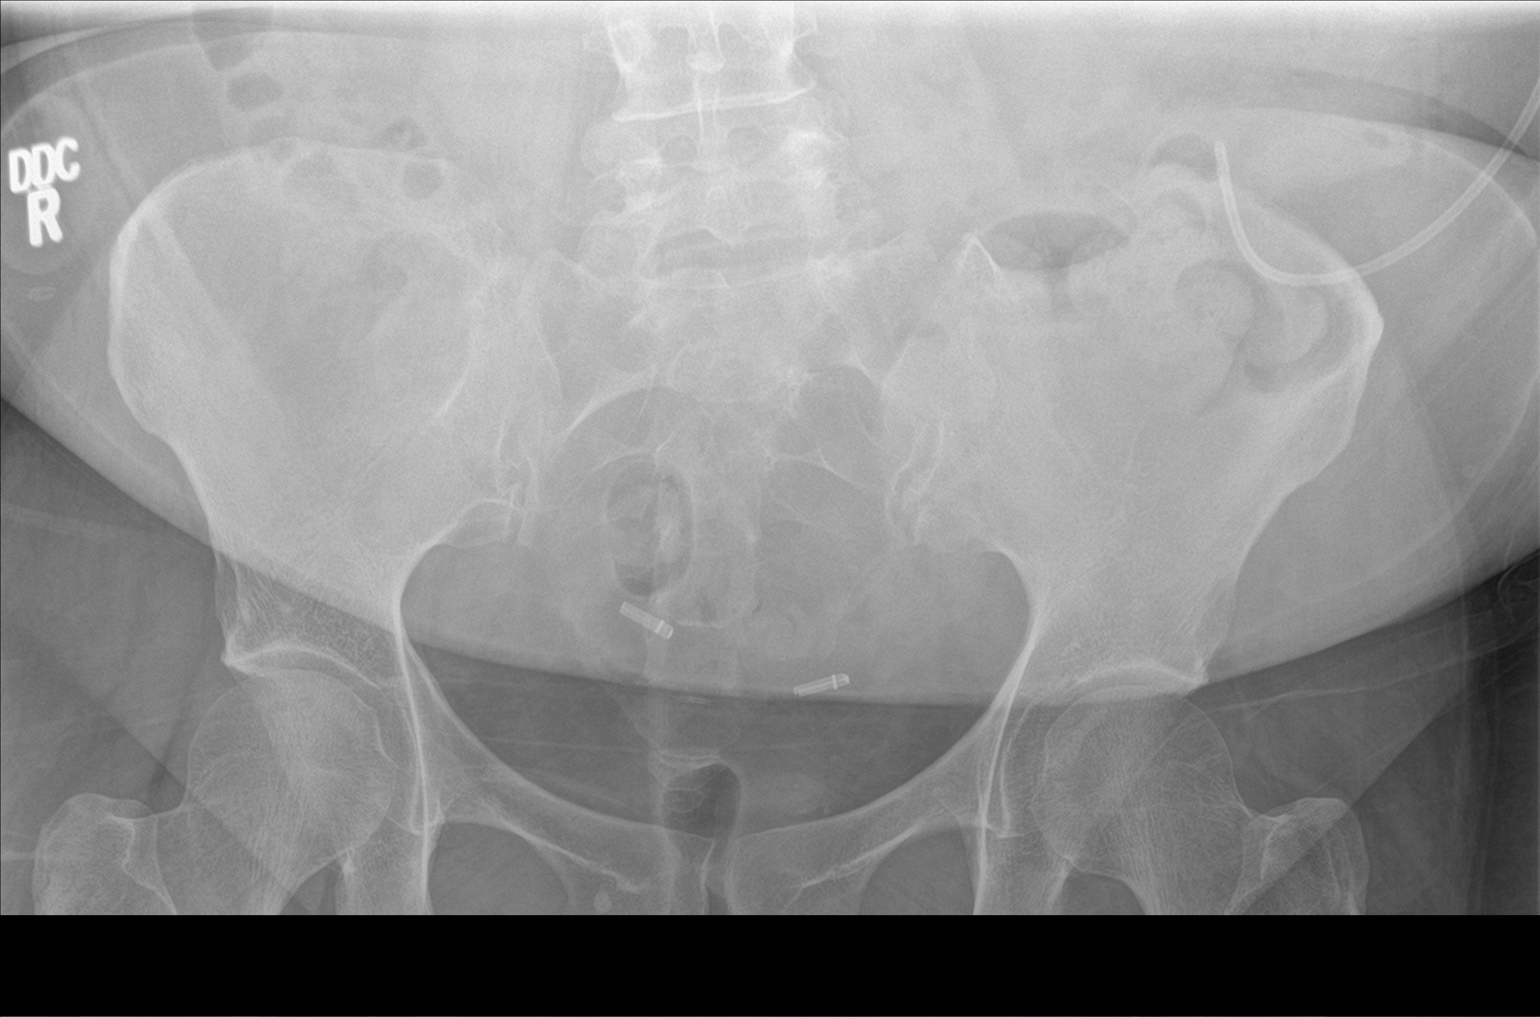

[2 of 2 positions shown; findings below may reference images not displayed]

FINDINGS: Normal bowel gas pattern. Ventriculoperitoneal shunt tubing with its
tip in the left lower abdomen. Thoracolumbar scoliosis with a single
fixation rod. Bilateral tubal ligation clips.
IMPRESSION: No acute abnormality. Ventriculoperitoneal shunt tube tip in the
left lower abdomen.

## 2019-04-23 DIAGNOSIS — Z20828 Contact with and (suspected) exposure to other viral communicable diseases: Secondary | ICD-10-CM | POA: Diagnosis not present

## 2019-04-23 DIAGNOSIS — R918 Other nonspecific abnormal finding of lung field: Secondary | ICD-10-CM | POA: Diagnosis not present

## 2019-04-23 DIAGNOSIS — R509 Fever, unspecified: Secondary | ICD-10-CM | POA: Diagnosis not present

## 2019-04-23 DIAGNOSIS — Z7189 Other specified counseling: Secondary | ICD-10-CM | POA: Diagnosis not present

## 2019-04-23 DIAGNOSIS — R05 Cough: Secondary | ICD-10-CM | POA: Diagnosis not present

## 2019-04-23 DIAGNOSIS — R5381 Other malaise: Secondary | ICD-10-CM | POA: Diagnosis not present

## 2019-04-23 DIAGNOSIS — R0602 Shortness of breath: Secondary | ICD-10-CM | POA: Diagnosis not present

## 2019-04-24 DIAGNOSIS — J069 Acute upper respiratory infection, unspecified: Secondary | ICD-10-CM | POA: Diagnosis not present

## 2019-04-24 DIAGNOSIS — J4521 Mild intermittent asthma with (acute) exacerbation: Secondary | ICD-10-CM | POA: Diagnosis not present

## 2019-04-24 DIAGNOSIS — R300 Dysuria: Secondary | ICD-10-CM | POA: Diagnosis not present

## 2019-05-06 DIAGNOSIS — E538 Deficiency of other specified B group vitamins: Secondary | ICD-10-CM | POA: Diagnosis not present

## 2019-05-06 DIAGNOSIS — N390 Urinary tract infection, site not specified: Secondary | ICD-10-CM | POA: Diagnosis not present

## 2019-05-06 DIAGNOSIS — R109 Unspecified abdominal pain: Secondary | ICD-10-CM | POA: Diagnosis not present

## 2019-05-06 DIAGNOSIS — M549 Dorsalgia, unspecified: Secondary | ICD-10-CM | POA: Diagnosis not present

## 2019-05-15 DIAGNOSIS — R109 Unspecified abdominal pain: Secondary | ICD-10-CM | POA: Diagnosis not present

## 2019-05-20 ENCOUNTER — Other Ambulatory Visit: Payer: Self-pay

## 2019-05-20 ENCOUNTER — Ambulatory Visit
Admission: RE | Admit: 2019-05-20 | Discharge: 2019-05-20 | Disposition: A | Discharge status: Home / Self Care | Payer: PPO | Source: Ambulatory Visit | Attending: Neurology | Admitting: Neurology

## 2019-05-20 ENCOUNTER — Encounter: Payer: Self-pay | Admitting: Neurology

## 2019-05-20 ENCOUNTER — Ambulatory Visit: Payer: PPO | Admitting: Neurology

## 2019-05-20 VITALS — BP 110/75 | HR 79 | Temp 97.1°F | Ht 63.0 in | Wt 212.0 lb

## 2019-05-20 DIAGNOSIS — Q031 Atresia of foramina of Magendie and Luschka: Secondary | ICD-10-CM

## 2019-05-20 DIAGNOSIS — G919 Hydrocephalus, unspecified: Secondary | ICD-10-CM

## 2019-05-20 DIAGNOSIS — Z982 Presence of cerebrospinal fluid drainage device: Secondary | ICD-10-CM

## 2019-05-20 DIAGNOSIS — R51 Headache: Secondary | ICD-10-CM

## 2019-05-20 DIAGNOSIS — R519 Headache, unspecified: Secondary | ICD-10-CM

## 2019-05-20 DIAGNOSIS — G43709 Chronic migraine without aura, not intractable, without status migrainosus: Secondary | ICD-10-CM | POA: Diagnosis not present

## 2019-05-20 DIAGNOSIS — IMO0002 Reserved for concepts with insufficient information to code with codable children: Secondary | ICD-10-CM
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
## 2019-05-21 ENCOUNTER — Telehealth: Payer: Self-pay | Admitting: *Deleted

## 2019-05-21 NOTE — Telephone Encounter (Signed)
-----   Message from Britt Bottom, MD sent at 05/21/2019 12:21 PM EDT ----- Please let the patient know that the CT scan looks ok --- shows old changes but nothing new

## 2019-05-22 ENCOUNTER — Other Ambulatory Visit: Payer: Self-pay | Admitting: Neurology

## 2019-05-22 MED ORDER — IMIPRAMINE HCL 25 MG PO TABS: 25.0000 mg | ORAL_TABLET | Freq: Every day | ORAL | 5 refills | Status: DC

## 2019-05-22 NOTE — Progress Notes (Signed)
Cardiology Office Note:    Date:  05/23/2019   ID:  [name], DOB 1971/05/11, MRN [id number]  PCP:  Algis Greenhouse, MD  Cardiologist:  Shirlee More, MD    Referring MD: Algis Greenhouse, MD    ASSESSMENT:    1. Angina pectoris ([place])   2. Benign hypertension   3. Hyperlipidemia, mixed    PLAN:    In order of problems listed above:  1. Stable she is at low risk for cardiovascular events hypertension is at target and she will resume lipid-lowering therapy. 2. Stable continue treatment ARB thiazide diuretic 3. Resume statin low-dose low intensity   Next appointment: 1 year   Medication Adjustments/Labs and Tests Ordered: Current medicines are reviewed at length with the patient today.  Concerns regarding medicines are outlined above.  No orders of the defined types were placed in this encounter.  No orders of the defined types were placed in this encounter.   Chief Complaint  Patient presents with  . Follow-up    for angina and normal coronary arteriography  . Hypertension    History of Present Illness:    [name] is a 48 y.o. 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 There is no pleural effusion   She is seen with her PCP 05/06/2019 with viral upper respiratory illness normal COVID-19 test was treated with 10 days of isolation exacerbation of asthma and dysuria.  Compliance with diet, lifestyle and medications: She discontinued her statin due to muscle symptoms  From a cardiology perspective she is doing well no complaints of angina dyspnea palpitations or syncope.  Her predominant problem is headache and is seeing a neurologist.  He stopped her statin  because of muscle symptoms I negotiator her to resume the statin lowest dose of the lowest intensity and if having difficulty tolerating 2 days a week.  Her lipids were at target on atorvastatin.  She has a history of angina with normal coronary arteriography and CT of the chest does not show coronary calcification. Past Medical History:  Diagnosis Date  . Acute pain of left knee 12/06/2017  . Acute sinusitis, unspecified 08/29/2015  . Allergic rhinitis 11/25/2015  . Allergic state 10/29/2017   Overview:  Seasonal  . ANA positive 01/04/2016  . Anxiety   . Arthritis   . Asthma   . B12 deficiency 03/05/2018  . Benign hypertension 10/25/2015  . Chronic fatigue 11/13/2017  . Chronic joint pain 12/02/2015  . Chronic pain syndrome 01/11/2016  . 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 Connective tissue disease ([place]) 01/04/2016  . Convulsions ([place]) 03/05/2014  . 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 Depression   . Dizziness 03/05/2014  . Epilepsy ([place]) 04/10/2016  . Fibroadenoma of left breast 02/12/2014  . Generalized abdominal pain 08/28/2015  . Generalized anxiety disorder 10/27/2015  . GERD (gastroesophageal reflux disease)   . H. pylori infection 2015  . Herpes genitalia   . Hydrocephalus ([place]) 08/29/2015  . Hyperlipidemia   . Hyperlipidemia, mixed 05/28/2016  . Hypertension   . Increased frequency of urination 09/03/2017  . Internal derangement of right knee 11/25/2015   Overview:  2017  . Lupus ([place])    Dr. Lucky Cowboy informed pt she  does not have lupus  . Mastalgia 01/27/2014  . Migraine without status migrainosus, not intractable 11/25/2015  . Osteoarthritis of both knees 01/11/2016  . Personal history of healed traumatic fracture 09/15/2015  . Plantar fasciitis 11/07/2016  . Prediabetes 05/28/2016   Overview:  2018: 116/5.8  . Rectal bleeding 11/17/2014  . Recurrent major depressive disorder, in remission ([place]) 10/27/2015   Overview:  2017: Situational  . Right lower quadrant abdominal pain  06/29/2017  . 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 Screening for breast cancer 11/16/2016  . Seizures ([place])    last seizures 4-5 years ago.  . Subdural hematoma [facility]) July 2017   Bilateral  . Thyroid function test abnormal 12/06/2017   2019: TSH =7.3  . Tightness of heel cord, left 11/07/2016  . Vitamin D deficiency 06/29/2015  . Weight gain 08/28/2015    Past Surgical History:  Procedure Laterality Date  . ABDOMINAL HYSTERECTOMY  2008  . BRAIN SURGERY  02/2016   removal of 2 blood clotts from the brain.  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 BREAST EXCISIONAL BIOPSY Left 01/2014   lymph node  . BREAST SURGERY Left 02/04/14   left core bx identifying a fibroadenoma and excision of left axillary lymph node, benign  . BURR HOLE FOR SUBDURAL HEMATOMA  March 18, 2016  . CHOLECYSTECTOMY N/A 04/09/2017   Procedure: LAPAROSCOPIC CHOLECYSTECTOMY WITH INTRAOPERATIVE CHOLANGIOGRAM;  Surgeon: Robert Bellow, MD;  Location: [facility] ORS;  Service: General;  Laterality: N/A;  . COLONOSCOPY  12/21/2015  . COLONOSCOPY W/ BIOPSIES  12/08/2016   Tubular adenoma the sigmoid. No dysplasia. Benign colonic biopsies without evidence of colitis. [name], [name] [facility]  . FRACTURE SURGERY Right 2005   hand  . LAPAROSCOPIC REVISION VENTRICULAR-PERITONEAL (V-P) SHUNT  2000  . LEFT HEART CATH AND CORONARY ANGIOGRAPHY N/A 04/26/2018   Procedure: LEFT HEART CATH AND CORONARY ANGIOGRAPHY;  Surgeon: Nelva Bush, MD;  Location: [place] [facility];  Service: Cardiovascular;  Laterality: N/A;  . LEG SURGERY Left 2002  . NECK SURGERY  1985  . POSTERIOR LAMINECTOMY THORACIC AND LUMBAR SPINE Bilateral 1985   Scoliosis stabilization  . SHUNT REVISION  2003 & July 2017  . SPINE SURGERY  1985   Scoliosis  . TUBAL LIGATION  1995  . UPPER GI ENDOSCOPY  12/08/2016   Hypertrophic gastric polyp, mild chronic gastritis. No evidence of H. pylori. [name], [name], [facility]     Current Medications: Current Meds  Medication Sig  . albuterol (PROVENTIL HFA;VENTOLIN HFA) 108 (90 Base) MCG/ACT inhaler Inhale 2 puffs into the lungs every 6 (six) hours as needed.  [name] buPROPion (WELLBUTRIN SR) 100 MG 12 hr tablet Take 100 mg by mouth daily.   . cetirizine (ZYRTEC) 10 MG tablet Take 10 mg by mouth daily.   . citalopram (CELEXA) 20 MG tablet Take 20 mg by mouth daily.   . cyanocobalamin (,VITAMIN B-12,) 1000 MCG/ML injection Inject 1,000 mcg into the muscle every 30 (thirty) days.  [name] dicyclomine (BENTYL) 20 MG tablet Take 1 tablet (20 mg total) by mouth 3 (three) times daily as needed for spasms.  [name] Galcanezumab-gnlm (EMGALITY) 120 MG/ML SOSY Inject 1 Syringe into the skin every 28 (twenty-eight) days.  [name] losartan-hydrochlorothiazide (HYZAAR) 50-12.5 MG tablet Take 1 tablet by mouth daily.  . montelukast (SINGULAIR) 10 MG tablet TAKE ONE TABLET BY MOUTH AT BEDTIME  . mupirocin ointment (BACTROBAN) 2 % APPLY OINTMENT TOPICALLY TWICE DAILY TO AFFECTED AREAS  ON SCALP FOR 7 10 DAYS  . nitroGLYCERIN (NITROSTAT) 0.4 MG SL tablet Place 1 tablet (0.4 mg total) under the tongue every 5 (five) minutes as needed for chest pain.  . rizatriptan (MAXALT) 10 MG tablet Take 10 mg by mouth daily as needed for migraine.   . topiramate (TOPAMAX) 100 MG tablet Take 100 mg by mouth 2 (two) times daily.   [name] triamcinolone cream (KENALOG) 0.1 % APPLY CREAM EXTERNALLY TWICE DAILY TO AFFECTED AREA AS NEEDED. PATIENT MIX 1 TO 1 WITH NYSTATIN.  . valACYclovir (VALTREX) 500 MG tablet Take 500 mg by mouth daily.      Allergies:   Doxycycline hyclate, Levofloxacin, Morphine, Penicillins, Sulfa antibiotics, Tramadol, Sumatriptan, Clindamycin, Codeine, Diclofenac potassium, and Adhesive [tape]   Social History   Socioeconomic History  . Marital status: Married    Spouse name: Sam  . Number of children: 2  . Years of education: GED  . Highest education level: Not on file  Occupational History  . Not  on file  Social Needs  . Financial resource strain: Not on file  . Food insecurity    Worry: Not on file    Inability: Not on file  . Transportation needs    Medical: Not on file    Non-medical: Not on file  Tobacco Use  . Smoking status: Never Smoker  . Smokeless tobacco: Never Used  Substance and Sexual Activity  . Alcohol use: No  . Drug use: No  . Sexual activity: Not Currently  Lifestyle  . Physical activity    Days per week: 5 days    Minutes per session: 30 min  . Stress: Not at all  Relationships  . Social Herbalist on phone: Not on file    Gets together: Not on file    Attends religious service: Not on file    Active member of club or organization: Not on file    Attends meetings of clubs or organizations: Not on file    Relationship status: Not on file  Other Topics Concern  . Not on file  Social History Narrative   Right handed    Caffeine use: tea daily   Lives with husband, Sam     Family History: The patient's family history includes Brain cancer in her sister; Colon polyps in her mother; Leukemia in her maternal aunt; Leukemia (age of onset: 7) in her mother; Lung cancer in her father. ROS:   Please see the history of present illness.    All other systems reviewed and are negative.  EKGs/Labs/Other Studies Reviewed:    The following studies were reviewed today:  EKG:  EKG ordered today and personally reviewed.  The ekg ordered today demonstrates sinus rhythm nonspecific T wave  Recent Labs: 11/14/2018: BUN 8; Creatinine, Ser 1.06; Magnesium 2.1; Potassium 3.5; Sodium 143  03/13/2019: Cholesterol 172 LDL 86 HDL 34 and normal liver function COVID-19 test -04/23/2019 12/17/2018 sedimentation rate was 40  Chest x-ray 04/23/2019 normal   Physical Exam:    VS:  BP 118/78 (BP Location: Right Arm, Patient Position: Sitting, Cuff Size: Large)   Pulse (!) 59   Ht '5\' 3"'  (1.6 m)   Wt 211 lb 12.8 oz (96.1 kg)   SpO2 97%   BMI 37.52 kg/m      Wt Readings from Last 3 Encounters:  05/23/19 211 lb 12.8 oz (96.1 kg)  05/20/19 212 lb (96.2 kg)  12/20/18 221 lb 6.4 oz (100.4 kg)  GEN: Mood is flat and depressed well nourished, well developed in no acute distress HEENT: Normal NECK: No JVD; No carotid bruits LYMPHATICS: No lymphadenopathy CARDIAC: RRR, no murmurs, rubs, gallops RESPIRATORY:  Clear to auscultation without rales, wheezing or rhonchi  ABDOMEN: Soft, non-tender, non-distended MUSCULOSKELETAL:  No edema; No deformity  SKIN: Warm and dry NEUROLOGIC:  Alert and oriented x 3 PSYCHIATRIC:  Normal affect    Signed, Shirlee More, MD  05/23/2019 9:37 AM    [place]

## 2019-05-23 ENCOUNTER — Other Ambulatory Visit: Payer: Self-pay

## 2019-05-23 ENCOUNTER — Ambulatory Visit (INDEPENDENT_AMBULATORY_CARE_PROVIDER_SITE_OTHER): Payer: PPO | Admitting: Cardiology

## 2019-05-23 VITALS — BP 118/78 | HR 59 | Ht 63.0 in | Wt 211.8 lb

## 2019-05-23 DIAGNOSIS — I209 Angina pectoris, unspecified: Secondary | ICD-10-CM | POA: Diagnosis not present

## 2019-05-23 DIAGNOSIS — E782 Mixed hyperlipidemia: Secondary | ICD-10-CM | POA: Diagnosis not present

## 2019-05-23 DIAGNOSIS — I10 Essential (primary) hypertension: Secondary | ICD-10-CM | POA: Diagnosis not present

## 2019-05-23 MED ORDER — PRAVASTATIN SODIUM 20 MG PO TABS: 20.0000 mg | ORAL_TABLET | Freq: Every evening | ORAL | 3 refills | Status: DC

## 2019-05-23 NOTE — Patient Instructions (Signed)
Medication Instructions:  Your physician has recommended you make the following change in your medication:   Pravastatin 20 mg Daily. May reduce to 2 times a week if if muscle aches occur  If you need a refill on your cardiac medications before your next appointment, please call your pharmacy.   Lab work: None If you have labs (blood work) drawn today and your tests are completely normal, you will receive your results only by: [name] MyChart Message (if you have MyChart) OR . A paper copy in the mail If you have any lab test that is abnormal or we need to change your treatment, we will call you to review the results.  Testing/Procedures: None  Follow-Up: At [facility], you and your health needs are our priority.  As part of our continuing mission to provide you with exceptional heart care, we have created designated Provider Care Teams.  These Care Teams include your primary Cardiologist (physician) and Advanced Practice Providers (APPs -  Physician Assistants and Nurse Practitioners) who all work together to provide you with the care you need, when you need it. You will need a follow up appointment in 12 months.  Please call our office 2 months in advance to schedule this appointment.  You may see No primary care provider on file. or another member of our [employer] [facility] [place]: Jenne Campus, MD . Jyl Heinz, MD  Any Other Special Instructions Will Be Listed Below (If Applicable).

## 2019-05-27 ENCOUNTER — Other Ambulatory Visit: Payer: Self-pay | Admitting: Physician Assistant

## 2019-05-27 DIAGNOSIS — E069 Thyroiditis, unspecified: Secondary | ICD-10-CM | POA: Diagnosis not present

## 2019-05-27 DIAGNOSIS — H9209 Otalgia, unspecified ear: Secondary | ICD-10-CM | POA: Diagnosis not present

## 2019-05-27 DIAGNOSIS — R599 Enlarged lymph nodes, unspecified: Secondary | ICD-10-CM | POA: Diagnosis not present

## 2019-05-27 DIAGNOSIS — G501 Atypical facial pain: Secondary | ICD-10-CM | POA: Diagnosis not present

## 2019-05-27 DIAGNOSIS — K219 Gastro-esophageal reflux disease without esophagitis: Secondary | ICD-10-CM | POA: Diagnosis not present

## 2019-05-27 DIAGNOSIS — H9203 Otalgia, bilateral: Secondary | ICD-10-CM

## 2019-05-27 DIAGNOSIS — E060 Acute thyroiditis: Secondary | ICD-10-CM | POA: Diagnosis not present

## 2019-06-05 ENCOUNTER — Other Ambulatory Visit: Payer: Self-pay

## 2019-06-05 ENCOUNTER — Ambulatory Visit
Admission: RE | Admit: 2019-06-05 | Discharge: 2019-06-05 | Disposition: A | Discharge status: Home / Self Care | Payer: PPO | Source: Ambulatory Visit | Attending: Physician Assistant | Admitting: Physician Assistant

## 2019-06-05 DIAGNOSIS — R490 Dysphonia: Secondary | ICD-10-CM | POA: Diagnosis not present

## 2019-06-05 DIAGNOSIS — H9203 Otalgia, bilateral: Secondary | ICD-10-CM | POA: Insufficient documentation

## 2019-06-05 LAB — POCT I-STAT CREATININE: Creatinine, Ser: 1.2 mg/dL — ABNORMAL HIGH (ref 0.44–1.00)

## 2019-06-05 MED ORDER — IOHEXOL 300 MG/ML  SOLN
75.0000 mL | Freq: Once | INTRAMUSCULAR | Status: AC | PRN
  Administered 2019-06-05: 75 mL via INTRAVENOUS

## 2019-06-06 IMAGING — CT CT CHEST W/O CM
1 series · 15 of 34 positions shown, 19 images · non-contrast
Comparison: None.

CLINICAL DATA: Worsening shortness of Breath over the last 6
months.

EXAM:
CT CHEST WITHOUT CONTRAST
TECHNIQUE: Multidetector CT imaging of the chest was performed following the
standard protocol without IV contrast.

[Series 2: thorax · axial · 0.77mm/px · z∈[-556,-324]mm · 15 of 138 slices shown, 19 images]
[im 11/138  mediastinal]
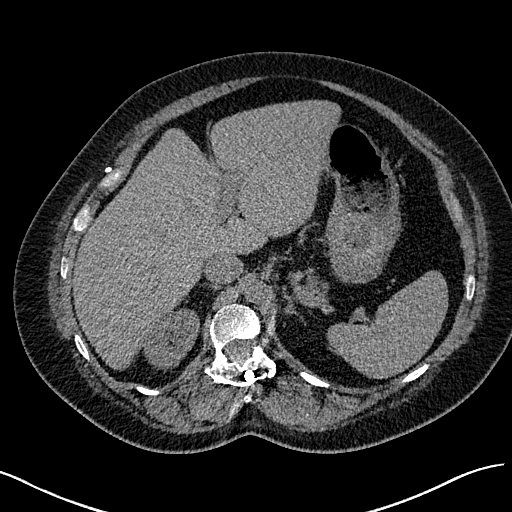
[im 11/138  lung]
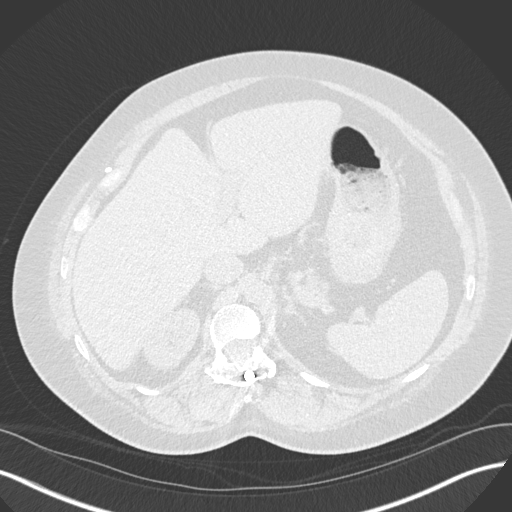
[im 21/138  lung]
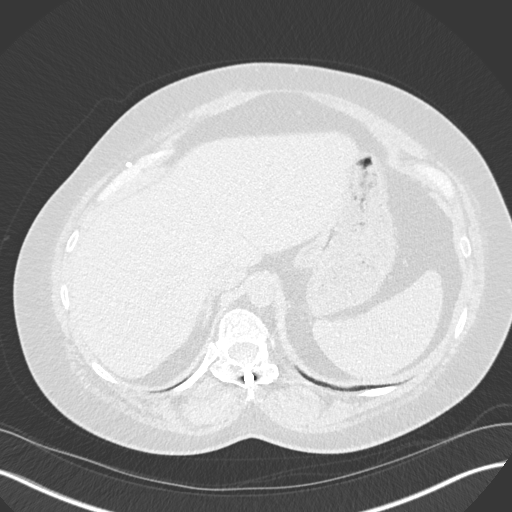
[im 28/138  lung]
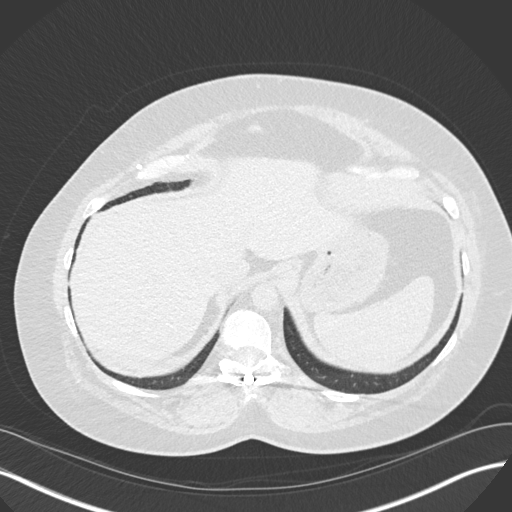
[im 36/138  lung]
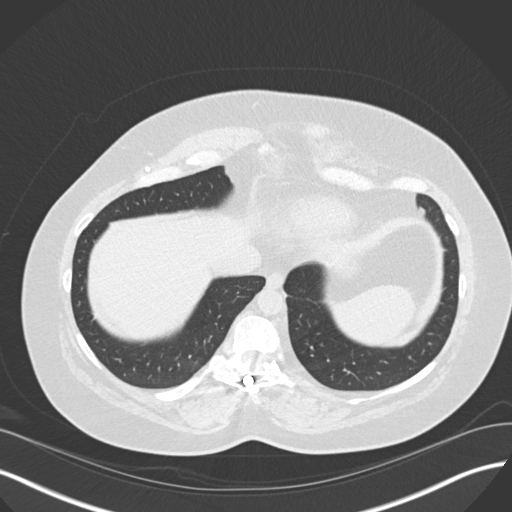
[im 46/138  mediastinal]
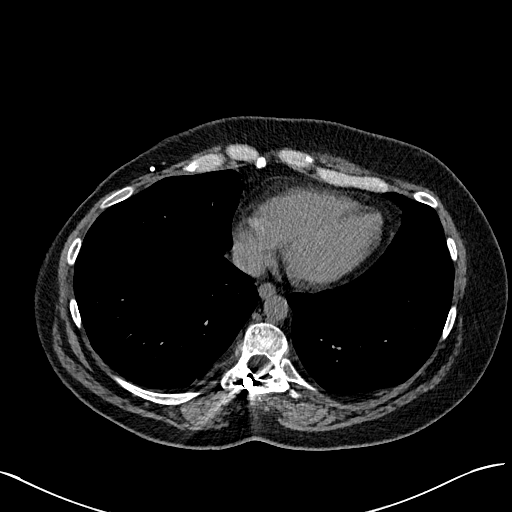
[im 46/138  lung]
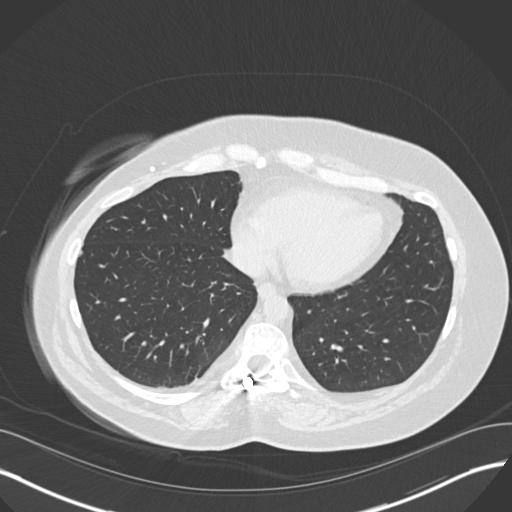
[im 55/138  lung]
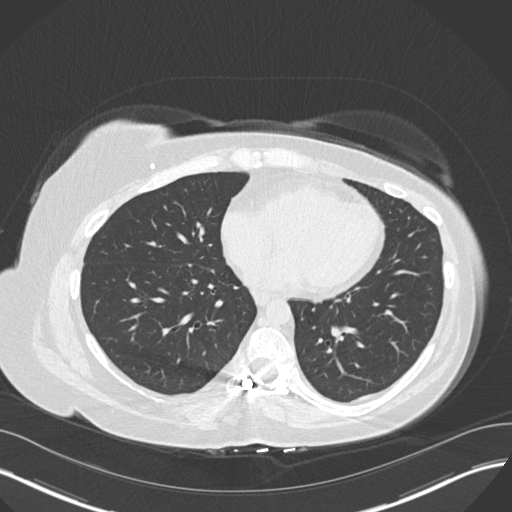
[im 61/138  lung]
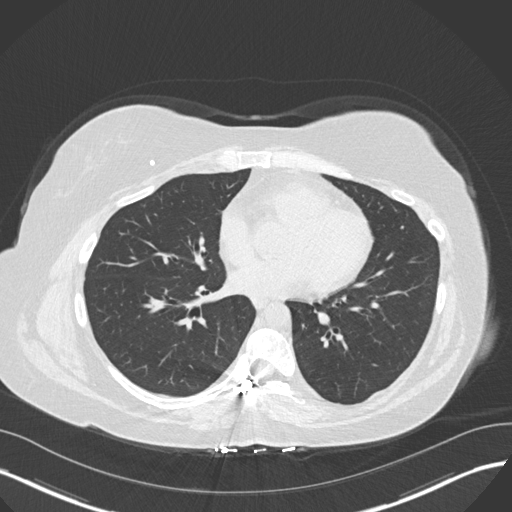
[im 72/138  lung]
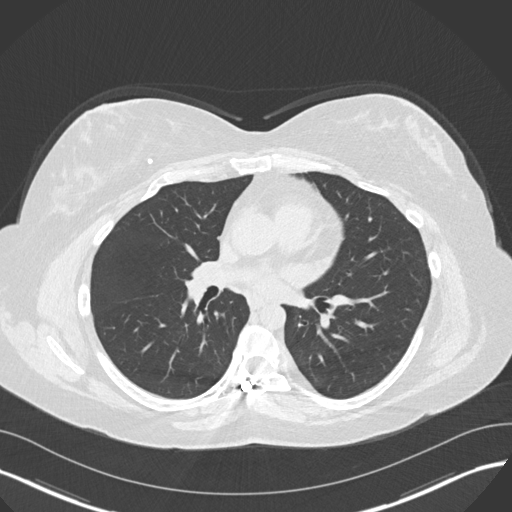
[im 77/138  mediastinal]
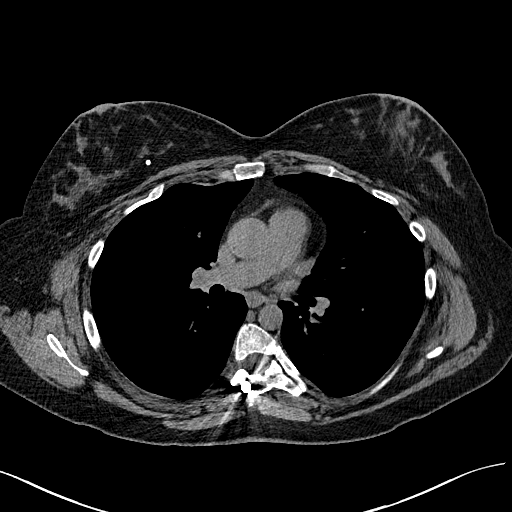
[im 77/138  lung]
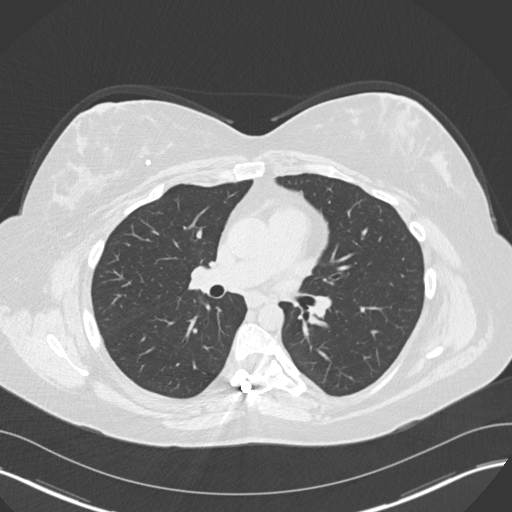
[im 83/138  lung]
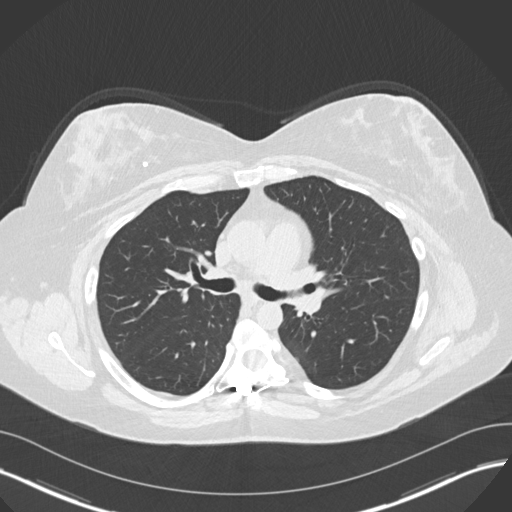
[im 92/138  lung]
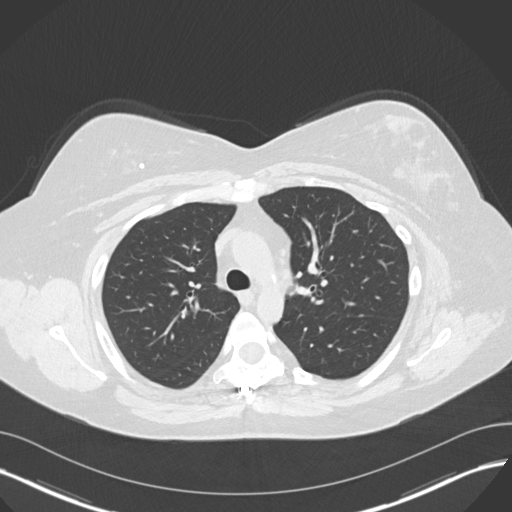
[im 102/138  lung]
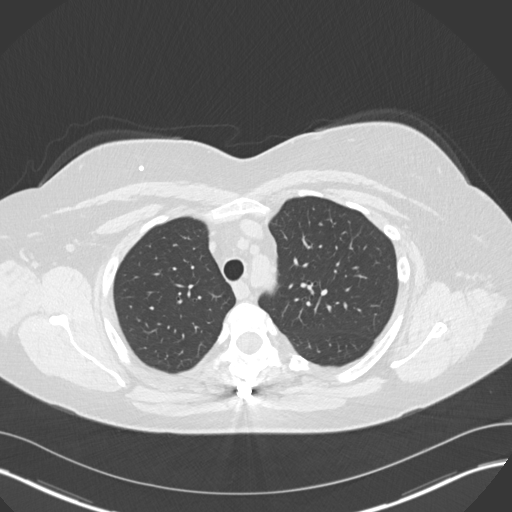
[im 110/138  mediastinal]
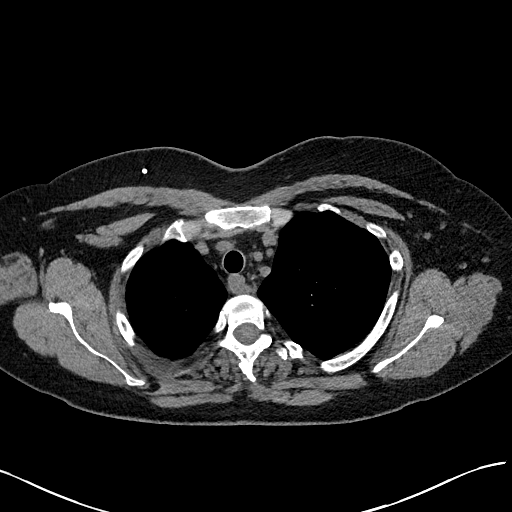
[im 110/138  lung]
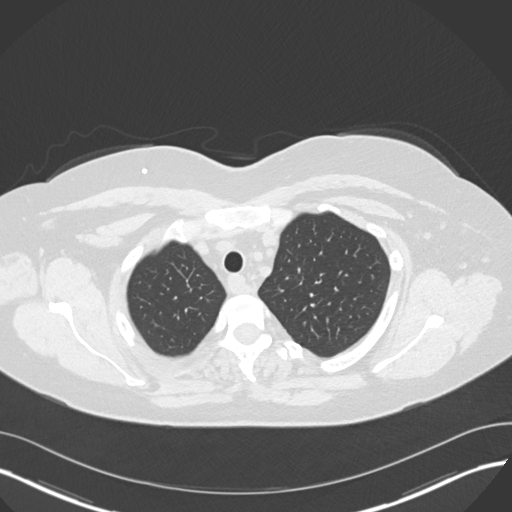
[im 117/138  lung]
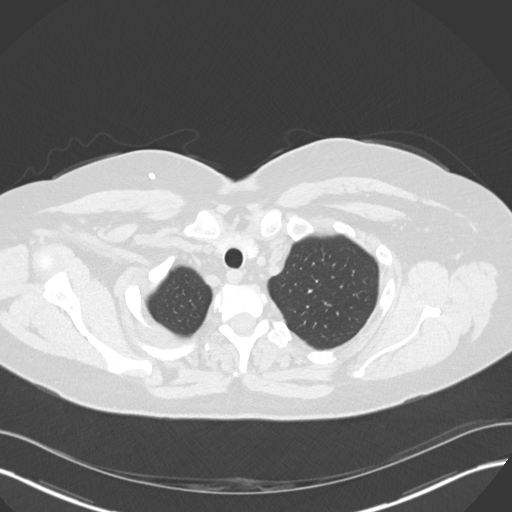
[im 127/138  lung]
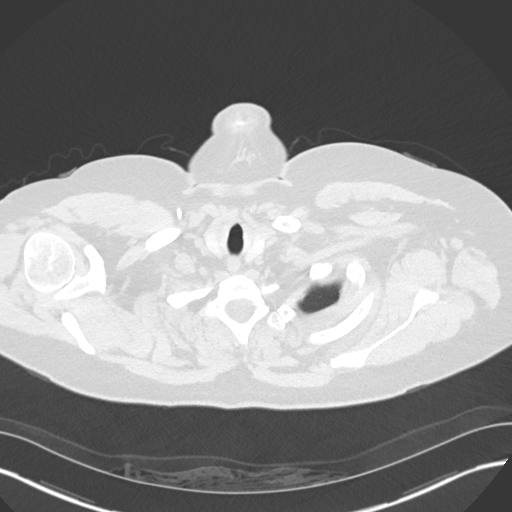

[15 of 34 positions shown; findings below may reference images not displayed]

FINDINGS: Cardiovascular: The heart size is normal.  No pericardial effusion.

Mediastinum/Nodes: No mediastinal lymphadenopathy. No evidence for
gross hilar lymphadenopathy although assessment is limited by the
lack of intravenous contrast on today's study. The esophagus has
normal imaging features.

Lungs/Pleura: 4 mm pulmonary nodule identified posterior right upper
lobe (image 35 series 3). 2 mm left lower lobe nodule seen on image
70. 5 mm pleural-based nodule visible on image 57 of series 3, left
lower lobe. 2 mm perifissural nodule identified in the left upper
lobe on image 40. Other scattered tiny pulmonary nodules measure 3
mm or less in size. No focal airspace consolidation. No pulmonary
edema or pleural effusion.

Upper Abdomen: Unremarkable.

Musculoskeletal: Patient is status post thoracolumbar fusion.
Catheter tubing anterior right chest wall likely related to VP
shunt.
IMPRESSION: 1. No findings to explain the shortness of breath history.
2. Multiple bilateral pulmonary nodules measuring up to 5 mm
diameter. No follow-up needed if patient is low-risk (and has no
known or suspected primary neoplasm). Non-contrast chest CT can be
considered in 12 months if patient is high-risk. This recommendation
follows the consensus statement: Guidelines for Management of
Incidental Pulmonary Nodules Detected on CT Images: From the

## 2019-06-10 DIAGNOSIS — R110 Nausea: Secondary | ICD-10-CM | POA: Diagnosis not present

## 2019-06-10 DIAGNOSIS — R197 Diarrhea, unspecified: Secondary | ICD-10-CM | POA: Diagnosis not present

## 2019-06-10 DIAGNOSIS — R634 Abnormal weight loss: Secondary | ICD-10-CM | POA: Diagnosis not present

## 2019-06-10 DIAGNOSIS — R921 Mammographic calcification found on diagnostic imaging of breast: Secondary | ICD-10-CM | POA: Diagnosis not present

## 2019-06-10 DIAGNOSIS — R109 Unspecified abdominal pain: Secondary | ICD-10-CM | POA: Diagnosis not present

## 2019-06-13 DIAGNOSIS — R197 Diarrhea, unspecified: Secondary | ICD-10-CM | POA: Diagnosis not present

## 2019-06-13 DIAGNOSIS — R109 Unspecified abdominal pain: Secondary | ICD-10-CM | POA: Diagnosis not present

## 2019-06-16 DIAGNOSIS — F064 Anxiety disorder due to known physiological condition: Secondary | ICD-10-CM | POA: Diagnosis not present

## 2019-06-16 DIAGNOSIS — M5013 Cervical disc disorder with radiculopathy, cervicothoracic region: Secondary | ICD-10-CM | POA: Diagnosis not present

## 2019-06-16 DIAGNOSIS — R911 Solitary pulmonary nodule: Secondary | ICD-10-CM | POA: Diagnosis not present

## 2019-06-16 DIAGNOSIS — H9209 Otalgia, unspecified ear: Secondary | ICD-10-CM | POA: Diagnosis not present

## 2019-06-16 DIAGNOSIS — R599 Enlarged lymph nodes, unspecified: Secondary | ICD-10-CM | POA: Diagnosis not present

## 2019-06-16 DIAGNOSIS — E060 Acute thyroiditis: Secondary | ICD-10-CM | POA: Diagnosis not present

## 2019-06-16 DIAGNOSIS — K219 Gastro-esophageal reflux disease without esophagitis: Secondary | ICD-10-CM | POA: Diagnosis not present

## 2019-06-16 DIAGNOSIS — J9811 Atelectasis: Secondary | ICD-10-CM | POA: Diagnosis not present

## 2019-06-19 NOTE — Telephone Encounter (Signed)
Pt came and picked up samples from office today.

## 2019-06-24 DIAGNOSIS — R109 Unspecified abdominal pain: Secondary | ICD-10-CM | POA: Diagnosis not present

## 2019-06-24 DIAGNOSIS — R197 Diarrhea, unspecified: Secondary | ICD-10-CM | POA: Diagnosis not present

## 2019-06-24 DIAGNOSIS — A045 Campylobacter enteritis: Secondary | ICD-10-CM | POA: Diagnosis not present

## 2019-06-24 DIAGNOSIS — K921 Melena: Secondary | ICD-10-CM | POA: Diagnosis not present

## 2019-06-27 DIAGNOSIS — N200 Calculus of kidney: Secondary | ICD-10-CM | POA: Diagnosis not present

## 2019-06-27 DIAGNOSIS — R109 Unspecified abdominal pain: Secondary | ICD-10-CM | POA: Diagnosis not present

## 2019-06-29 IMAGING — CT CT ABD-PELV W/ CM
3 of 6 series · 16 of 46 positions shown, 18 images · IV contrast (APPLIED)
Comparison: 11/17/2014

CLINICAL DATA: Right lower quadrant pain 1 week with nausea
vomiting and diarrhea. Frequent urination. Hemoptysis last night.

EXAM:
CT ABDOMEN AND PELVIS WITH CONTRAST
TECHNIQUE: Multidetector CT imaging of the abdomen and pelvis was performed
using the standard protocol following bolus administration of
intravenous contrast.
CONTRAST:  100mL RP3R0T-2RR IOPAMIDOL (RP3R0T-2RR) INJECTION 61%

[Series 4: lung · axial · 0.85mm/px · z∈[-549,-459]mm · 3 of 38 slices shown]
[im 10/38  bone]
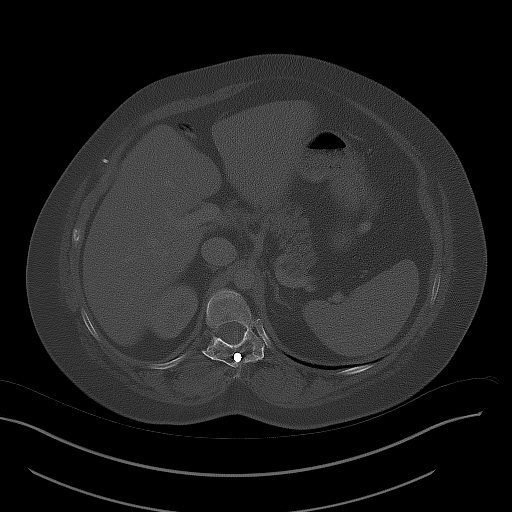
[im 19/38  bone]
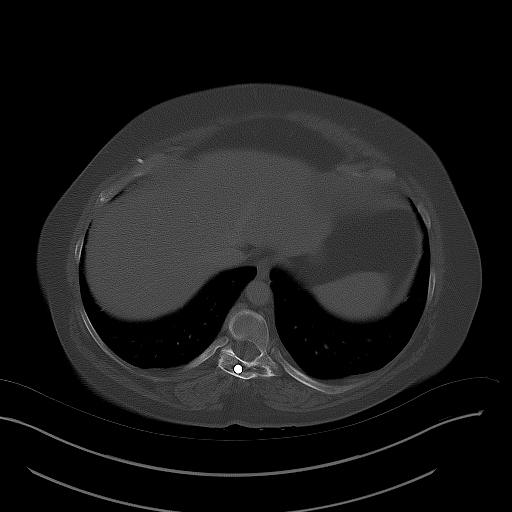
[im 28/38  bone]
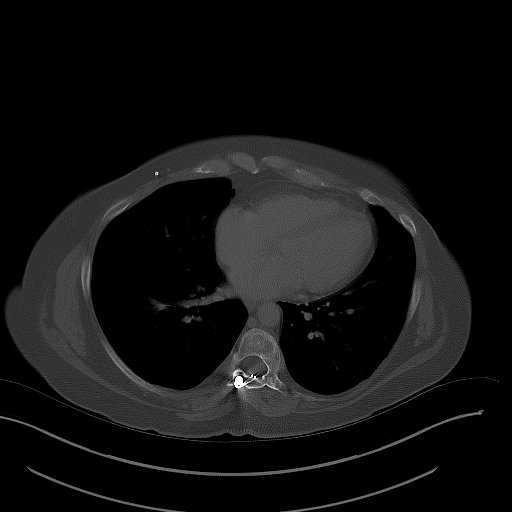

[Series 5: coronal st · coronal · 0.66mm/px · 3 of 105 slices shown]
[im 35/105  soft-tissue]
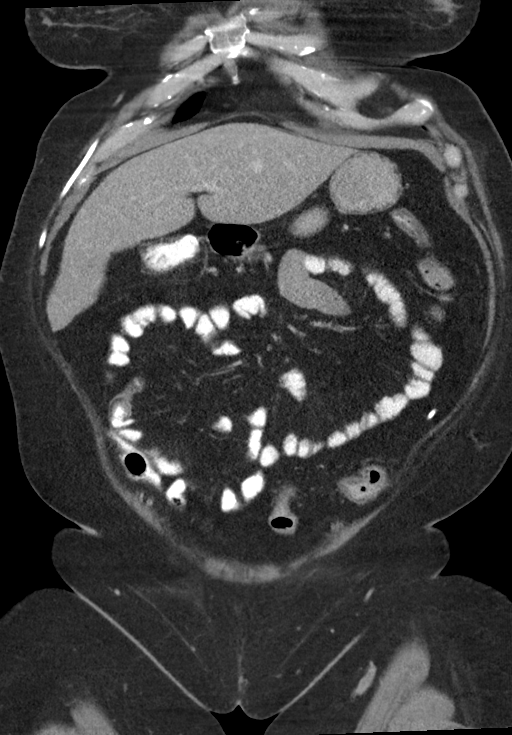
[im 47/105  soft-tissue]
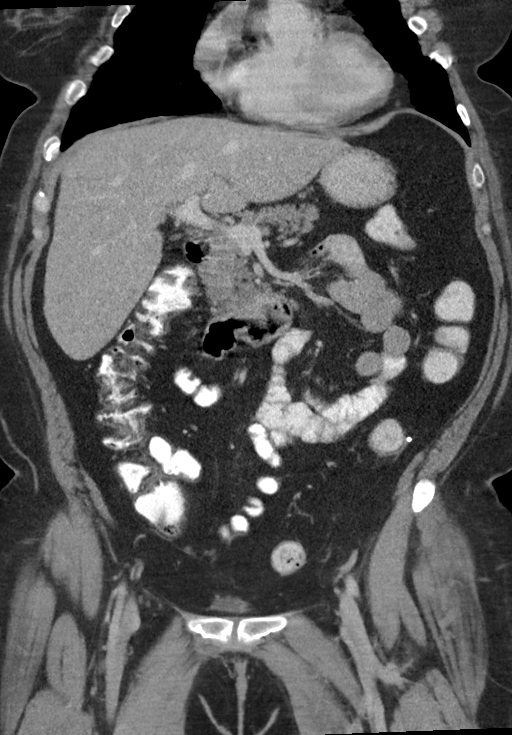
[im 58/105  soft-tissue]
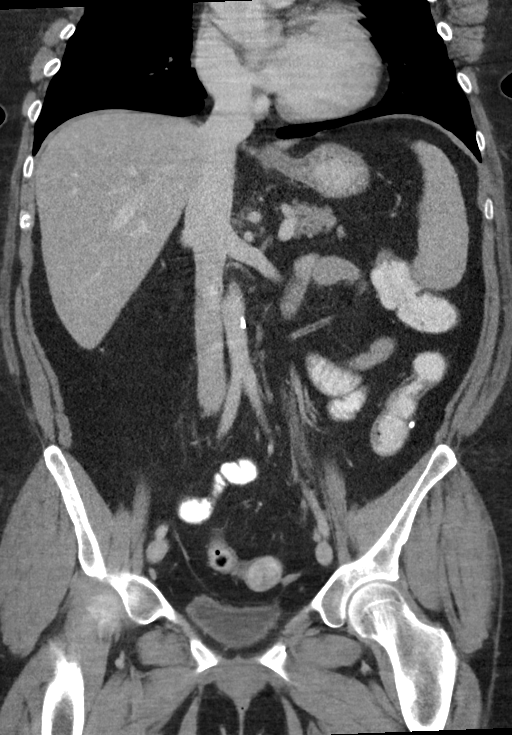

[Series 7: routine abd/pel with 2 (person_name) · axial · 0.85mm/px · z∈[-854,-454]mm · 10 of 98 slices shown, 12 images]
[im 9/98  soft-tissue]
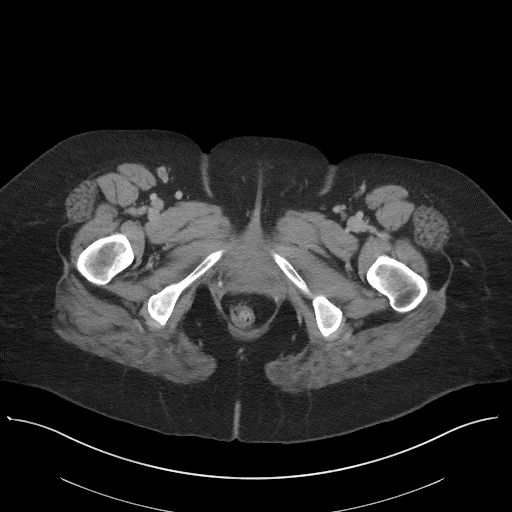
[im 9/98  bone]
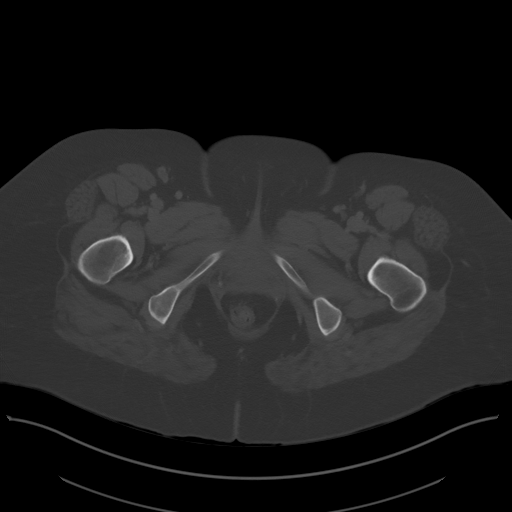
[im 18/98  soft-tissue]
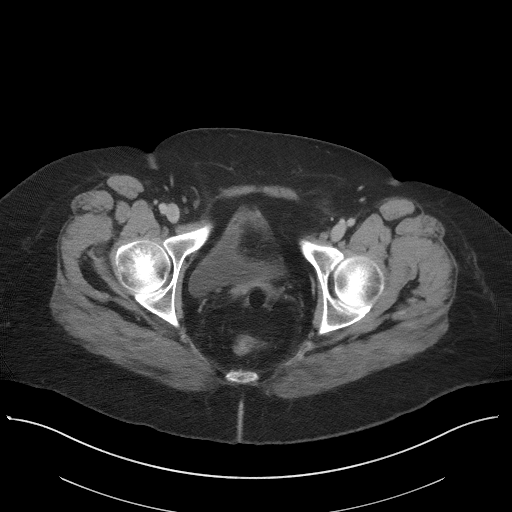
[im 27/98  soft-tissue]
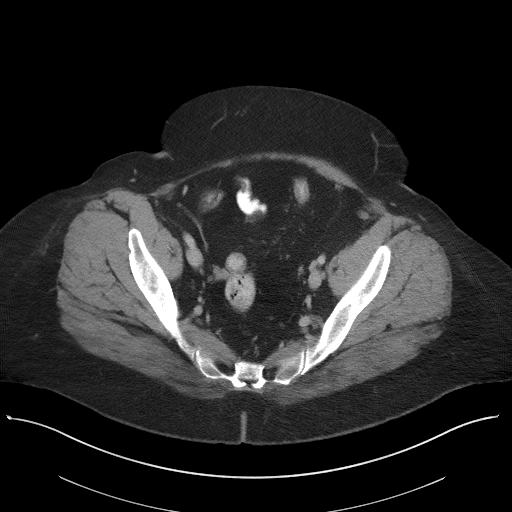
[im 36/98  soft-tissue]
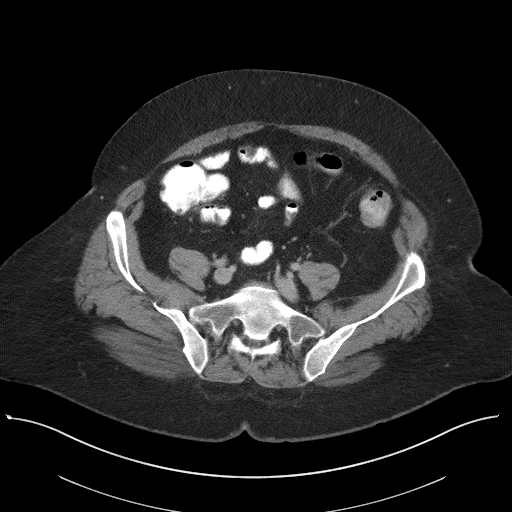
[im 45/98  soft-tissue]
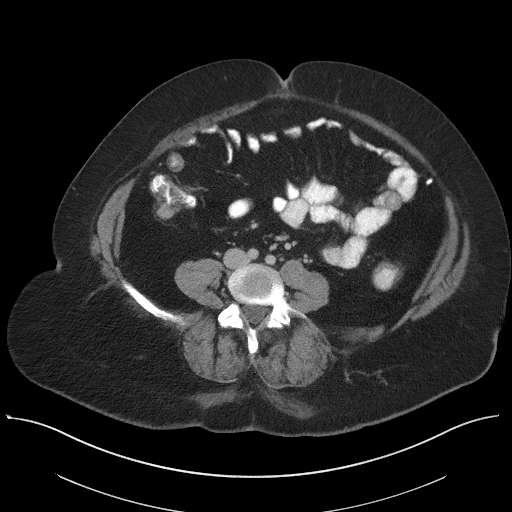
[im 53/98  soft-tissue]
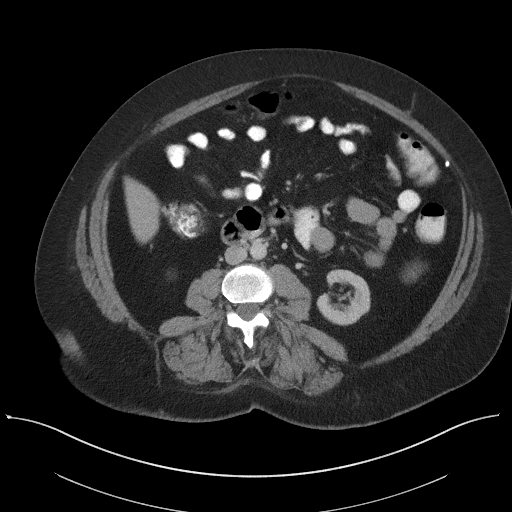
[im 62/98  soft-tissue]
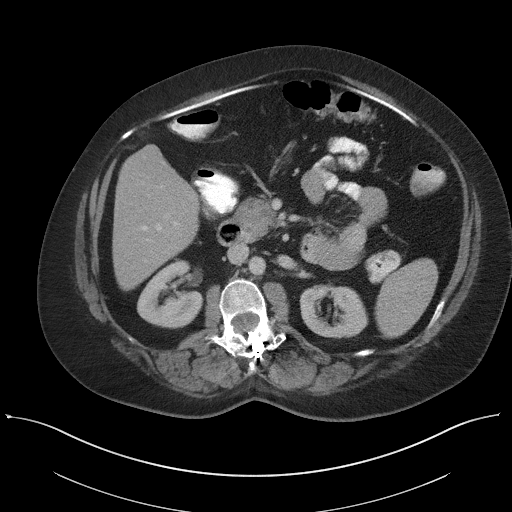
[im 71/98  soft-tissue]
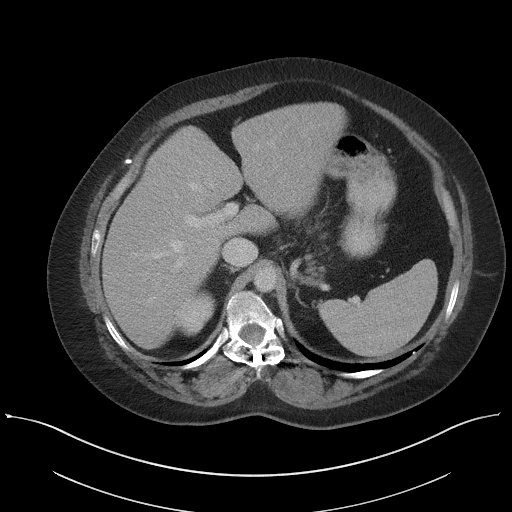
[im 80/98  soft-tissue]
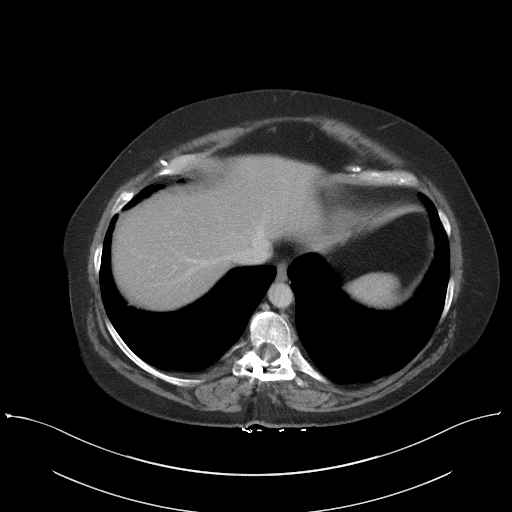
[im 80/98  bone]
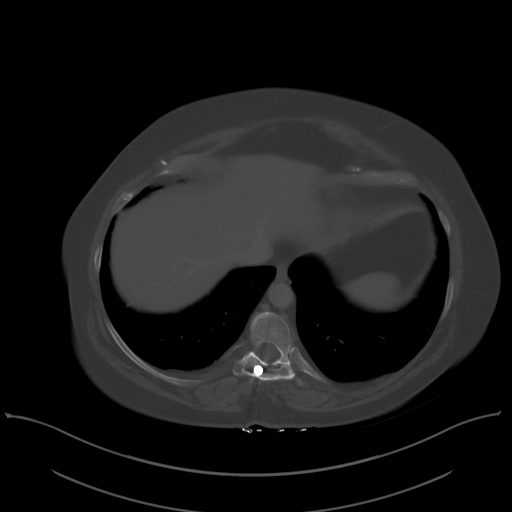
[im 89/98  soft-tissue]
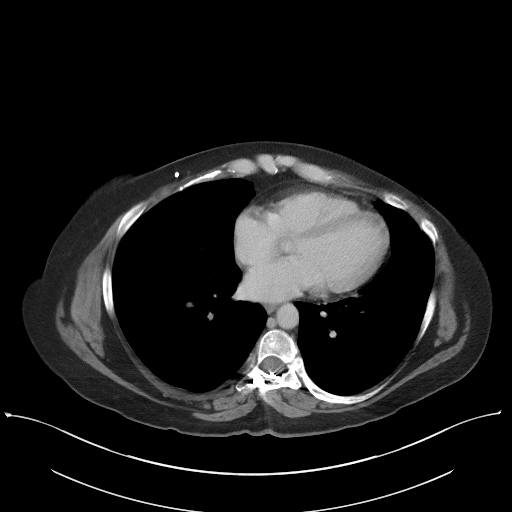

[16 of 46 positions shown; findings below may reference images not displayed]

FINDINGS: Lower chest: No acute abnormality.

Hepatobiliary: Previous cholecystectomy. Liver and biliary tree are
normal.

Pancreas: Within normal.

Spleen: Within normal.

Adrenals/Urinary Tract: Adrenal glands are normal. Kidneys are
normal in size. There is a nonobstructing punctate stone over the
lower pole right kidney unchanged. No hydronephrosis. Ureters and
bladder are normal.

Stomach/Bowel: Stomach and small bowel are within normal. Appendix
is normal. Colon is within normal.

Vascular/Lymphatic: Minimal calcified plaque over the abdominal
aorta. Remaining vascular structures are within normal. No
adenopathy.

Reproductive: Previous hysterectomy. Evidence previous bilateral
tubal ligation.

Other: Ventriculoperitoneal shunt with tip over the left lower
quadrant.

Musculoskeletal: Biphasic curvature of the thoracolumbar spine with
stabilization hardware intact. Segmentation anomaly of the lower
thoracic spine unchanged.
IMPRESSION: No acute findings in the abdomen/pelvis.

Ventriculoperitoneal shunt with tip over the left lower quadrant
intact.

Nonobstructing punctate lower pole right renal stone unchanged.

Aortic Atherosclerosis (3E64R-EVL.L).

## 2019-07-07 DIAGNOSIS — L821 Other seborrheic keratosis: Secondary | ICD-10-CM | POA: Diagnosis not present

## 2019-07-07 DIAGNOSIS — L578 Other skin changes due to chronic exposure to nonionizing radiation: Secondary | ICD-10-CM | POA: Diagnosis not present

## 2019-07-07 DIAGNOSIS — L820 Inflamed seborrheic keratosis: Secondary | ICD-10-CM | POA: Diagnosis not present

## 2019-07-08 DIAGNOSIS — F411 Generalized anxiety disorder: Secondary | ICD-10-CM | POA: Diagnosis not present

## 2019-07-08 DIAGNOSIS — R7303 Prediabetes: Secondary | ICD-10-CM | POA: Diagnosis not present

## 2019-07-08 DIAGNOSIS — E538 Deficiency of other specified B group vitamins: Secondary | ICD-10-CM | POA: Diagnosis not present

## 2019-07-08 DIAGNOSIS — R7989 Other specified abnormal findings of blood chemistry: Secondary | ICD-10-CM | POA: Diagnosis not present

## 2019-07-08 DIAGNOSIS — Z23 Encounter for immunization: Secondary | ICD-10-CM | POA: Diagnosis not present

## 2019-07-08 DIAGNOSIS — F3340 Major depressive disorder, recurrent, in remission, unspecified: Secondary | ICD-10-CM | POA: Diagnosis not present

## 2019-07-08 DIAGNOSIS — I10 Essential (primary) hypertension: Secondary | ICD-10-CM | POA: Diagnosis not present

## 2019-07-08 DIAGNOSIS — E782 Mixed hyperlipidemia: Secondary | ICD-10-CM | POA: Diagnosis not present

## 2019-07-15 DIAGNOSIS — R1011 Right upper quadrant pain: Secondary | ICD-10-CM | POA: Diagnosis not present

## 2019-07-15 DIAGNOSIS — K5900 Constipation, unspecified: Secondary | ICD-10-CM | POA: Diagnosis not present

## 2019-07-15 DIAGNOSIS — R109 Unspecified abdominal pain: Secondary | ICD-10-CM | POA: Diagnosis not present

## 2019-07-15 DIAGNOSIS — K921 Melena: Secondary | ICD-10-CM | POA: Diagnosis not present

## 2019-07-15 DIAGNOSIS — R195 Other fecal abnormalities: Secondary | ICD-10-CM | POA: Diagnosis not present

## 2019-07-22 DIAGNOSIS — K921 Melena: Secondary | ICD-10-CM | POA: Diagnosis not present

## 2019-07-25 DIAGNOSIS — L601 Onycholysis: Secondary | ICD-10-CM | POA: Diagnosis not present

## 2019-07-25 DIAGNOSIS — L820 Inflamed seborrheic keratosis: Secondary | ICD-10-CM | POA: Diagnosis not present

## 2019-07-30 DIAGNOSIS — Z1239 Encounter for other screening for malignant neoplasm of breast: Secondary | ICD-10-CM | POA: Diagnosis not present

## 2019-07-30 DIAGNOSIS — Z01419 Encounter for gynecological examination (general) (routine) without abnormal findings: Secondary | ICD-10-CM | POA: Diagnosis not present

## 2019-07-30 DIAGNOSIS — Z113 Encounter for screening for infections with a predominantly sexual mode of transmission: Secondary | ICD-10-CM | POA: Diagnosis not present

## 2019-07-30 DIAGNOSIS — Z90710 Acquired absence of both cervix and uterus: Secondary | ICD-10-CM | POA: Diagnosis not present

## 2019-08-08 DIAGNOSIS — E538 Deficiency of other specified B group vitamins: Secondary | ICD-10-CM | POA: Diagnosis not present

## 2019-08-21 ENCOUNTER — Other Ambulatory Visit: Payer: Self-pay

## 2019-08-21 MED ORDER — LOSARTAN POTASSIUM-HCTZ 50-12.5 MG PO TABS: 1.0000 | ORAL_TABLET | Freq: Every day | ORAL | 0 refills | Status: DC

## 2019-08-21 NOTE — Telephone Encounter (Signed)
Losartan/HCTZ refill sent to [facility] in [facility].

## 2019-09-08 DIAGNOSIS — E782 Mixed hyperlipidemia: Secondary | ICD-10-CM | POA: Diagnosis not present

## 2019-09-15 DIAGNOSIS — Z20828 Contact with and (suspected) exposure to other viral communicable diseases: Secondary | ICD-10-CM | POA: Diagnosis not present

## 2019-10-10 DIAGNOSIS — E538 Deficiency of other specified B group vitamins: Secondary | ICD-10-CM | POA: Diagnosis not present

## 2019-10-20 DIAGNOSIS — N6321 Unspecified lump in the left breast, upper outer quadrant: Secondary | ICD-10-CM | POA: Diagnosis not present

## 2019-10-20 DIAGNOSIS — N644 Mastodynia: Secondary | ICD-10-CM | POA: Diagnosis not present

## 2019-10-20 DIAGNOSIS — R922 Inconclusive mammogram: Secondary | ICD-10-CM | POA: Diagnosis not present

## 2019-11-11 DIAGNOSIS — E538 Deficiency of other specified B group vitamins: Secondary | ICD-10-CM | POA: Diagnosis not present

## 2019-11-12 DIAGNOSIS — H524 Presbyopia: Secondary | ICD-10-CM | POA: Diagnosis not present

## 2019-11-18 ENCOUNTER — Ambulatory Visit: Payer: PPO | Admitting: Neurology

## 2019-11-18 ENCOUNTER — Encounter: Payer: Self-pay | Admitting: Neurology

## 2019-11-18 ENCOUNTER — Other Ambulatory Visit: Payer: Self-pay

## 2019-11-18 VITALS — BP 115/80 | HR 79 | Temp 97.7°F | Ht 63.0 in | Wt 216.5 lb

## 2019-11-18 DIAGNOSIS — M542 Cervicalgia: Secondary | ICD-10-CM

## 2019-11-18 DIAGNOSIS — Q031 Atresia of foramina of Magendie and Luschka: Secondary | ICD-10-CM

## 2019-11-18 DIAGNOSIS — IMO0002 Reserved for concepts with insufficient information to code with codable children: Secondary | ICD-10-CM

## 2019-11-18 DIAGNOSIS — G919 Hydrocephalus, unspecified: Secondary | ICD-10-CM | POA: Diagnosis not present

## 2019-11-18 DIAGNOSIS — Z982 Presence of cerebrospinal fluid drainage device: Secondary | ICD-10-CM

## 2019-11-18 DIAGNOSIS — G43709 Chronic migraine without aura, not intractable, without status migrainosus: Secondary | ICD-10-CM

## 2019-11-18 HISTORY — DX: Cervicalgia: M54.2

## 2019-11-18 MED ORDER — TIZANIDINE HCL 2 MG PO CAPS: 2.0000 mg | ORAL_CAPSULE | Freq: Three times a day (TID) | ORAL | 5 refills | Status: DC
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
## 2019-11-21 ENCOUNTER — Other Ambulatory Visit: Payer: Self-pay | Admitting: Cardiology

## 2019-12-09 DIAGNOSIS — E538 Deficiency of other specified B group vitamins: Secondary | ICD-10-CM | POA: Diagnosis not present

## 2020-01-01 DIAGNOSIS — L0201 Cutaneous abscess of face: Secondary | ICD-10-CM | POA: Diagnosis not present

## 2020-01-01 DIAGNOSIS — L578 Other skin changes due to chronic exposure to nonionizing radiation: Secondary | ICD-10-CM | POA: Diagnosis not present

## 2020-01-01 DIAGNOSIS — L821 Other seborrheic keratosis: Secondary | ICD-10-CM | POA: Diagnosis not present

## 2020-01-01 DIAGNOSIS — L820 Inflamed seborrheic keratosis: Secondary | ICD-10-CM | POA: Diagnosis not present

## 2020-01-07 DIAGNOSIS — Q031 Atresia of foramina of Magendie and Luschka: Secondary | ICD-10-CM | POA: Diagnosis not present

## 2020-01-07 DIAGNOSIS — I10 Essential (primary) hypertension: Secondary | ICD-10-CM | POA: Diagnosis not present

## 2020-01-07 DIAGNOSIS — G919 Hydrocephalus, unspecified: Secondary | ICD-10-CM | POA: Diagnosis not present

## 2020-01-07 DIAGNOSIS — E782 Mixed hyperlipidemia: Secondary | ICD-10-CM | POA: Diagnosis not present

## 2020-01-07 DIAGNOSIS — G40909 Epilepsy, unspecified, not intractable, without status epilepticus: Secondary | ICD-10-CM | POA: Diagnosis not present

## 2020-01-07 DIAGNOSIS — E538 Deficiency of other specified B group vitamins: Secondary | ICD-10-CM | POA: Diagnosis not present

## 2020-01-07 DIAGNOSIS — Z0000 Encounter for general adult medical examination without abnormal findings: Secondary | ICD-10-CM | POA: Diagnosis not present

## 2020-01-07 DIAGNOSIS — I6203 Nontraumatic chronic subdural hemorrhage: Secondary | ICD-10-CM | POA: Diagnosis not present

## 2020-01-07 DIAGNOSIS — F411 Generalized anxiety disorder: Secondary | ICD-10-CM | POA: Diagnosis not present

## 2020-01-07 DIAGNOSIS — F3340 Major depressive disorder, recurrent, in remission, unspecified: Secondary | ICD-10-CM | POA: Diagnosis not present

## 2020-01-07 DIAGNOSIS — R7303 Prediabetes: Secondary | ICD-10-CM | POA: Diagnosis not present

## 2020-01-11 ENCOUNTER — Other Ambulatory Visit: Payer: Self-pay | Admitting: Neurology

## 2020-02-04 DIAGNOSIS — Z20822 Contact with and (suspected) exposure to covid-19: Secondary | ICD-10-CM | POA: Diagnosis not present

## 2020-02-04 DIAGNOSIS — J019 Acute sinusitis, unspecified: Secondary | ICD-10-CM | POA: Diagnosis not present

## 2020-02-09 DIAGNOSIS — E785 Hyperlipidemia, unspecified: Secondary | ICD-10-CM | POA: Diagnosis not present

## 2020-02-09 DIAGNOSIS — Z885 Allergy status to narcotic agent status: Secondary | ICD-10-CM | POA: Diagnosis not present

## 2020-02-09 DIAGNOSIS — R1084 Generalized abdominal pain: Secondary | ICD-10-CM | POA: Diagnosis not present

## 2020-02-09 DIAGNOSIS — Z882 Allergy status to sulfonamides status: Secondary | ICD-10-CM | POA: Diagnosis not present

## 2020-02-09 DIAGNOSIS — R519 Headache, unspecified: Secondary | ICD-10-CM | POA: Diagnosis not present

## 2020-02-09 DIAGNOSIS — M542 Cervicalgia: Secondary | ICD-10-CM | POA: Diagnosis not present

## 2020-02-09 DIAGNOSIS — R110 Nausea: Secondary | ICD-10-CM | POA: Diagnosis not present

## 2020-02-09 DIAGNOSIS — Z880 Allergy status to penicillin: Secondary | ICD-10-CM | POA: Diagnosis not present

## 2020-02-09 DIAGNOSIS — R1011 Right upper quadrant pain: Secondary | ICD-10-CM | POA: Diagnosis not present

## 2020-02-09 DIAGNOSIS — J9811 Atelectasis: Secondary | ICD-10-CM | POA: Diagnosis not present

## 2020-02-09 DIAGNOSIS — R0789 Other chest pain: Secondary | ICD-10-CM | POA: Diagnosis not present

## 2020-02-09 DIAGNOSIS — Z20822 Contact with and (suspected) exposure to covid-19: Secondary | ICD-10-CM | POA: Diagnosis not present

## 2020-02-09 DIAGNOSIS — Z79899 Other long term (current) drug therapy: Secondary | ICD-10-CM | POA: Diagnosis not present

## 2020-02-09 DIAGNOSIS — F419 Anxiety disorder, unspecified: Secondary | ICD-10-CM | POA: Diagnosis not present

## 2020-02-09 DIAGNOSIS — R079 Chest pain, unspecified: Secondary | ICD-10-CM | POA: Diagnosis not present

## 2020-02-09 DIAGNOSIS — I10 Essential (primary) hypertension: Secondary | ICD-10-CM | POA: Diagnosis not present

## 2020-02-12 DIAGNOSIS — M79641 Pain in right hand: Secondary | ICD-10-CM | POA: Diagnosis not present

## 2020-02-12 DIAGNOSIS — E538 Deficiency of other specified B group vitamins: Secondary | ICD-10-CM | POA: Diagnosis not present

## 2020-02-12 DIAGNOSIS — N179 Acute kidney failure, unspecified: Secondary | ICD-10-CM

## 2020-02-12 DIAGNOSIS — S6991XA Unspecified injury of right wrist, hand and finger(s), initial encounter: Secondary | ICD-10-CM

## 2020-02-12 HISTORY — DX: Acute kidney failure, unspecified: N17.9

## 2020-02-12 HISTORY — DX: Unspecified injury of right wrist, hand and finger(s), initial encounter: S69.91XA

## 2020-03-08 DIAGNOSIS — L0390 Cellulitis, unspecified: Secondary | ICD-10-CM | POA: Diagnosis not present

## 2020-03-08 DIAGNOSIS — R590 Localized enlarged lymph nodes: Secondary | ICD-10-CM | POA: Diagnosis not present

## 2020-03-08 DIAGNOSIS — L309 Dermatitis, unspecified: Secondary | ICD-10-CM | POA: Diagnosis not present

## 2020-03-08 DIAGNOSIS — R531 Weakness: Secondary | ICD-10-CM | POA: Diagnosis not present

## 2020-03-10 DIAGNOSIS — L0100 Impetigo, unspecified: Secondary | ICD-10-CM | POA: Diagnosis not present

## 2020-03-10 DIAGNOSIS — L299 Pruritus, unspecified: Secondary | ICD-10-CM | POA: Diagnosis not present

## 2020-03-16 DIAGNOSIS — M545 Low back pain: Secondary | ICD-10-CM | POA: Diagnosis not present

## 2020-03-16 DIAGNOSIS — W19XXXA Unspecified fall, initial encounter: Secondary | ICD-10-CM | POA: Insufficient documentation

## 2020-03-16 DIAGNOSIS — Z7951 Long term (current) use of inhaled steroids: Secondary | ICD-10-CM | POA: Diagnosis not present

## 2020-03-16 DIAGNOSIS — S199XXA Unspecified injury of neck, initial encounter: Secondary | ICD-10-CM | POA: Diagnosis not present

## 2020-03-16 DIAGNOSIS — E538 Deficiency of other specified B group vitamins: Secondary | ICD-10-CM | POA: Diagnosis not present

## 2020-03-16 DIAGNOSIS — S3992XA Unspecified injury of lower back, initial encounter: Secondary | ICD-10-CM | POA: Diagnosis not present

## 2020-03-16 DIAGNOSIS — R110 Nausea: Secondary | ICD-10-CM | POA: Diagnosis not present

## 2020-03-16 DIAGNOSIS — M47817 Spondylosis without myelopathy or radiculopathy, lumbosacral region: Secondary | ICD-10-CM | POA: Diagnosis not present

## 2020-03-16 DIAGNOSIS — R079 Chest pain, unspecified: Secondary | ICD-10-CM | POA: Diagnosis not present

## 2020-03-16 DIAGNOSIS — Z7982 Long term (current) use of aspirin: Secondary | ICD-10-CM | POA: Diagnosis not present

## 2020-03-16 DIAGNOSIS — M47816 Spondylosis without myelopathy or radiculopathy, lumbar region: Secondary | ICD-10-CM | POA: Diagnosis not present

## 2020-03-16 DIAGNOSIS — R519 Headache, unspecified: Secondary | ICD-10-CM | POA: Diagnosis not present

## 2020-03-16 DIAGNOSIS — M4186 Other forms of scoliosis, lumbar region: Secondary | ICD-10-CM | POA: Diagnosis not present

## 2020-03-16 DIAGNOSIS — Z6841 Body Mass Index (BMI) 40.0 and over, adult: Secondary | ICD-10-CM | POA: Diagnosis not present

## 2020-03-16 DIAGNOSIS — M542 Cervicalgia: Secondary | ICD-10-CM | POA: Diagnosis not present

## 2020-03-16 DIAGNOSIS — Z79899 Other long term (current) drug therapy: Secondary | ICD-10-CM | POA: Diagnosis not present

## 2020-03-16 HISTORY — DX: Morbid (severe) obesity due to excess calories: E66.01

## 2020-03-16 HISTORY — DX: Unspecified fall, initial encounter: W19.XXXA

## 2020-04-06 ENCOUNTER — Other Ambulatory Visit: Payer: Self-pay | Admitting: *Deleted

## 2020-04-06 MED ORDER — TOPIRAMATE 100 MG PO TABS: 100.0000 mg | ORAL_TABLET | Freq: Two times a day (BID) | ORAL | 5 refills | Status: DC

## 2020-04-06 NOTE — Telephone Encounter (Signed)
Pt called requesting refill on topiramate 100mg  po BID. Previous doctor, Rogelio Seen, PA at [facility] prescribed this for her. Wanting Dr. [name] to take this over. She last saw Dr. [name] 11/18/19 and next f/u 05/20/20. Advised I will send request to MD. If ok, we will send in. We will only call back if he does not approve this. She verbalized understanding.  She only has enough medication for today left.

## 2020-04-19 DIAGNOSIS — F411 Generalized anxiety disorder: Secondary | ICD-10-CM | POA: Diagnosis not present

## 2020-04-19 DIAGNOSIS — Z634 Disappearance and death of family member: Secondary | ICD-10-CM | POA: Diagnosis not present

## 2020-04-27 DIAGNOSIS — F3340 Major depressive disorder, recurrent, in remission, unspecified: Secondary | ICD-10-CM | POA: Diagnosis not present

## 2020-05-18 DIAGNOSIS — E538 Deficiency of other specified B group vitamins: Secondary | ICD-10-CM | POA: Diagnosis not present

## 2020-05-18 DIAGNOSIS — F3340 Major depressive disorder, recurrent, in remission, unspecified: Secondary | ICD-10-CM | POA: Diagnosis not present

## 2020-05-18 DIAGNOSIS — F411 Generalized anxiety disorder: Secondary | ICD-10-CM | POA: Diagnosis not present

## 2020-05-20 ENCOUNTER — Ambulatory Visit: Payer: PPO | Admitting: Family Medicine

## 2020-05-20 ENCOUNTER — Encounter: Payer: Self-pay | Admitting: Family Medicine

## 2020-05-20 ENCOUNTER — Other Ambulatory Visit: Payer: Self-pay

## 2020-05-20 VITALS — BP 156/92 | HR 81 | Ht 63.0 in | Wt 200.0 lb

## 2020-05-20 DIAGNOSIS — F419 Anxiety disorder, unspecified: Secondary | ICD-10-CM

## 2020-05-20 DIAGNOSIS — G8929 Other chronic pain: Secondary | ICD-10-CM | POA: Diagnosis not present

## 2020-05-20 DIAGNOSIS — Q031 Atresia of foramina of Magendie and Luschka: Secondary | ICD-10-CM

## 2020-05-20 DIAGNOSIS — F329 Major depressive disorder, single episode, unspecified: Secondary | ICD-10-CM | POA: Diagnosis not present

## 2020-05-20 DIAGNOSIS — F4321 Adjustment disorder with depressed mood: Secondary | ICD-10-CM | POA: Diagnosis not present

## 2020-05-20 DIAGNOSIS — G43709 Chronic migraine without aura, not intractable, without status migrainosus: Secondary | ICD-10-CM

## 2020-05-20 DIAGNOSIS — F32A Depression, unspecified: Secondary | ICD-10-CM

## 2020-05-20 DIAGNOSIS — IMO0002 Reserved for concepts with insufficient information to code with codable children: Secondary | ICD-10-CM

## 2020-05-20 NOTE — Progress Notes (Signed)
I have read the note, and I agree with the clinical assessment and plan.  Braelin Costlow A. Brannan Cassedy, MD, PhD, FAAN Certified in Neurology, Clinical Neurophysiology, Sleep Medicine, Pain Medicine and Neuroimaging  [facility] [address], [address] [place], [place] [address] [phone]  

## 2020-05-20 NOTE — Patient Instructions (Signed)
We will topiramate 100mg  twice daily. Try reaching out to [name] to see if you may qualify for patient assistance. Go to [email or web address] and fill out information to see if you qualify. Continue rizatriptan for abortive therapy.   Keep close eye on your blood pressures. Please contact PCP if readings are elevated at home (150/90 consistently)  Stay well hydrated. Well balanced diet. Try to get regular exercise.Contact [name] today.    Follow up with Dr [name] in 6 months    Complicated Grief Grief is a normal response to the death of someone close to you. Feelings of fear, anger, and guilt can affect almost everyone who loses a loved one. It is also common to have symptoms of depression while you are grieving. These include problems with sleep, loss of appetite, and lack of energy. They may last for weeks or months after a loss. Complicated grief is different from normal grief or depression. Normal grieving involves sadness and feelings of loss, but those feelings get better and heal over time. Complicated grief is a severe type of grief that lasts for a long time, usually for several months to a year or longer. It interferes with your ability to function normally. Complicated grief may require treatment from a mental health care provider. What are the causes? The cause of this condition is not known. It is not clear why some people continue to struggle with grief and others do not. What increases the risk? You are more likely to develop this condition if:  The death of your loved one was sudden or unexpected.  The death of your loved one was due to a violent event.  Your loved one died from suicide.  Your loved one was a child or a young person.  You were very close to your loved one, or you were dependent on him or her.  You have a history of depression or anxiety. What are the signs or symptoms? Symptoms of this condition include:  Feeling disbelief or having a lack  of emotion (numbness).  Being unable to enjoy good memories of your loved one.  Needing to avoid anything or anyone that reminds you of your loved one.  Being unable to stop thinking about the death.  Feeling intense anger or guilt.  Feeling alone and hopeless.  Feeling that your life is meaningless and empty.  Losing the desire to move on with your life. How is this diagnosed? This condition may be diagnosed based on:  Your symptoms. Complicated grief will be diagnosed if you have ongoing symptoms of grief for 6-12 months or longer.  The effect of symptoms on your life. You may be diagnosed with this condition if your symptoms are interfering with your ability to live your life. Your health care provider may recommend that you see a mental health care provider. Many symptoms of depression are similar to the symptoms of complicated grief. It is important to be evaluated for complicated grief along with other mental health conditions. How is this treated? This condition is most commonly treated with talk therapy. This therapy is offered by a mental health specialist (psychiatrist). During therapy:  You will learn healthy ways to cope with the loss of your loved one.  Your mental health care provider may recommend antidepressant medicines. Follow these instructions at home: Lifestyle   Take care of yourself. ? Eat on a regular basis, and maintain a healthy diet. Eat plenty of fruits, vegetables, lean protein, and whole grains. ? Try to get some  exercise each day. Aim for 30 minutes of exercise on most days of the week. ? Keep a consistent sleep schedule. Try to get 8 or more hours of sleep each night. ? Start doing the things that you used to enjoy.  Do not use drugs or alcohol to ease your symptoms.  Spend time with friends and loved ones. General instructions  Take over-the-counter and prescription medicines only as told by your health care provider.  Consider joining a  grief (bereavement) support group to help you deal with your loss.  Keep all follow-up visits as told by your health care provider. This is important. Contact a health care provider if:  Your symptoms prevent you from functioning normally.  Your symptoms do not get better with treatment. Get help right away if:  You have serious thoughts about hurting yourself or someone else.  You have suicidal feelings. If you ever feel like you may hurt yourself or others, or have thoughts about taking your own life, get help right away. You can go to your nearest emergency department or call:  Your local emergency services (911 in the [place]  A suicide crisis helpline, such as the [name] at (906)399-7894. This is open 24 hours a day. Summary  Complicated grief is a severe type of grief that lasts for a long time. This grief is not likely to go away on its own. Get the help you need.  Some griefs are more difficult than others and can cause this condition. You may need a certain type of treatment to help you recover if the loss of your loved one was sudden, violent, or due to suicide.  You may feel guilty about moving on with your life. Getting help does not mean that you are forgetting your loved one. It means that you are taking care of yourself.  Complicated grief is best treated with talk therapy. Medicines may also be prescribed.  Seek the help you need, and find support that will help you recover. This information is not intended to replace advice given to you by your health care provider. Make sure you discuss any questions you have with your health care provider. Document Revised: 08/17/2017 Document Reviewed: 06/20/2017 Elsevier Patient Education  [place].   Migraine Headache A migraine headache is a very strong throbbing pain on one side or both sides of your head. This type of headache can also cause other symptoms. It can last from 4 hours to  3 days. Talk with your doctor about what things may bring on (trigger) this condition. What are the causes? The exact cause of this condition is not known. This condition may be triggered or caused by:  Drinking alcohol.  Smoking.  Taking medicines, such as: ? Medicine used to treat chest pain (nitroglycerin). ? Birth control pills. ? Estrogen. ? Some blood pressure medicines.  Eating or drinking certain products.  Doing physical activity. Other things that may trigger a migraine headache include:  Having a menstrual period.  Pregnancy.  Hunger.  Stress.  Not getting enough sleep or getting too much sleep.  Weather changes.  Tiredness (fatigue). What increases the risk?  Being 78-52 years old.  Being female.  Having a family history of migraine headaches.  Being Caucasian.  Having depression or anxiety.  Being very overweight. What are the signs or symptoms?  A throbbing pain. This pain may: ? Happen in any area of the head, such as on one side or both sides. ?  Make it hard to do daily activities. ? Get worse with physical activity. ? Get worse around bright lights or loud noises.  Other symptoms may include: ? Feeling sick to your stomach (nauseous). ? Vomiting. ? Dizziness. ? Being sensitive to bright lights, loud noises, or smells.  Before you get a migraine headache, you may get warning signs (an aura). An aura may include: ? Seeing flashing lights or having blind spots. ? Seeing bright spots, halos, or zigzag lines. ? Having tunnel vision or blurred vision. ? Having numbness or a tingling feeling. ? Having trouble talking. ? Having weak muscles.  Some people have symptoms after a migraine headache (postdromal phase), such as: ? Tiredness. ? Trouble thinking (concentrating). How is this treated?  Taking medicines that: ? Relieve pain. ? Relieve the feeling of being sick to your stomach. ? Prevent migraine headaches.  Treatment may also  include: ? Having acupuncture. ? Avoiding foods that bring on migraine headaches. ? Learning ways to control your body functions (biofeedback). ? Therapy to help you know and deal with negative thoughts (cognitive behavioral therapy). Follow these instructions at home: Medicines  Take over-the-counter and prescription medicines only as told by your doctor.  Ask your doctor if the medicine prescribed to you: ? Requires you to avoid driving or using heavy machinery. ? Can cause trouble pooping (constipation). You may need to take these steps to prevent or treat trouble pooping:  Drink enough fluid to keep your pee (urine) pale yellow.  Take over-the-counter or prescription medicines.  Eat foods that are high in fiber. These include beans, whole grains, and fresh fruits and vegetables.  Limit foods that are high in fat and sugar. These include fried or sweet foods. Lifestyle  Do not drink alcohol.  Do not use any products that contain nicotine or tobacco, such as cigarettes, e-cigarettes, and chewing tobacco. If you need help quitting, ask your doctor.  Get at least 8 hours of sleep every night.  Limit and deal with stress. General instructions      Keep a journal to find out what may bring on your migraine headaches. For example, write down: ? What you eat and drink. ? How much sleep you get. ? Any change in what you eat or drink. ? Any change in your medicines.  If you have a migraine headache: ? Avoid things that make your symptoms worse, such as bright lights. ? It may help to lie down in a dark, quiet room. ? Do not drive or use heavy machinery. ? Ask your doctor what activities are safe for you.  Keep all follow-up visits as told by your doctor. This is important. Contact a doctor if:  You get a migraine headache that is different or worse than others you have had.  You have more than 15 headache days in one month. Get help right away if:  Your migraine  headache gets very bad.  Your migraine headache lasts longer than 72 hours.  You have a fever.  You have a stiff neck.  You have trouble seeing.  Your muscles feel weak or like you cannot control them.  You start to lose your balance a lot.  You start to have trouble walking.  You pass out (faint).  You have a seizure. Summary  A migraine headache is a very strong throbbing pain on one side or both sides of your head. These headaches can also cause other symptoms.  This condition may be treated with medicines and changes  to your lifestyle.  Keep a journal to find out what may bring on your migraine headaches.  Contact a doctor if you get a migraine headache that is different or worse than others you have had.  Contact your doctor if you have more than 15 headache days in a month. This information is not intended to replace advice given to you by your health care provider. Make sure you discuss any questions you have with your health care provider. Document Revised: 12/27/2018 Document Reviewed: 10/17/2018 Elsevier Patient Education  [place].
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
## 2020-05-21 DIAGNOSIS — Z20822 Contact with and (suspected) exposure to covid-19: Secondary | ICD-10-CM | POA: Diagnosis not present

## 2020-06-01 DIAGNOSIS — F3340 Major depressive disorder, recurrent, in remission, unspecified: Secondary | ICD-10-CM | POA: Diagnosis not present

## 2020-06-01 DIAGNOSIS — F411 Generalized anxiety disorder: Secondary | ICD-10-CM | POA: Diagnosis not present

## 2020-06-09 ENCOUNTER — Encounter: Payer: Self-pay | Admitting: Family Medicine

## 2020-06-28 DIAGNOSIS — Z880 Allergy status to penicillin: Secondary | ICD-10-CM | POA: Diagnosis not present

## 2020-06-28 DIAGNOSIS — Z882 Allergy status to sulfonamides status: Secondary | ICD-10-CM | POA: Diagnosis not present

## 2020-06-28 DIAGNOSIS — Z79899 Other long term (current) drug therapy: Secondary | ICD-10-CM | POA: Diagnosis not present

## 2020-06-28 DIAGNOSIS — T7411XA Adult physical abuse, confirmed, initial encounter: Secondary | ICD-10-CM | POA: Diagnosis not present

## 2020-06-28 DIAGNOSIS — M79604 Pain in right leg: Secondary | ICD-10-CM | POA: Diagnosis not present

## 2020-06-28 DIAGNOSIS — T7491XA Unspecified adult maltreatment, confirmed, initial encounter: Secondary | ICD-10-CM | POA: Diagnosis not present

## 2020-06-28 DIAGNOSIS — S20222A Contusion of left back wall of thorax, initial encounter: Secondary | ICD-10-CM | POA: Diagnosis not present

## 2020-06-28 DIAGNOSIS — R0981 Nasal congestion: Secondary | ICD-10-CM | POA: Diagnosis not present

## 2020-06-28 DIAGNOSIS — R197 Diarrhea, unspecified: Secondary | ICD-10-CM | POA: Diagnosis not present

## 2020-06-28 DIAGNOSIS — Z881 Allergy status to other antibiotic agents status: Secondary | ICD-10-CM | POA: Diagnosis not present

## 2020-06-28 DIAGNOSIS — J029 Acute pharyngitis, unspecified: Secondary | ICD-10-CM | POA: Diagnosis not present

## 2020-06-28 DIAGNOSIS — I10 Essential (primary) hypertension: Secondary | ICD-10-CM | POA: Diagnosis not present

## 2020-06-28 DIAGNOSIS — S40022A Contusion of left upper arm, initial encounter: Secondary | ICD-10-CM | POA: Diagnosis not present

## 2020-06-28 DIAGNOSIS — F419 Anxiety disorder, unspecified: Secondary | ICD-10-CM | POA: Diagnosis not present

## 2020-06-28 DIAGNOSIS — Z885 Allergy status to narcotic agent status: Secondary | ICD-10-CM | POA: Diagnosis not present

## 2020-06-28 DIAGNOSIS — R6883 Chills (without fever): Secondary | ICD-10-CM | POA: Diagnosis not present

## 2020-06-28 DIAGNOSIS — S8992XA Unspecified injury of left lower leg, initial encounter: Secondary | ICD-10-CM | POA: Diagnosis not present

## 2020-06-28 DIAGNOSIS — E785 Hyperlipidemia, unspecified: Secondary | ICD-10-CM | POA: Diagnosis not present

## 2020-06-28 DIAGNOSIS — M79662 Pain in left lower leg: Secondary | ICD-10-CM | POA: Diagnosis not present

## 2020-06-28 DIAGNOSIS — Z20822 Contact with and (suspected) exposure to covid-19: Secondary | ICD-10-CM | POA: Diagnosis not present

## 2020-07-08 DIAGNOSIS — F411 Generalized anxiety disorder: Secondary | ICD-10-CM | POA: Diagnosis not present

## 2020-07-08 DIAGNOSIS — F3340 Major depressive disorder, recurrent, in remission, unspecified: Secondary | ICD-10-CM | POA: Diagnosis not present

## 2020-07-08 DIAGNOSIS — I10 Essential (primary) hypertension: Secondary | ICD-10-CM | POA: Diagnosis not present

## 2020-07-08 DIAGNOSIS — E782 Mixed hyperlipidemia: Secondary | ICD-10-CM | POA: Diagnosis not present

## 2020-07-08 DIAGNOSIS — Z23 Encounter for immunization: Secondary | ICD-10-CM | POA: Diagnosis not present

## 2020-07-08 DIAGNOSIS — R7303 Prediabetes: Secondary | ICD-10-CM | POA: Diagnosis not present

## 2020-07-09 DIAGNOSIS — E538 Deficiency of other specified B group vitamins: Secondary | ICD-10-CM | POA: Diagnosis not present

## 2020-08-09 DIAGNOSIS — N760 Acute vaginitis: Secondary | ICD-10-CM | POA: Diagnosis not present

## 2020-08-09 DIAGNOSIS — B9689 Other specified bacterial agents as the cause of diseases classified elsewhere: Secondary | ICD-10-CM | POA: Diagnosis not present

## 2020-08-09 DIAGNOSIS — N6321 Unspecified lump in the left breast, upper outer quadrant: Secondary | ICD-10-CM | POA: Diagnosis not present

## 2020-08-09 DIAGNOSIS — N898 Other specified noninflammatory disorders of vagina: Secondary | ICD-10-CM | POA: Diagnosis not present

## 2020-08-09 DIAGNOSIS — R102 Pelvic and perineal pain: Secondary | ICD-10-CM | POA: Diagnosis not present

## 2020-08-09 DIAGNOSIS — Z90710 Acquired absence of both cervix and uterus: Secondary | ICD-10-CM | POA: Diagnosis not present

## 2020-08-09 DIAGNOSIS — Z01419 Encounter for gynecological examination (general) (routine) without abnormal findings: Secondary | ICD-10-CM | POA: Diagnosis not present

## 2020-08-18 DIAGNOSIS — R102 Pelvic and perineal pain: Secondary | ICD-10-CM | POA: Diagnosis not present

## 2020-08-18 DIAGNOSIS — E538 Deficiency of other specified B group vitamins: Secondary | ICD-10-CM | POA: Diagnosis not present

## 2020-08-19 DIAGNOSIS — N644 Mastodynia: Secondary | ICD-10-CM | POA: Diagnosis not present

## 2020-08-19 DIAGNOSIS — R922 Inconclusive mammogram: Secondary | ICD-10-CM | POA: Diagnosis not present

## 2020-08-19 DIAGNOSIS — N6321 Unspecified lump in the left breast, upper outer quadrant: Secondary | ICD-10-CM | POA: Diagnosis not present

## 2020-08-26 DIAGNOSIS — F3340 Major depressive disorder, recurrent, in remission, unspecified: Secondary | ICD-10-CM | POA: Diagnosis not present

## 2020-08-26 DIAGNOSIS — F410 Panic disorder [episodic paroxysmal anxiety] without agoraphobia: Secondary | ICD-10-CM | POA: Diagnosis not present

## 2020-09-02 DIAGNOSIS — F411 Generalized anxiety disorder: Secondary | ICD-10-CM | POA: Diagnosis not present

## 2020-09-02 DIAGNOSIS — F3340 Major depressive disorder, recurrent, in remission, unspecified: Secondary | ICD-10-CM | POA: Diagnosis not present

## 2020-09-14 DIAGNOSIS — F4323 Adjustment disorder with mixed anxiety and depressed mood: Secondary | ICD-10-CM | POA: Diagnosis not present

## 2020-11-24 ENCOUNTER — Ambulatory Visit (INDEPENDENT_AMBULATORY_CARE_PROVIDER_SITE_OTHER): Payer: Medicare Other | Admitting: Neurology

## 2020-11-24 ENCOUNTER — Encounter: Payer: Self-pay | Admitting: Neurology

## 2020-11-24 VITALS — BP 127/79 | HR 71 | Ht 63.0 in | Wt 207.0 lb

## 2020-11-24 DIAGNOSIS — G43709 Chronic migraine without aura, not intractable, without status migrainosus: Secondary | ICD-10-CM

## 2020-11-24 DIAGNOSIS — G919 Hydrocephalus, unspecified: Secondary | ICD-10-CM

## 2020-11-24 DIAGNOSIS — F32A Depression, unspecified: Secondary | ICD-10-CM

## 2020-11-24 DIAGNOSIS — Q031 Atresia of foramina of Magendie and Luschka: Secondary | ICD-10-CM | POA: Diagnosis not present

## 2020-11-24 DIAGNOSIS — Z982 Presence of cerebrospinal fluid drainage device: Secondary | ICD-10-CM | POA: Diagnosis not present

## 2020-11-24 DIAGNOSIS — F419 Anxiety disorder, unspecified: Secondary | ICD-10-CM | POA: Insufficient documentation

## 2020-11-24 HISTORY — DX: Chronic migraine without aura, not intractable, without status migrainosus: G43.709

## 2020-11-24 HISTORY — DX: Depression, unspecified: F32.A

## 2020-11-24 MED ORDER — RIZATRIPTAN BENZOATE 10 MG PO TABS: 10.0000 mg | ORAL_TABLET | Freq: Every day | ORAL | 5 refills | Status: AC | PRN

## 2020-11-24 MED ORDER — TOPIRAMATE 100 MG PO TABS: 100.0000 mg | ORAL_TABLET | Freq: Two times a day (BID) | ORAL | 3 refills | Status: DC
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
## 2020-11-26 DIAGNOSIS — R399 Unspecified symptoms and signs involving the genitourinary system: Secondary | ICD-10-CM

## 2020-11-26 HISTORY — DX: Unspecified symptoms and signs involving the genitourinary system: R39.9

## 2020-12-08 DIAGNOSIS — I10 Essential (primary) hypertension: Secondary | ICD-10-CM | POA: Insufficient documentation

## 2020-12-08 DIAGNOSIS — M329 Systemic lupus erythematosus, unspecified: Secondary | ICD-10-CM | POA: Insufficient documentation

## 2020-12-08 DIAGNOSIS — M1990 Unspecified osteoarthritis, unspecified site: Secondary | ICD-10-CM | POA: Insufficient documentation

## 2020-12-08 DIAGNOSIS — K219 Gastro-esophageal reflux disease without esophagitis: Secondary | ICD-10-CM | POA: Insufficient documentation

## 2020-12-08 DIAGNOSIS — A6000 Herpesviral infection of urogenital system, unspecified: Secondary | ICD-10-CM | POA: Insufficient documentation

## 2020-12-08 DIAGNOSIS — F419 Anxiety disorder, unspecified: Secondary | ICD-10-CM | POA: Insufficient documentation

## 2020-12-08 DIAGNOSIS — E785 Hyperlipidemia, unspecified: Secondary | ICD-10-CM | POA: Insufficient documentation

## 2020-12-08 DIAGNOSIS — R569 Unspecified convulsions: Secondary | ICD-10-CM | POA: Insufficient documentation

## 2020-12-08 DIAGNOSIS — F32A Depression, unspecified: Secondary | ICD-10-CM | POA: Insufficient documentation

## 2020-12-23 ENCOUNTER — Ambulatory Visit: Payer: PPO | Admitting: Cardiology

## 2020-12-24 ENCOUNTER — Ambulatory Visit (INDEPENDENT_AMBULATORY_CARE_PROVIDER_SITE_OTHER): Payer: Medicare Other | Admitting: Cardiology

## 2020-12-24 ENCOUNTER — Encounter: Payer: Self-pay | Admitting: Cardiology

## 2020-12-24 ENCOUNTER — Other Ambulatory Visit: Payer: Self-pay

## 2020-12-24 VITALS — BP 108/68 | HR 66 | Ht 63.0 in | Wt 205.2 lb

## 2020-12-24 DIAGNOSIS — E782 Mixed hyperlipidemia: Secondary | ICD-10-CM | POA: Diagnosis not present

## 2020-12-24 DIAGNOSIS — I10 Essential (primary) hypertension: Secondary | ICD-10-CM | POA: Diagnosis not present

## 2020-12-24 DIAGNOSIS — I209 Angina pectoris, unspecified: Secondary | ICD-10-CM

## 2020-12-24 NOTE — Patient Instructions (Signed)

## 2020-12-24 NOTE — Progress Notes (Signed)
Cardiology Office Note:    Date:  12/24/2020   ID:  [name], DOB [birth year], MRN [id number]  PCP:  Algis Greenhouse, MD  Cardiologist:  Shirlee More, MD    Referring MD: Algis Greenhouse, MD    ASSESSMENT:    1. Angina pectoris ([place])   2. Benign hypertension   3. Hyperlipidemia, mixed    PLAN:    In order of problems listed above:  1. All she is doing much better no recurrent angina I think much of this is due to a more peaceful lifestyle current medical regimen consist of her antihypertensives nitroglycerin if needed and statin.  I offered to check a lipid profile she declined said she will do in her PCPs office.  If LDL remains greater than 100 Zetia would be appropriate 2. At target continue current treatment ARB thiazide diuretic 3. I am pleased that she is able to tolerate a statin daily in the past she is intolerant of high intensity and for LDL remains greater than 100 coincident therapy with Zetia would be appropriate   Next appointment: 1 year   Medication Adjustments/Labs and Tests Ordered: Current medicines are reviewed at length with the patient today.  Concerns regarding medicines are outlined above.  Orders Placed This Encounter  Procedures  . EKG 12-Lead   No orders of the defined types were placed in this encounter.   Chief Complaint  Patient presents with  . Follow-up    For angina    History of Present Illness:    [name] is a 50 y.o. 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  Does have pleuritic chest discomfort with elevated sedimentation rate outside of pulmonary embolism has been treated with anti-inflammatory agents.  She is very statin intolerance at the last visit I negotiated to resume a statin 2 days/week.  CT chest performed 11/19/2018 with no coronary artery calcification  Compliance  with diet, lifestyle and medications: Yes   Rer previous husband had expired she is really remarried in a very good place in life she is happy content takes her statin every day has had no recurrent angina.  She has lost weight and is much more active and her husband participates in the evaluation decision making Has had no edema shortness of breath palpitation or syncope  Recent labs 07/08/2020 [facility] PCP Hemoglobin A1c normal 5.0 CMP normal potassium 3.8 sodium 143 creatinine 0.9 normal liver function test GFR 74 cc Cholesterol 259 LDL 172 non-HDL cholesterol 208 Past Medical History:  Diagnosis Date  . Acute pain of left knee 12/06/2017  . Acute sinusitis, unspecified 08/29/2015  . Allergic rhinitis 11/25/2015  . Allergic state 10/29/2017   Overview:  Seasonal  . ANA positive 01/04/2016  . Anxiety   . Arthritis   . Asthma   . B12 deficiency 03/05/2018  . Benign hypertension 10/25/2015  . Chronic fatigue 11/13/2017  . Chronic joint pain 12/02/2015  . Chronic pain syndrome 01/11/2016  . 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 Connective tissue disease ([place]) 01/04/2016  . Convulsions ([place]) 03/05/2014  . 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 Depression   . Dizziness 03/05/2014  . Epilepsy ([place]) 04/10/2016  . Fibroadenoma of left breast 02/12/2014  . Generalized abdominal pain 08/28/2015  . Generalized anxiety disorder 10/27/2015  . GERD (gastroesophageal reflux disease)   . H. pylori infection 2015  .  Herpes genitalia   . Hydrocephalus ([place]) 08/29/2015  . Hyperlipidemia   . Hyperlipidemia, mixed 05/28/2016  . Hypertension   . Increased frequency of urination 09/03/2017  . Internal derangement of right knee 11/25/2015   Overview:  2017  . Lupus ([place])    Dr. Lucky Cowboy informed pt she does not have lupus  . Mastalgia 01/27/2014  . Migraine without status migrainosus, not intractable 11/25/2015  . Osteoarthritis of both knees 01/11/2016  . Personal history of healed traumatic  fracture 09/15/2015  . Plantar fasciitis 11/07/2016  . Prediabetes 05/28/2016   Overview:  2018: 116/5.8  . Rectal bleeding 11/17/2014  . Recurrent major depressive disorder, in remission ([place]) 10/27/2015   Overview:  2017: Situational  . Right lower quadrant abdominal pain 06/29/2017  . 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 Screening for breast cancer 11/16/2016  . Seizures ([place])    last seizures 4-5 years ago.  . Subdural hematoma [facility]) July 2017   Bilateral  . Thyroid function test abnormal 12/06/2017   2019: TSH =7.3  . Tightness of heel cord, left 11/07/2016  . Vitamin D deficiency 06/29/2015  . Weight gain 08/28/2015    Past Surgical History:  Procedure Laterality Date  . ABDOMINAL HYSTERECTOMY  2008  . BRAIN SURGERY  02/2016   removal of 2 blood clotts from the brain.  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 BREAST EXCISIONAL BIOPSY Left 01/2014   lymph node  . BREAST SURGERY Left 02/04/14   left core bx identifying a fibroadenoma and excision of left axillary lymph node, benign  . BURR HOLE FOR SUBDURAL HEMATOMA  March 18, 2016  . CHOLECYSTECTOMY N/A 04/09/2017   Procedure: LAPAROSCOPIC CHOLECYSTECTOMY WITH INTRAOPERATIVE CHOLANGIOGRAM;  Surgeon: Robert Bellow, MD;  Location: [facility] ORS;  Service: General;  Laterality: N/A;  . COLONOSCOPY  12/21/2015  . COLONOSCOPY W/ BIOPSIES  12/08/2016   Tubular adenoma the sigmoid. No dysplasia. Benign colonic biopsies without evidence of colitis. [name], [name] [facility]  . FRACTURE SURGERY Right 2005   hand  . LAPAROSCOPIC REVISION VENTRICULAR-PERITONEAL (V-P) SHUNT  2000  . LEFT HEART CATH AND CORONARY ANGIOGRAPHY N/A 04/26/2018   Procedure: LEFT HEART CATH AND CORONARY ANGIOGRAPHY;  Surgeon: Nelva Bush, MD;  Location: [place] [facility];  Service: Cardiovascular;  Laterality: N/A;  . LEG SURGERY Left 2002  . NECK SURGERY  1985  . POSTERIOR LAMINECTOMY THORACIC AND LUMBAR SPINE Bilateral 1985   Scoliosis  stabilization  . SHUNT REVISION  2003 & July 2017  . SPINE SURGERY  1985   Scoliosis  . TUBAL LIGATION  1995  . UPPER GI ENDOSCOPY  12/08/2016   Hypertrophic gastric polyp, mild chronic gastritis. No evidence of H. pylori. [name], [name], [facility]    Current Medications: Current Meds  Medication Sig  . Albuterol Sulfate (PROAIR RESPICLICK) 188 (90 Base) MCG/ACT AEPB Inhale 2 puffs into the lungs every 6 (six) hours as needed (WHEEZING).  [name] buPROPion (WELLBUTRIN XL) 150 MG 24 hr tablet Take 1 tablet by mouth every morning.  . cetirizine (ZYRTEC) 10 MG tablet Take 10 mg by mouth daily.   . citalopram (CELEXA) 40 MG tablet Take 40 mg by mouth daily.  . cyanocobalamin (,VITAMIN B-12,) 1000 MCG/ML injection Inject 1,000 mcg into the muscle every 30 (thirty) days.  [name] dicyclomine (BENTYL) 20 MG tablet Take 1 tablet (20 mg total) by mouth 3 (three) times daily as needed for spasms.  [name] losartan-hydrochlorothiazide (HYZAAR) 50-12.5 MG  tablet Take 1 tablet by mouth once daily  . pravastatin (PRAVACHOL) 20 MG tablet Take 1 tablet (20 mg total) by mouth every evening.  . rizatriptan (MAXALT) 10 MG tablet Take 1 tablet (10 mg total) by mouth daily as needed for migraine.  . topiramate (TOPAMAX) 100 MG tablet Take 1 tablet (100 mg total) by mouth 2 (two) times daily.  . valACYclovir (VALTREX) 500 MG tablet Take 500 mg by mouth daily.      Allergies:   Doxycycline hyclate, Levofloxacin, Morphine, Penicillins, Sulfa antibiotics, Tramadol, Sumatriptan, Clindamycin, Codeine, Diclofenac potassium(migraine), and Adhesive [tape]   Social History   Socioeconomic History  . Marital status: Married    Spouse name: Sam  . Number of children: 2  . Years of education: GED  . Highest education level: Not on file  Occupational History  . Not on file  Tobacco Use  . Smoking status: Never Smoker  . Smokeless tobacco: Never Used  Vaping Use  . Vaping Use: Never used  Substance and  Sexual Activity  . Alcohol use: No  . Drug use: No  . Sexual activity: Not Currently  Other Topics Concern  . Not on file  Social History Narrative   Right handed    Caffeine use: tea daily   Lives with husband, Sam   Social Determinants of Health   Financial Resource Strain: Not on file  Food Insecurity: Not on file  Transportation Needs: Not on file  Physical Activity: Not on file  Stress: Not on file  Social Connections: Not on file     Family History: The patient's family history includes Brain cancer in her sister; Colon polyps in her mother; Leukemia in her maternal aunt; Leukemia (age of onset: 53) in her mother; Lung cancer in her father. ROS:   Please see the history of present illness.    All other systems reviewed and are negative.  EKGs/Labs/Other Studies Reviewed:    The following studies were reviewed today:  EKG:  EKG ordered today and personally reviewed.  The ekg ordered today demonstrates sinus rhythm T wave abnormality precordial similar to previous EKGs    Physical Exam:    VS:  BP 108/68   Pulse 66   Ht 5\' 3"  (1.6 m)   Wt 205 lb 3.2 oz (93.1 kg)   SpO2 98%   BMI 36.35 kg/m     Wt Readings from Last 3 Encounters:  12/24/20 205 lb 3.2 oz (93.1 kg)  11/24/20 207 lb (93.9 kg)  05/20/20 200 lb (90.7 kg)     GEN:  Well nourished, well developed in no acute distress HEENT: Normal NECK: No JVD; No carotid bruits LYMPHATICS: No lymphadenopathy CARDIAC: RRR, no murmurs, rubs, gallops RESPIRATORY:  Clear to auscultation without rales, wheezing or rhonchi  ABDOMEN: Soft, non-tender, non-distended MUSCULOSKELETAL:  No edema; No deformity  SKIN: Warm and dry NEUROLOGIC:  Alert and oriented x 3 PSYCHIATRIC:  Normal affect    Signed, Shirlee More, MD  12/24/2020 1:20 PM    [facility]

## 2021-01-06 DIAGNOSIS — S56429S Laceration of extensor muscle, fascia and tendon of unspecified finger at forearm level, sequela: Secondary | ICD-10-CM | POA: Insufficient documentation

## 2021-01-06 DIAGNOSIS — S61209S Unspecified open wound of unspecified finger without damage to nail, sequela: Secondary | ICD-10-CM

## 2021-01-06 HISTORY — DX: Unspecified open wound of unspecified finger without damage to nail, sequela: S61.209S

## 2021-01-10 DIAGNOSIS — Z0271 Encounter for disability determination: Secondary | ICD-10-CM

## 2021-01-10 DIAGNOSIS — Z124 Encounter for screening for malignant neoplasm of cervix: Secondary | ICD-10-CM | POA: Insufficient documentation

## 2021-01-10 HISTORY — DX: Encounter for screening for malignant neoplasm of cervix: Z12.4

## 2021-02-17 ENCOUNTER — Other Ambulatory Visit: Payer: Self-pay | Admitting: Cardiology

## 2021-03-22 DIAGNOSIS — S5002XA Contusion of left elbow, initial encounter: Secondary | ICD-10-CM | POA: Insufficient documentation

## 2021-03-22 HISTORY — DX: Contusion of left elbow, initial encounter: S50.02XA

## 2021-04-27 DIAGNOSIS — T24202A Burn of second degree of unspecified site of left lower limb, except ankle and foot, initial encounter: Secondary | ICD-10-CM | POA: Insufficient documentation

## 2021-04-27 HISTORY — DX: Burn of second degree of unspecified site of left lower limb, except ankle and foot, initial encounter: T24.202A

## 2021-06-27 ENCOUNTER — Telehealth: Payer: Self-pay | Admitting: Neurology

## 2021-06-27 NOTE — Telephone Encounter (Signed)
[name] presented to the emergency room at [facility] ER with right abdominal pain.  She had a CT scan.  Per the report, she had hyperdense changes near the tip of the shunt (which is in the left abdomen) possibly concerning for a small amount of blood.  While in the emergency room she received some treatment and her pain is doing better now.   I am not sure what to make of this finding.  It is the opposite side of her abdominal pain and is probably unrelated.  If pain persists or recurs we will consider reimaging and having her see the surgeon who placed the shunt.  Impression: -No acute intra-abdominal pathology.  -Small-volume hyperdense material along the VP shunt within the left lateral hemiabdomen, which may represent small volume hemorrhage. Question recent shunt manipulation.

## 2021-07-08 ENCOUNTER — Emergency Department: Payer: Medicare Other

## 2021-07-08 ENCOUNTER — Emergency Department
Admission: EM | Admit: 2021-07-08 | Discharge: 2021-07-08 | Disposition: A | Discharge status: Home / Self Care | Payer: Medicare Other | Attending: Emergency Medicine | Admitting: Emergency Medicine

## 2021-07-08 ENCOUNTER — Other Ambulatory Visit: Payer: Self-pay

## 2021-07-08 DIAGNOSIS — Z79899 Other long term (current) drug therapy: Secondary | ICD-10-CM | POA: Diagnosis not present

## 2021-07-08 DIAGNOSIS — R519 Headache, unspecified: Secondary | ICD-10-CM | POA: Insufficient documentation

## 2021-07-08 DIAGNOSIS — E876 Hypokalemia: Secondary | ICD-10-CM | POA: Diagnosis not present

## 2021-07-08 DIAGNOSIS — I10 Essential (primary) hypertension: Secondary | ICD-10-CM | POA: Diagnosis not present

## 2021-07-08 DIAGNOSIS — Z4541 Encounter for adjustment and management of cerebrospinal fluid drainage device: Secondary | ICD-10-CM | POA: Diagnosis not present

## 2021-07-08 DIAGNOSIS — J45909 Unspecified asthma, uncomplicated: Secondary | ICD-10-CM | POA: Diagnosis not present

## 2021-07-08 DIAGNOSIS — Z982 Presence of cerebrospinal fluid drainage device: Secondary | ICD-10-CM

## 2021-07-08 DIAGNOSIS — T85618A Breakdown (mechanical) of other specified internal prosthetic devices, implants and grafts, initial encounter: Secondary | ICD-10-CM

## 2021-07-08 LAB — COMPREHENSIVE METABOLIC PANEL
ALT: 25 U/L (ref 0–44)
AST: 20 U/L (ref 15–41)
Albumin: 4.1 g/dL (ref 3.5–5.0)
Alkaline Phosphatase: 69 U/L (ref 38–126)
Anion gap: 7 (ref 5–15)
BUN: 9 mg/dL (ref 6–20)
CO2: 26 mmol/L (ref 22–32)
Calcium: 9.2 mg/dL (ref 8.9–10.3)
Chloride: 106 mmol/L (ref 98–111)
Creatinine, Ser: 0.84 mg/dL (ref 0.44–1.00)
GFR, Estimated: >60 mL/min (ref >60)
Glucose, Bld: 96 mg/dL (ref 70–99)
Potassium: 3.2 mmol/L — ABNORMAL LOW (ref 3.5–5.1)
Sodium: 139 mmol/L (ref 135–145)
Total Bilirubin: 0.9 mg/dL (ref 0.3–1.2)
Total Protein: 7.6 g/dL (ref 6.5–8.1)

## 2021-07-08 LAB — CBC WITH DIFFERENTIAL/PLATELET
Abs Immature Granulocytes: 0.04 10*3/uL (ref 0.00–0.07)
Basophils Absolute: 0 10*3/uL (ref 0.0–0.1)
Basophils Relative: 1 %
Eosinophils Absolute: 0.2 10*3/uL (ref 0.0–0.5)
Eosinophils Relative: 3 %
HCT: 39.1 % (ref 36.0–46.0)
Hemoglobin: 13.9 g/dL (ref 12.0–15.0)
Immature Granulocytes: 1 %
Lymphocytes Relative: 28 %
Lymphs Abs: 2 10*3/uL (ref 0.7–4.0)
MCH: 30.4 pg (ref 26.0–34.0)
MCHC: 35.5 g/dL (ref 30.0–36.0)
MCV: 85.6 fL (ref 80.0–100.0)
Monocytes Absolute: 0.4 10*3/uL (ref 0.1–1.0)
Monocytes Relative: 5 %
Neutro Abs: 4.6 10*3/uL (ref 1.7–7.7)
Neutrophils Relative %: 62 %
Platelets: 302 10*3/uL (ref 150–400)
RBC: 4.57 MIL/uL (ref 3.87–5.11)
RDW: 13.2 % (ref 11.5–15.5)
WBC: 7.2 10*3/uL (ref 4.0–10.5)
nRBC: 0 % (ref 0.0–0.2)

## 2021-07-08 LAB — LIPASE, BLOOD: Lipase: 34 U/L (ref 11–51)

## 2021-07-08 LAB — TROPONIN I (HIGH SENSITIVITY): Troponin I (High Sensitivity): 3 ng/L (ref <18)

## 2021-07-08 MED ORDER — DICYCLOMINE HCL 10 MG PO CAPS: 10.0000 mg | ORAL_CAPSULE | Freq: Three times a day (TID) | ORAL | 0 refills | Status: DC

## 2021-07-08 MED ORDER — ACETAMINOPHEN 500 MG PO TABS
1000.0000 mg | ORAL_TABLET | Freq: Once | ORAL | Status: AC
  Administered 2021-07-08: 1000 mg via ORAL
  Filled 2021-07-08: qty 2

## 2021-07-08 MED ORDER — BUTALBITAL-APAP-CAFFEINE 50-325-40 MG PO TABS: 1.0000 | ORAL_TABLET | Freq: Four times a day (QID) | ORAL | 0 refills | Status: AC | PRN

## 2021-07-08 MED ORDER — ONDANSETRON HCL 4 MG/2ML IJ SOLN
4.0000 mg | Freq: Once | INTRAMUSCULAR | Status: AC
  Administered 2021-07-08: 4 mg via INTRAVENOUS
  Filled 2021-07-08: qty 2

## 2021-07-08 MED ORDER — POTASSIUM CHLORIDE CRYS ER 20 MEQ PO TBCR: 20.0000 meq | EXTENDED_RELEASE_TABLET | Freq: Two times a day (BID) | ORAL | 0 refills | Status: DC

## 2021-07-08 MED ORDER — DICYCLOMINE HCL 10 MG PO CAPS
10.0000 mg | ORAL_CAPSULE | Freq: Once | ORAL | Status: AC
  Administered 2021-07-08: 10 mg via ORAL
  Filled 2021-07-08: qty 1

## 2021-07-08 MED ORDER — ONDANSETRON 4 MG PO TBDP: 4.0000 mg | ORAL_TABLET | Freq: Once | ORAL | Status: DC

## 2021-07-08 MED ORDER — KETOROLAC TROMETHAMINE 30 MG/ML IJ SOLN
15.0000 mg | Freq: Once | INTRAMUSCULAR | Status: AC
  Administered 2021-07-08: 15 mg via INTRAVENOUS
  Filled 2021-07-08: qty 1

## 2021-07-08 NOTE — ED Provider Notes (Signed)
[facility] Emergency Department Provider Note  ____________________________________________   Event Date/Time   First MD Initiated Contact with Patient 07/08/21 1231     (approximate)  I have reviewed the triage vital signs and the nursing notes.   HISTORY  Chief Complaint Headache    HPI [name] is a 50 y.o. female with hydrocephalus VP shunt, hyperlipidemia, hypertension who comes in with concerns for headache.  Patient reports having a headache since the beginning of October that has been gradually getting worse not better with her home medications.  Patient states that she has been taking Topamax as well as her Maxalt.  This not been helping her headache, constant, nothing seems to make it worse.  She reports a little bit of upper abdominal pain but states that she is been out of her Bentyl and had a CT scan done recently that was otherwise reassuring.  She also reports a little bit of chest pain that just started today as well.  She states that she just feels like she gets chest pain when she gets stressed.   On review of records patient was seen on 10/10 at outside ER.  Patient had a CT abdomen/pelvis that was negative for acute pathology except for there is some question about blood around the VP shunt site.  They had discussed this with the neurologist who felt it was likely incidental and not related.  Patient was feeling better after Toradol, Zofran and fluid bolus and was discharged home  CT Abdomen Pelvis W Contrast  Final Result  -No acute intra-abdominal pathology.  -Small-volume hyperdense material along the VP shunt within the left lateral hemiabdomen, which may represent small volume hemorrhage. Question recent shunt manipulation.           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 pylori infection 2015   Herpes genitalia    Hydrocephalus ([place]) 08/29/2015   Hyperlipidemia    Hyperlipidemia, mixed 05/28/2016   Hypertension    Increased frequency of urination 09/03/2017   Internal derangement of right knee 11/25/2015   Overview:  2017   Lupus ([place])    Dr. 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   Subdural hematoma July 2017   Bilateral   Thyroid function test abnormal 12/06/2017   2019: TSH =7.3   Tightness of heel cord, left 11/07/2016   Vitamin D deficiency 06/29/2015   Weight gain 08/28/2015    Patient Active Problem List   Diagnosis Date Noted   Seizures ([place])    Lupus ([place])    Hypertension    Hyperlipidemia    Herpes genitalia    GERD (gastroesophageal reflux disease)    Arthritis     Anxiety    Depression    UTI symptoms 11/26/2020   Chronic migraine w/o aura w/o status migrainosus, not intractable 11/24/2020   Anxiety and depression 11/24/2020   Fall 03/16/2020   Morbid obesity with BMI of 40.0-44.9, adult ([place]) 03/16/2020   Acute kidney injury ([place]) 02/12/2020   Injury of right hand 02/12/2020   Neck pain 11/18/2019   Suspected COVID-19 virus infection 01/24/2019   Urinary tract infection 01/24/2019   Family history of premature coronary artery disease 01/09/2019   Elevated d-dimer 11/18/2018   Vagina bleeding 06/10/2018   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   Palpitation 04/24/2018   B12 deficiency 03/05/2018   Acute pain of left knee 12/06/2017   Thyroid function test abnormal 12/06/2017   Chronic fatigue 11/13/2017   Allergic state 10/29/2017   Increased frequency of urination 09/03/2017   Right lower quadrant abdominal pain 06/29/2017   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   Screening for breast cancer 11/16/2016   Plantar fasciitis 11/07/2016   Tightness of heel cord, left 11/07/2016   Hyperlipidemia, mixed 05/28/2016   Prediabetes 05/28/2016   Epilepsy ([place]) 04/10/2016   Subdural hematoma 03/2016   Chronic pain syndrome 01/11/2016   Osteoarthritis of both knees 01/11/2016   ANA positive 01/04/2016   Asthma 01/04/2016   Connective tissue disease ([place]) 01/04/2016   Chronic joint pain 12/02/2015   Allergic rhinitis 11/25/2015   Internal derangement of right knee 11/25/2015   Chronic migraine 11/25/2015   Migraine without status migrainosus, not intractable 11/25/2015   Generalized anxiety disorder 10/27/2015   Recurrent major depressive disorder, in remission ([place]) 10/27/2015   Benign hypertension 10/25/2015   Personal history of healed traumatic fracture 09/15/2015   Acute sinusitis, unspecified 08/29/2015   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 pylori infection 2015    Past Surgical History:  Procedure Laterality Date   ABDOMINAL HYSTERECTOMY  2008   BRAIN SURGERY  02/2016   removal of 2 blood clotts from the brain.   BREAST BIOPSY Left 01/2014   2:00-fibroadeoma   BREAST EXCISIONAL BIOPSY Left 01/2014   lymph node   BREAST SURGERY Left 02/04/14   left core bx identifying a fibroadenoma and excision of left axillary lymph node, benign   BURR HOLE FOR SUBDURAL HEMATOMA  March 18, 2016   CHOLECYSTECTOMY N/A 04/09/2017   Procedure: LAPAROSCOPIC CHOLECYSTECTOMY WITH INTRAOPERATIVE CHOLANGIOGRAM;  Surgeon: Robert Bellow, MD;  Location: [facility] ORS;  Service: General;  Laterality: N/A;   COLONOSCOPY  12/21/2015   COLONOSCOPY W/ BIOPSIES  12/08/2016   Tubular adenoma the  sigmoid. No dysplasia. Benign colonic biopsies without evidence of colitis. [name], [name] [facility]   FRACTURE SURGERY Right 2005   hand   LAPAROSCOPIC REVISION VENTRICULAR-PERITONEAL (V-P) SHUNT  2000   LEFT HEART CATH AND CORONARY ANGIOGRAPHY N/A 04/26/2018   Procedure: LEFT HEART CATH AND CORONARY ANGIOGRAPHY;  Surgeon: Nelva Bush, MD;  Location: [place] [facility];  Service: Cardiovascular;  Laterality: N/A;   LEG SURGERY Left 2002   NECK SURGERY  1985   POSTERIOR LAMINECTOMY THORACIC AND LUMBAR SPINE Bilateral 1985   Scoliosis stabilization   SHUNT REVISION  2003 & July 2017   SPINE SURGERY  1985   Scoliosis   TUBAL LIGATION  1995   UPPER GI ENDOSCOPY  12/08/2016   Hypertrophic gastric polyp, mild chronic gastritis. No evidence of H. pylori. [name], [name], [facility]    Prior to Admission medications   Medication Sig Start Date End Date Taking?  Authorizing Provider  Albuterol Sulfate (PROAIR RESPICLICK) 937 (90 Base) MCG/ACT AEPB Inhale 2 puffs into the lungs every 6 (six) hours as needed (WHEEZING).    [provider]  buPROPion (WELLBUTRIN XL) 150 MG 24 hr tablet Take 1 tablet by mouth every morning. 11/17/20   [provider]  cetirizine (ZYRTEC) 10 MG tablet Take 10 mg by mouth daily.     [provider]  citalopram (CELEXA) 40 MG tablet Take 40 mg by mouth daily. 08/26/20   [provider]  cyanocobalamin (,VITAMIN B-12,) 1000 MCG/ML injection Inject 1,000 mcg into the muscle every 30 (thirty) days.    [provider]  dicyclomine (BENTYL) 20 MG tablet Take 1 tablet (20 mg total) by mouth 3 (three) times daily as needed for spasms. 09/09/17 11/15/19  Orbie Pyo, MD  losartan-hydrochlorothiazide (HYZAAR) 50-12.5 MG tablet TAKE ONE TABLET BY MOUTH EVERY DAY 02/17/21   Richardo Priest, MD  pravastatin (PRAVACHOL) 20 MG tablet Take 1 tablet (20 mg total) by mouth every evening. 05/23/19 08/21/19  Richardo Priest, MD  rizatriptan (MAXALT) 10 MG tablet Take 1 tablet (10 mg total) by mouth daily as needed for migraine. 11/24/20   Sater, Nanine Means, MD  topiramate (TOPAMAX) 100 MG tablet Take 1 tablet (100 mg total) by mouth 2 (two) times daily. 11/24/20   Sater, Nanine Means, MD  valACYclovir (VALTREX) 500 MG tablet Take 500 mg by mouth daily.  08/30/17   [provider]    Allergies Doxycycline hyclate, Levofloxacin, Morphine, Penicillins, Sulfa antibiotics, Tramadol, Sumatriptan, Clindamycin, Codeine, Diclofenac potassium(migraine), and Adhesive [tape]  Family History  Problem Relation Age of Onset   Leukemia Mother 43   Colon polyps Mother    Lung cancer Father    Leukemia Maternal Aunt    Brain cancer Sister     Social History Social History   Tobacco Use   Smoking status: Never   Smokeless tobacco: Never  Vaping Use   Vaping Use: Never used  Substance Use Topics    Alcohol use: No   Drug use: No      Review of Systems Constitutional: No fever/chills Eyes: No visual changes. ENT: No sore throat. Cardiovascular: Chest pain Respiratory: Denies shortness of breath. Gastrointestinal: Abdominal pain no nausea, no vomiting.  No diarrhea.  No constipation. Genitourinary: Negative for dysuria. Musculoskeletal: Negative for back pain. Skin: Negative for rash. Neurological: Positive headache, no focal weakness or numbness. All other ROS negative ____________________________________________   PHYSICAL EXAM:  VITAL SIGNS: ED Triage Vitals  Enc Vitals  Group     BP 07/08/21 1119 (!) 167/102     Pulse Rate 07/08/21 1119 72     Resp 07/08/21 1119 16     Temp 07/08/21 1119 97.7 F (36.5 C)     Temp Source 07/08/21 1119 Oral     SpO2 07/08/21 1119 97 %     Weight 07/08/21 1228 205 lb 4 oz (93.1 kg)     Height 07/08/21 1228 5\' 3"  (1.6 m)     Head Circumference --      Peak Flow --      Pain Score 07/08/21 1045 10     Pain Loc --      Pain Edu? --      Excl. in [place]? --     Constitutional: Alert and oriented. Well appearing and in no acute distress. Eyes: Conjunctivae are normal. EOMI. pupils reactive bilaterally Head: Atraumatic.  VP shunt noted Nose: No congestion/rhinnorhea. Mouth/Throat: Mucous membranes are moist.   Neck: No stridor. Trachea Midline. FROM Cardiovascular: Normal rate, regular rhythm. Grossly normal heart sounds.  Good peripheral circulation. Respiratory: Normal respiratory effort.  No retractions. Lungs CTAB. Gastrointestinal: Soft and nontender. No distention. No abdominal bruits.  Musculoskeletal: No lower extremity tenderness nor edema.  No joint effusions. Neurologic:  Normal speech and language. No gross focal neurologic deficits are appreciated.  Equal strength in arms and legs. Skin:  Skin is warm, dry and intact. No rash noted. Psychiatric: Mood and affect are normal. Speech and behavior are normal. GU: Deferred    ____________________________________________   LABS (all labs ordered are listed, but only abnormal results are displayed)  Labs Reviewed  COMPREHENSIVE METABOLIC PANEL - Abnormal; Notable for the following components:      Result Value   Potassium 3.2 (*)    All other components within normal limits  CBC WITH DIFFERENTIAL/PLATELET  LIPASE, BLOOD  TROPONIN I (HIGH SENSITIVITY)  TROPONIN I (HIGH SENSITIVITY)   ____________________________________________   ED ECG REPORT I, Vanessa [place], the attending physician, personally viewed and interpreted this ECG.  Normal sinus rate of 69, no ST elevation, some T wave inversions in V2 V3 V4 and V5 and a little bit in lead III, reviewed patient's prior EKG and this looks similar, intervals are normal ____________________________________________  RADIOLOGY   Official radiology report(s): DG Skull 1-3 Views  Result Date: 07/08/2021 CLINICAL DATA:  Shunt series EXAM: DG CERVICAL SPINE - 1 VIEW; CHEST 1 VIEW; ABDOMEN - 1 VIEW; SKULL - 1-3 VIEW COMPARISON:  Chest radiograph 09/09/2017 FINDINGS: Skull: Cerclage wires noted fusing the C1 and C2 spinous processes. Multiple burr holes are noted. The shunt catheter extends to the region of the right lateral ventricle. It appears contiguous. A short segment is not well evaluated which overlies the posterior calvarium. Cervical spine: Revision catheter appears contiguous in the right neck. Mild degenerative changes seen throughout the cervical spine. Chest: Right chest wall shunt catheter appears contiguous, however there is slight redundancy of the catheter at the level of the lower chest. Heart size within normal limits. Lungs clear. Thoracic spinal hardware unchanged in appearance. Abdomen: VP shunt catheter extends to the left lower quadrant. Visualized segments are contiguous. S shaped scoliosis of the thoracolumbar spine. Right upper quadrant surgical clips likely related to prior cholecystectomy.  No dilated loops of bowel to indicate ileus or obstruction. IMPRESSION: 1. VP shunt catheter appears contiguous from the right intracranial space to the left lower quadrant. Evaluation of the segment of the catheter overlying the posterior  calvarium is limited. There is slight redundancy of the catheter in the right lower chest which is new since the prior examination. 2. No acute abnormality of the calvarium, C-spine, chest, or abdomen. Electronically Signed   By: Miachel Roux [name]   On: 07/08/2021 12:42   DG Chest 1 View  Result Date: 07/08/2021 CLINICAL DATA:  Shunt series EXAM: DG CERVICAL SPINE - 1 VIEW; CHEST 1 VIEW; ABDOMEN - 1 VIEW; SKULL - 1-3 VIEW COMPARISON:  Chest radiograph 09/09/2017 FINDINGS: Skull: Cerclage wires noted fusing the C1 and C2 spinous processes. Multiple burr holes are noted. The shunt catheter extends to the region of the right lateral ventricle. It appears contiguous. A short segment is not well evaluated which overlies the posterior calvarium. Cervical spine: Revision catheter appears contiguous in the right neck. Mild degenerative changes seen throughout the cervical spine. Chest: Right chest wall shunt catheter appears contiguous, however there is slight redundancy of the catheter at the level of the lower chest. Heart size within normal limits. Lungs clear. Thoracic spinal hardware unchanged in appearance. Abdomen: VP shunt catheter extends to the left lower quadrant. Visualized segments are contiguous. S shaped scoliosis of the thoracolumbar spine. Right upper quadrant surgical clips likely related to prior cholecystectomy. No dilated loops of bowel to indicate ileus or obstruction. IMPRESSION: 1. VP shunt catheter appears contiguous from the right intracranial space to the left lower quadrant. Evaluation of the segment of the catheter overlying the posterior calvarium is limited. There is slight redundancy of the catheter in the right lower chest which is new since the prior  examination. 2. No acute abnormality of the calvarium, C-spine, chest, or abdomen. Electronically Signed   By: Miachel Roux [name]   On: 07/08/2021 12:42   DG Cervical Spine 1 View  Result Date: 07/08/2021 CLINICAL DATA:  Shunt series EXAM: DG CERVICAL SPINE - 1 VIEW; CHEST 1 VIEW; ABDOMEN - 1 VIEW; SKULL - 1-3 VIEW COMPARISON:  Chest radiograph 09/09/2017 FINDINGS: Skull: Cerclage wires noted fusing the C1 and C2 spinous processes. Multiple burr holes are noted. The shunt catheter extends to the region of the right lateral ventricle. It appears contiguous. A short segment is not well evaluated which overlies the posterior calvarium. Cervical spine: Revision catheter appears contiguous in the right neck. Mild degenerative changes seen throughout the cervical spine. Chest: Right chest wall shunt catheter appears contiguous, however there is slight redundancy of the catheter at the level of the lower chest. Heart size within normal limits. Lungs clear. Thoracic spinal hardware unchanged in appearance. Abdomen: VP shunt catheter extends to the left lower quadrant. Visualized segments are contiguous. S shaped scoliosis of the thoracolumbar spine. Right upper quadrant surgical clips likely related to prior cholecystectomy. No dilated loops of bowel to indicate ileus or obstruction. IMPRESSION: 1. VP shunt catheter appears contiguous from the right intracranial space to the left lower quadrant. Evaluation of the segment of the catheter overlying the posterior calvarium is limited. There is slight redundancy of the catheter in the right lower chest which is new since the prior examination. 2. No acute abnormality of the calvarium, C-spine, chest, or abdomen. Electronically Signed   By: Miachel Roux [name]   On: 07/08/2021 12:42   DG Abdomen 1 View  Result Date: 07/08/2021 CLINICAL DATA:  Shunt series EXAM: DG CERVICAL SPINE - 1 VIEW; CHEST 1 VIEW; ABDOMEN - 1 VIEW; SKULL - 1-3 VIEW COMPARISON:  Chest radiograph  09/09/2017 FINDINGS: Skull: Cerclage wires noted fusing the C1 and C2  spinous processes. Multiple burr holes are noted. The shunt catheter extends to the region of the right lateral ventricle. It appears contiguous. A short segment is not well evaluated which overlies the posterior calvarium. Cervical spine: Revision catheter appears contiguous in the right neck. Mild degenerative changes seen throughout the cervical spine. Chest: Right chest wall shunt catheter appears contiguous, however there is slight redundancy of the catheter at the level of the lower chest. Heart size within normal limits. Lungs clear. Thoracic spinal hardware unchanged in appearance. Abdomen: VP shunt catheter extends to the left lower quadrant. Visualized segments are contiguous. S shaped scoliosis of the thoracolumbar spine. Right upper quadrant surgical clips likely related to prior cholecystectomy. No dilated loops of bowel to indicate ileus or obstruction. IMPRESSION: 1. VP shunt catheter appears contiguous from the right intracranial space to the left lower quadrant. Evaluation of the segment of the catheter overlying the posterior calvarium is limited. There is slight redundancy of the catheter in the right lower chest which is new since the prior examination. 2. No acute abnormality of the calvarium, C-spine, chest, or abdomen. Electronically Signed   By: Miachel Roux [name]   On: 07/08/2021 12:42   CT HEAD WO CONTRAST (5MM)  Result Date: 07/08/2021 CLINICAL DATA:  Headache, VP shunt EXAM: CT HEAD WITHOUT CONTRAST TECHNIQUE: Contiguous axial images were obtained from the base of the skull through the vertex without intravenous contrast. COMPARISON:  02/09/2020 FINDINGS: Brain: Right posterior approach shunt catheter is in similar position. Caliber of the enlarged ventricles is not substantially changed when accounting for differences in head positioning. 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 No acute  intracranial hemorrhage. No new loss of gray-white differentiation. Vascular: No hyperdense vessel. Skull: Calvarium is unremarkable. Sinuses/Orbits: No acute finding. Other: Partially imaged chronic superior migration of the dens. IMPRESSION: Shunt catheter present with similar size of enlarged ventricles. No acute intracranial hemorrhage. Electronically Signed   By: Macy Mis [name]   On: 07/08/2021 12:23    ____________________________________________   PROCEDURES  Procedure(s) performed (including Critical Care):  Procedures   ____________________________________________   INITIAL IMPRESSION / ASSESSMENT AND PLAN / ED COURSE  [name] was evaluated in Emergency Department on 07/08/2021 for the symptoms described in the history of present illness. She was evaluated in the context of the global COVID-19 pandemic, which necessitated consideration that the patient might be at risk for infection with the SARS-CoV-2 virus that causes COVID-19. Institutional protocols and algorithms that pertain to the evaluation of patients at risk for COVID-19 are in a state of rapid change based on information released by regulatory bodies including the CDC and federal and state organizations. These policies and algorithms were followed during the patient's care in the ED.    Patient with multiple concerns but most notably is the headache.  CT head used to work for worsening hydrocephalus as well as shunt series to look for shunt failure.  Doubt meningitis given patient is afebrile and well-appearing do not drive on the medication prescribed as it can these were both reassuring although they did come a little bit of redundancy of the shunt catheter.  Discussed with Dr. Lacinda Axon who will follow up with her outpatient but does not need any emergent procedures.  We will also give her neurology follow-up given she reports being dissatisfied with her neurologist currently.  For patient's chest pain that seems  atypical in nature but will get an EKG and cardiac marker.  Symptoms been going on for  greater than 3 hours since a troponin was drawn and was negative therefore rules out ACS.  For the upper abdominal pain seems very minimal in nature had recent CT scan that was negative.  There was some concern of some blood near the shunt catheter but again I talked to neurosurgery nothing to do for this.  Her abdomen is otherwise nontender and very minimal in nature and suspect this was related to her not taking her Bentyl.  On repeat evaluation after the medications patient is feeling much better.  We will prescribe her some medications for home and give her neurology and neurosurgery follow-up       ____________________________________________   FINAL CLINICAL IMPRESSION(S) / ED DIAGNOSES   Final diagnoses:  Headache  S/P VP shunt  Hypokalemia      MEDICATIONS GIVEN DURING THIS VISIT:  Medications  acetaminophen (TYLENOL) tablet 1,000 mg (1,000 mg Oral Given 07/08/21 1425)  dicyclomine (BENTYL) capsule 10 mg (10 mg Oral Given 07/08/21 1425)  ketorolac (TORADOL) 30 MG/ML injection 15 mg (15 mg Intravenous Given 07/08/21 1425)  ondansetron (ZOFRAN) injection 4 mg (4 mg Intravenous Given 07/08/21 1426)     ED Discharge Orders          Ordered    butalbital-acetaminophen-caffeine (FIORICET) 50-325-40 MG tablet  Every 6 hours PRN        07/08/21 1549    dicyclomine (BENTYL) 10 MG capsule  3 times daily before meals & bedtime        07/08/21 1629    potassium chloride SA (KLOR-CON) 20 MEQ tablet  2 times daily        07/08/21 1629             Note:  This document was prepared using Dragon voice recognition software and may include unintentional dictation errors.    Vanessa [place], MD 07/08/21 [phone]

## 2021-07-08 NOTE — Discharge Instructions (Addendum)
Please call the above numbers to get a ER follow-up for your ultrasound and your migraines.  We have started you on some medication to help with migraines.  Turn to ER for develop fevers, worsening pain or any other concerns.   Do nor drive on medication prescribed.

## 2021-07-08 NOTE — ED Triage Notes (Signed)
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

## 2021-07-12 DIAGNOSIS — M5481 Occipital neuralgia: Secondary | ICD-10-CM

## 2021-07-12 DIAGNOSIS — G500 Trigeminal neuralgia: Secondary | ICD-10-CM | POA: Insufficient documentation

## 2021-07-12 HISTORY — DX: Occipital neuralgia: M54.81

## 2021-07-12 HISTORY — DX: Trigeminal neuralgia: G50.0

## 2021-08-26 DIAGNOSIS — K922 Gastrointestinal hemorrhage, unspecified: Secondary | ICD-10-CM

## 2021-08-26 HISTORY — DX: Gastrointestinal hemorrhage, unspecified: K92.2

## 2021-11-17 ENCOUNTER — Encounter: Payer: Self-pay | Admitting: Oncology

## 2021-11-24 ENCOUNTER — Ambulatory Visit: Payer: Medicare Other | Admitting: Family Medicine

## 2021-12-24 ENCOUNTER — Other Ambulatory Visit: Payer: Self-pay | Admitting: Neurology

## 2022-01-28 ENCOUNTER — Other Ambulatory Visit: Payer: Self-pay | Admitting: Cardiology

## 2022-02-03 ENCOUNTER — Ambulatory Visit: Admission: RE | Admit: 2022-02-03 | Payer: Medicare Other | Source: Home / Self Care | Admitting: Neurology

## 2022-02-03 ENCOUNTER — Other Ambulatory Visit: Payer: Self-pay | Admitting: Neurology

## 2022-02-03 DIAGNOSIS — Z982 Presence of cerebrospinal fluid drainage device: Secondary | ICD-10-CM

## 2022-02-10 ENCOUNTER — Ambulatory Visit
Admission: RE | Admit: 2022-02-10 | Discharge: 2022-02-10 | Disposition: A | Discharge status: Home / Self Care | Payer: Medicare Other | Source: Ambulatory Visit | Attending: Neurology | Admitting: Neurology

## 2022-02-10 DIAGNOSIS — Z982 Presence of cerebrospinal fluid drainage device: Secondary | ICD-10-CM | POA: Insufficient documentation

## 2022-03-10 DIAGNOSIS — L03116 Cellulitis of left lower limb: Secondary | ICD-10-CM | POA: Insufficient documentation

## 2022-03-10 HISTORY — DX: Cellulitis of left lower limb: L03.116

## 2022-03-15 ENCOUNTER — Encounter: Payer: Self-pay | Admitting: Emergency Medicine

## 2022-03-15 ENCOUNTER — Other Ambulatory Visit: Payer: Self-pay

## 2022-03-15 ENCOUNTER — Emergency Department
Admission: EM | Admit: 2022-03-15 | Discharge: 2022-03-15 | Disposition: A | Discharge status: Home / Self Care | Payer: Medicare Other | Attending: Emergency Medicine | Admitting: Emergency Medicine

## 2022-03-15 ENCOUNTER — Encounter: Payer: Self-pay | Admitting: Oncology

## 2022-03-15 ENCOUNTER — Emergency Department: Payer: Medicare Other

## 2022-03-15 DIAGNOSIS — I10 Essential (primary) hypertension: Secondary | ICD-10-CM | POA: Insufficient documentation

## 2022-03-15 DIAGNOSIS — Y92410 Unspecified street and highway as the place of occurrence of the external cause: Secondary | ICD-10-CM | POA: Diagnosis not present

## 2022-03-15 DIAGNOSIS — M545 Low back pain, unspecified: Secondary | ICD-10-CM | POA: Insufficient documentation

## 2022-03-15 DIAGNOSIS — M25512 Pain in left shoulder: Secondary | ICD-10-CM | POA: Insufficient documentation

## 2022-03-15 DIAGNOSIS — M542 Cervicalgia: Secondary | ICD-10-CM | POA: Insufficient documentation

## 2022-03-15 DIAGNOSIS — S3991XA Unspecified injury of abdomen, initial encounter: Secondary | ICD-10-CM | POA: Diagnosis present

## 2022-03-15 LAB — BASIC METABOLIC PANEL
Anion gap: 7 (ref 5–15)
BUN: 14 mg/dL (ref 6–20)
CO2: 26 mmol/L (ref 22–32)
Calcium: 9.5 mg/dL (ref 8.9–10.3)
Chloride: 109 mmol/L (ref 98–111)
Creatinine, Ser: 1.17 mg/dL — ABNORMAL HIGH (ref 0.44–1.00)
GFR, Estimated: 56 mL/min — ABNORMAL LOW (ref >60)
Glucose, Bld: 111 mg/dL — ABNORMAL HIGH (ref 70–99)
Potassium: 3.5 mmol/L (ref 3.5–5.1)
Sodium: 142 mmol/L (ref 135–145)

## 2022-03-15 LAB — CBC WITH DIFFERENTIAL/PLATELET
Abs Immature Granulocytes: 0.01 10*3/uL (ref 0.00–0.07)
Basophils Absolute: 0 10*3/uL (ref 0.0–0.1)
Basophils Relative: 1 %
Eosinophils Absolute: 0.2 10*3/uL (ref 0.0–0.5)
Eosinophils Relative: 3 %
HCT: 34.6 % — ABNORMAL LOW (ref 36.0–46.0)
Hemoglobin: 11.6 g/dL — ABNORMAL LOW (ref 12.0–15.0)
Immature Granulocytes: 0 %
Lymphocytes Relative: 27 %
Lymphs Abs: 1.7 10*3/uL (ref 0.7–4.0)
MCH: 30.1 pg (ref 26.0–34.0)
MCHC: 33.5 g/dL (ref 30.0–36.0)
MCV: 89.9 fL (ref 80.0–100.0)
Monocytes Absolute: 0.2 10*3/uL (ref 0.1–1.0)
Monocytes Relative: 4 %
Neutro Abs: 4 10*3/uL (ref 1.7–7.7)
Neutrophils Relative %: 65 %
Platelets: 230 10*3/uL (ref 150–400)
RBC: 3.85 MIL/uL — ABNORMAL LOW (ref 3.87–5.11)
RDW: 13.1 % (ref 11.5–15.5)
WBC: 6.1 10*3/uL (ref 4.0–10.5)
nRBC: 0 % (ref 0.0–0.2)

## 2022-03-15 MED ORDER — IOHEXOL 300 MG/ML  SOLN
100.0000 mL | Freq: Once | INTRAMUSCULAR | Status: AC | PRN
  Administered 2022-03-15: 100 mL via INTRAVENOUS

## 2022-03-15 MED ORDER — LIDOCAINE 5 % EX PTCH: 1.0000 | MEDICATED_PATCH | Freq: Two times a day (BID) | CUTANEOUS | 0 refills | Status: DC

## 2022-03-15 MED ORDER — LIDOCAINE 5 % EX PTCH: 1.0000 | MEDICATED_PATCH | Freq: Two times a day (BID) | CUTANEOUS | 0 refills | Status: AC

## 2022-03-15 NOTE — Discharge Instructions (Signed)
Please return for any new, worsening, or change in symptoms or other concerns.  It was a pleasure caring for you today.
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
## 2022-03-23 DIAGNOSIS — S161XXA Strain of muscle, fascia and tendon at neck level, initial encounter: Secondary | ICD-10-CM

## 2022-03-23 DIAGNOSIS — S29012A Strain of muscle and tendon of back wall of thorax, initial encounter: Secondary | ICD-10-CM

## 2022-03-23 DIAGNOSIS — R202 Paresthesia of skin: Secondary | ICD-10-CM

## 2022-03-23 DIAGNOSIS — S39012A Strain of muscle, fascia and tendon of lower back, initial encounter: Secondary | ICD-10-CM | POA: Insufficient documentation

## 2022-03-23 HISTORY — DX: Strain of muscle and tendon of back wall of thorax, initial encounter: S29.012A

## 2022-03-23 HISTORY — DX: Strain of muscle, fascia and tendon at neck level, initial encounter: S16.1XXA

## 2022-03-23 HISTORY — DX: Strain of muscle, fascia and tendon of lower back, initial encounter: S39.012A

## 2022-03-23 HISTORY — DX: Paresthesia of skin: R20.2

## 2022-04-02 ENCOUNTER — Emergency Department: Payer: Medicare Other

## 2022-04-02 ENCOUNTER — Other Ambulatory Visit: Payer: Self-pay

## 2022-04-02 ENCOUNTER — Emergency Department
Admission: EM | Admit: 2022-04-02 | Discharge: 2022-04-02 | Disposition: A | Discharge status: Home / Self Care | Payer: Medicare Other | Attending: Emergency Medicine | Admitting: Emergency Medicine

## 2022-04-02 DIAGNOSIS — M25532 Pain in left wrist: Secondary | ICD-10-CM | POA: Insufficient documentation

## 2022-04-02 DIAGNOSIS — M542 Cervicalgia: Secondary | ICD-10-CM | POA: Insufficient documentation

## 2022-04-02 DIAGNOSIS — W182XXA Fall in (into) shower or empty bathtub, initial encounter: Secondary | ICD-10-CM | POA: Insufficient documentation

## 2022-04-02 DIAGNOSIS — M545 Low back pain, unspecified: Secondary | ICD-10-CM | POA: Diagnosis not present

## 2022-04-02 DIAGNOSIS — T148XXA Other injury of unspecified body region, initial encounter: Secondary | ICD-10-CM | POA: Insufficient documentation

## 2022-04-02 DIAGNOSIS — T07XXXA Unspecified multiple injuries, initial encounter: Secondary | ICD-10-CM

## 2022-04-02 DIAGNOSIS — M79645 Pain in left finger(s): Secondary | ICD-10-CM | POA: Diagnosis not present

## 2022-04-02 DIAGNOSIS — S0990XA Unspecified injury of head, initial encounter: Secondary | ICD-10-CM

## 2022-04-02 DIAGNOSIS — Y92002 Bathroom of unspecified non-institutional (private) residence single-family (private) house as the place of occurrence of the external cause: Secondary | ICD-10-CM | POA: Insufficient documentation

## 2022-04-02 DIAGNOSIS — W19XXXA Unspecified fall, initial encounter: Secondary | ICD-10-CM

## 2022-04-02 MED ORDER — TRAMADOL HCL 50 MG PO TABS: 50.0000 mg | ORAL_TABLET | Freq: Four times a day (QID) | ORAL | 0 refills | Status: DC | PRN

## 2022-04-02 MED ORDER — MELOXICAM 15 MG PO TABS: 15.0000 mg | ORAL_TABLET | Freq: Every day | ORAL | 2 refills | Status: DC

## 2022-04-02 MED ORDER — IOHEXOL 350 MG/ML SOLN: 75.0000 mL | Freq: Once | INTRAVENOUS | Status: DC | PRN

## 2022-04-02 MED ORDER — BACLOFEN 10 MG PO TABS: 10.0000 mg | ORAL_TABLET | Freq: Three times a day (TID) | ORAL | 0 refills | Status: AC

## 2022-04-02 NOTE — ED Provider Notes (Signed)
[facility] Provider Note    Event Date/Time   First MD Initiated Contact with Patient 04/02/22 1119     (approximate)   History   Fall   HPI  [name] is a 51 y.o. female with no significant past medical history presents emergency department after falling in the shower on Friday.  States she hurts on the left side of her body from head to toe.  Did hit her head does complain of neck pain, back pain, left wrist and left thumb pain, no LOC      Physical Exam   Triage Vital Signs: ED Triage Vitals [04/02/22 0922]  Enc Vitals Group     BP (!) 133/97     Pulse Rate 77     Resp 18     Temp 98 F (36.7 C)     Temp src      SpO2 96 %     Weight      Height      Head Circumference      Peak Flow      Pain Score 10     Pain Loc      Pain Edu?      Excl. in [place]?     Most recent vital signs: Vitals:   04/02/22 0922  BP: (!) 133/97  Pulse: 77  Resp: 18  Temp: 98 F (36.7 C)  SpO2: 96%     General: Awake, no distress.   CV:  Good peripheral perfusion. regular rate and  rhythm Resp:  Normal effort.  Abd:  No distention.   Other:  C-spine mildly tender, left knee tender, left hand is tender, lumbar spine is tender, left ankle is tender, neurovascular is intact   ED Results / Procedures / Treatments   Labs (all labs ordered are listed, but only abnormal results are displayed) Labs Reviewed - No data to display   EKG     RADIOLOGY CT of the head, C-spine, x-ray of the lumbar spine, x-ray of the left ankle left knee and left hand    PROCEDURES:   Procedures   MEDICATIONS ORDERED IN ED: Medications  iohexol (OMNIPAQUE) 350 MG/ML injection 75 mL (has no administration in time range)     IMPRESSION / MDM / ASSESSMENT AND PLAN / ED COURSE  I reviewed the triage vital signs and the nursing notes.                              Differential diagnosis includes, but is not limited to, contusion sprain fracture, skull  fracture, subdural, SAH  Patient's presentation is most consistent with acute presentation with potential threat to life or bodily function.   Patient's imaging is reassuring, her x-ray of her hand, left knee, lumbar spine, left ankle were all independently reviewed by me and interpreted as being negative.  Confirmed by radiology  CT of the head and C-spine independently reviewed by me and confirmed by radiology but to be negative  I did explain findings to the patient.  Due to the amount of pain she is having she was given a prescription for meloxicam, baclofen and tramadol.  She is to follow-up with her regular doctor if not improving in 1 week.  Return emergency department worsening.  She was discharged stable condition.      FINAL CLINICAL IMPRESSION(S) / ED DIAGNOSES   Final diagnoses:  Fall, initial encounter  Multiple contusions  Minor  head injury, initial encounter     Rx / DC Orders   ED Discharge Orders          Ordered    meloxicam (MOBIC) 15 MG tablet  Daily        04/02/22 1124    baclofen (LIORESAL) 10 MG tablet  3 times daily        04/02/22 1124    traMADol (ULTRAM) 50 MG tablet  Every 6 hours PRN        04/02/22 1124             Note:  This document was prepared using Dragon voice recognition software and may include unintentional dictation errors.    Versie Starks, PA-C 04/02/22 1207    Naaman Plummer, MD 04/03/22 [phone]

## 2022-04-02 NOTE — ED Triage Notes (Signed)
Pt comes with c/o slip and fall in the shower. Pt states she hit left sided of face, head and side. Pt states this happened on Friday but pain has gotten worse. Pt states back pain as well.  No loc, no thinners.

## 2022-04-02 NOTE — ED Notes (Signed)
Pt in imaging

## 2022-04-02 NOTE — ED Provider Triage Note (Signed)
Emergency Medicine Provider Triage Evaluation Note  [name] , a 51 y.o. female  was evaluated in triage.  Pt complains of head injury, back injury, left hand and left ankle along with left knee pain after a fall in the shower 2 days ago.  No LOC.  States pain has worsened and she is a 12 out of 10.  Review of Systems  Positive: Fall Negative: LOC  Physical Exam  BP (!) 133/97   Pulse 77   Temp 98 F (36.7 C)   Resp 18   SpO2 96%  Gen:   Awake, no distress   Resp:  Normal effort  MSK:   Moves extremities without difficulty  Other:    Medical Decision Making  Medically screening exam initiated at 9:27 AM.  Appropriate orders placed.  Elmo Shumard was informed that the remainder of the evaluation will be completed by another provider, this initial triage assessment does not replace that evaluation, and the importance of remaining in the ED until their evaluation is complete.  Areas that were actually tender to palpation will be imaged.   Faythe Ghee, PA-C 04/02/22 [phone]

## 2022-04-02 NOTE — Discharge Instructions (Signed)
Follow-up with your regular doctor if not improved in 3 days.  Return emergency department worsening.  Take medications as prescribed. °

## 2022-04-02 NOTE — ED Triage Notes (Signed)
First RN note= pt slipped in shower.  Pain to left side of body.  Ambulatory without difficulty.

## 2022-04-17 ENCOUNTER — Other Ambulatory Visit: Payer: Self-pay
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
## 2022-04-18 ENCOUNTER — Ambulatory Visit (INDEPENDENT_AMBULATORY_CARE_PROVIDER_SITE_OTHER): Payer: Medicare Other | Admitting: Cardiology

## 2022-04-18 ENCOUNTER — Encounter: Payer: Self-pay | Admitting: Cardiology

## 2022-04-18 VITALS — BP 114/80 | HR 70 | Ht 63.0 in | Wt 201.8 lb

## 2022-04-18 DIAGNOSIS — I209 Angina pectoris, unspecified: Secondary | ICD-10-CM

## 2022-04-18 DIAGNOSIS — I10 Essential (primary) hypertension: Secondary | ICD-10-CM | POA: Diagnosis not present

## 2022-04-18 DIAGNOSIS — E782 Mixed hyperlipidemia: Secondary | ICD-10-CM

## 2022-04-18 MED ORDER — NITROGLYCERIN 0.4 MG SL SUBL: 0.4000 mg | SUBLINGUAL_TABLET | SUBLINGUAL | 1 refills | Status: DC | PRN

## 2022-04-18 NOTE — Patient Instructions (Signed)
Medication Instructions:  Your physician has recommended you make the following change in your medication:   START: Nitroglycerin 0.4 mg under the tongue every 5 minutes as needed for chest pain  *If you need a refill on your cardiac medications before your next appointment, please call your pharmacy*   Lab Work: None If you have labs (blood work) drawn today and your tests are completely normal, you will receive your results only by: [place] (if you have MyChart) OR A paper copy in the mail If you have any lab test that is abnormal or we need to change your treatment, we will call you to review the results.   Testing/Procedures: None   Follow-Up: At [facility], you and your health needs are our priority.  As part of our continuing mission to provide you with exceptional heart care, we have created designated Provider Care Teams.  These Care Teams include your primary Cardiologist (physician) and Advanced Practice Providers (APPs -  Physician Assistants and Nurse Practitioners) who all work together to provide you with the care you need, when you need it.  We recommend signing up for the patient portal called "MyChart".  Sign up information is provided on this After Visit Summary.  MyChart is used to connect with patients for Virtual Visits (Telemedicine).  Patients are able to view lab/test results, encounter notes, upcoming appointments, etc.  Non-urgent messages can be sent to your provider as well.   To learn more about what you can do with MyChart, go to [email or web address].    Your next appointment:   1 year(s)  The format for your next appointment:   In Person  Provider:   Shirlee More, MD    Other Instructions None  Important Information About Sugar

## 2022-07-17 ENCOUNTER — Encounter: Payer: Self-pay | Admitting: Internal Medicine

## 2022-07-17 ENCOUNTER — Telehealth: Payer: Self-pay | Admitting: Internal Medicine

## 2022-07-17 ENCOUNTER — Ambulatory Visit (INDEPENDENT_AMBULATORY_CARE_PROVIDER_SITE_OTHER): Payer: PPO | Admitting: Internal Medicine

## 2022-07-17 VITALS — BP 132/85 | HR 65 | Temp 98.5°F | Resp 16 | Ht 63.0 in | Wt 209.4 lb

## 2022-07-17 DIAGNOSIS — R079 Chest pain, unspecified: Secondary | ICD-10-CM

## 2022-07-17 DIAGNOSIS — G4710 Hypersomnia, unspecified: Secondary | ICD-10-CM

## 2022-07-17 DIAGNOSIS — R042 Hemoptysis: Secondary | ICD-10-CM

## 2022-07-17 DIAGNOSIS — R0602 Shortness of breath: Secondary | ICD-10-CM

## 2022-07-17 DIAGNOSIS — K219 Gastro-esophageal reflux disease without esophagitis: Secondary | ICD-10-CM

## 2022-07-17 NOTE — Progress Notes (Signed)
[facility] [place], [place] [address]  Pulmonary Sleep Medicine   Office Visit Note  Patient Name: [name] DOB: [birth year] MRN [id number]  Date of Service: 07/17/2022  Complaints/HPI: She has a history of Pulmonary nodules. Patient states she had been seeing a pulmonologist for this. She now notes she has hemoptysis off and on for 2-3 weeks. Patient states associated lower chest pain. She states she feels as though her esophagus is shutting down. She states she does have a history of scid reflux. She states she does have history of migraines and takes tpomax. She states that she does feel short of breath. She states her stomach is bloated in the morning. She staets she does have episodes where she regurgitates at night while asleep. She has had some weight loss and states she is not trying to lose weight. She states she was a smoker back in 1996 she quit. 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 She is a native of [place]. 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 She had it revised in [facility] in 2017. She has noted hypersomnia. She states that she has snoring feels tired when she gets up. She has noted excessive fatigue all day long.  ROS  General: (-) fever, (-) chills, (-) night sweats, (-) weakness Skin: (-) rashes, (-) itching,. Eyes: (-) visual changes, (-) redness, (-) itching. Nose and Sinuses: (-) nasal stuffiness or itchiness, (-) postnasal drip, (-) nosebleeds, (-) sinus trouble. Mouth and Throat: (-) sore throat, (-) hoarseness. Neck: (-) swollen glands, (-) enlarged thyroid, (-) neck pain. Respiratory: + cough, (-) bloody sputum, + shortness of breath, + wheezing. Cardiovascular: - ankle swelling, (-) chest pain. Lymphatic: (-) lymph node enlargement. Neurologic: (-) numbness, (-) tingling. Psychiatric: (-) anxiety, (-) depression   Current Medication: Outpatient Encounter Medications as of  07/17/2022  Medication Sig   buPROPion (WELLBUTRIN XL) 300 MG 24 hr tablet Take 300 mg by mouth daily.   citalopram (CELEXA) 40 MG tablet Take 40 mg by mouth daily.   ezetimibe-simvastatin (VYTORIN) 10-10 MG tablet Take 1 tablet by mouth at bedtime.   losartan-hydrochlorothiazide (HYZAAR) 50-12.5 MG tablet Take 1 tablet by mouth daily.   montelukast (SINGULAIR) 10 MG tablet Take 10 mg by mouth at bedtime.   pravastatin (PRAVACHOL) 40 MG tablet Take 40 mg by mouth daily.   topiramate (TOPAMAX) 100 MG tablet Take 100 mg by mouth daily.   valACYclovir (VALTREX) 500 MG tablet Take 500 mg by mouth daily.   [DISCONTINUED] meloxicam (MOBIC) 15 MG tablet Take 1 tablet (15 mg total) by mouth daily.   [DISCONTINUED] traMADol (ULTRAM) 50 MG tablet Take 1 tablet (50 mg total) by mouth every 6 (six) hours as needed.   No facility-administered encounter medications on file as of 07/17/2022.    Surgical History: History reviewed. No pertinent surgical history.  Medical History: Past Medical History:  Diagnosis Date   Allergy    Asthma     Family History: Family History  Problem Relation Age of Onset   Asthma Mother    Asthma Father    Asthma Sister    COPD Brother    Asthma Brother    Asthma Maternal Grandmother    Asthma Maternal Grandfather    Asthma Paternal Grandmother    Asthma Paternal Grandfather     Social History: Social History   Socioeconomic History   Marital status: Legally Separated    Spouse  name: Not on file   Number of children: Not on file   Years of education: Not on file   Highest education level: Not on file  Occupational History   Not on file  Tobacco Use   Smoking status: Never   Smokeless tobacco: Never  Substance and Sexual Activity   Alcohol use: Not on file   Drug use: Never   Sexual activity: Not on file  Other Topics Concern   Not on file  Social History Narrative   Not on file   Social Determinants of Health   Financial Resource Strain:  Not on file  Food Insecurity: Not on file  Transportation Needs: Not on file  Physical Activity: Not on file  Stress: Not on file  Social Connections: Not on file  Intimate Partner Violence: Not on file    Vital Signs: Blood pressure 132/85, pulse 65, temperature 98.5 F (36.9 C), resp. rate 16, height '5\' 3"'$  (1.6 m), weight 209 lb 6.4 oz (95 kg), SpO2 96 %.  Examination: General Appearance: The patient is well-developed, well-nourished, and in no distress. Skin: Gross inspection of skin unremarkable. Head: normocephalic, no gross deformities. Eyes: no gross deformities noted. ENT: ears appear grossly normal no exudates. Neck: Supple. No thyromegaly. No LAD. Respiratory: no rhochi noted. Cardiovascular: Normal S1 and S2 without murmur or rub. Extremities: No cyanosis. pulses are equal. Neurologic: Alert and oriented. No involuntary movements.  LABS: No results found for this or any previous visit (from the past 2160 hour(s)).  Radiology: DG Hand Complete Left  Result Date: 04/02/2022 CLINICAL DATA:  Acute LEFT hand pain following fall. Initial encounter. EXAM: LEFT HAND - COMPLETE 3 VIEW COMPARISON:  07/25/2011 radiographs FINDINGS: No acute fracture or dislocation identified. Remote distal radial and ulnar styloid fractures again noted. No focal bony lesions are noted. IMPRESSION: No evidence of acute abnormality. Electronically Signed   By: Margarette Canada [name]   On: 04/02/2022 10:43   DG Knee Complete 4 Views Left  Result Date: 04/02/2022 CLINICAL DATA:  Acute LEFT knee pain following fall. Initial encounter. EXAM: LEFT KNEE - COMPLETE 4 VIEW COMPARISON:  06/28/2020 radiographs FINDINGS: There is no evidence of acute fracture, subluxation or dislocation. No joint effusion is noted. An unchanged bony density along the tibia in the anterior joint is unchanged but may represent a small loose body. IMPRESSION: 1. No evidence of acute abnormality. 2. Unchanged possible small loose body  within the anterior joint. Electronically Signed   By: Margarette Canada [name]   On: 04/02/2022 10:41   DG Lumbar Spine 2-3 Views  Result Date: 04/02/2022 CLINICAL DATA:  Acute low back pain following fall. Initial encounter. EXAM: LUMBAR SPINE - 3 VIEW COMPARISON:  03/15/2022 CT and prior studies FINDINGS: There is no evidence of acute subluxation or fracture. Thoracolumbar dextroscoliosis again identified as well as posterior rod fixation extending from the thoracic region to the L2 level. No focal bony lesions are present. IMPRESSION: 1. No evidence of acute abnormality. 2. Thoracolumbar dextroscoliosis and posterior rod fixation. Electronically Signed   By: Margarette Canada [name]   On: 04/02/2022 10:38   DG Ankle Complete Left  Result Date: 04/02/2022 CLINICAL DATA:  Fall.  Left ankle pain. EXAM: LEFT ANKLE COMPLETE - 3+ VIEW COMPARISON:  Left ankle radiographs 06/09/2017 at [facility] [place]. FINDINGS: The ankle is located. No effusion is present. Remote healed fracture noted in the distal tibial metaphysis. No acute fractures or soft tissue injury. IMPRESSION: 1. No acute fracture or dislocation.  2. Remote healed fracture of the distal tibial metaphysis. Electronically Signed   By: San Morelle [name]   On: 04/02/2022 10:36   CT HEAD WO CONTRAST (5MM)  Result Date: 04/02/2022 CLINICAL DATA:  Head trauma, moderate-severe; Neck trauma, dangerous injury mechanism (Age 35-64y) EXAM: CT HEAD WITHOUT CONTRAST CT CERVICAL SPINE WITHOUT CONTRAST TECHNIQUE: Multidetector CT imaging of the head and cervical spine was performed following the standard protocol without intravenous contrast. Multiplanar CT image reconstructions of the cervical spine were also generated. RADIATION DOSE REDUCTION: This exam was performed according to the departmental dose-optimization program which includes automated exposure control, adjustment of the mA and/or kV according to patient size and/or use of iterative reconstruction technique.  COMPARISON:  03/15/2022, 02/10/2022 FINDINGS: CT HEAD FINDINGS Brain: Stable right parietal approach ventriculostomy catheter with tip closely approximating the foramina Monroe. Caliber of enlarged ventricular system is unchanged from prior. Redemonstrated Dandy-Walker malformation. No evidence of acute infarction. No intracranial hemorrhage, mass, or midline shift. Vascular: No hyperdense vessel or unexpected calcification. Skull: Multiple prior burr holes.  No acute calvarial fracture. Sinuses/Orbits: No acute finding. Other: Negative for scalp hematoma. CT CERVICAL SPINE FINDINGS Alignment: Facet joints are aligned without dislocation or traumatic listhesis. Dens and lateral masses are aligned. Straightening of the cervical lordosis. Skull base and vertebrae: No acute fracture. No primary bone lesion or focal pathologic process. Posterior fusion at C1-C2 with solid ankylosis. Soft tissues and spinal canal: No prevertebral fluid or swelling. No visible canal hematoma. Retropharyngeal course of the bilateral carotid arteries. Disc levels:  Similar mild cervical spondylosis. Upper chest: Negative. Other: None. IMPRESSION: 1. No acute intracranial abnormality. 2. No acute fracture or subluxation of the cervical spine. 3. Stable positioning of the right parietal approach ventriculostomy catheter with stable size of the ventricular system. 4. Redemonstrated Dandy-Walker malformation. Electronically Signed   By: Davina Poke D.O.   On: 04/02/2022 10:16   CT Cervical Spine Wo Contrast  Result Date: 04/02/2022 CLINICAL DATA:  Head trauma, moderate-severe; Neck trauma, dangerous injury mechanism (Age 37-64y) EXAM: CT HEAD WITHOUT CONTRAST CT CERVICAL SPINE WITHOUT CONTRAST TECHNIQUE: Multidetector CT imaging of the head and cervical spine was performed following the standard protocol without intravenous contrast. Multiplanar CT image reconstructions of the cervical spine were also generated. RADIATION DOSE  REDUCTION: This exam was performed according to the departmental dose-optimization program which includes automated exposure control, adjustment of the mA and/or kV according to patient size and/or use of iterative reconstruction technique. COMPARISON:  03/15/2022, 02/10/2022 FINDINGS: CT HEAD FINDINGS Brain: Stable right parietal approach ventriculostomy catheter with tip closely approximating the foramina Monroe. Caliber of enlarged ventricular system is unchanged from prior. Redemonstrated Dandy-Walker malformation. No evidence of acute infarction. No intracranial hemorrhage, mass, or midline shift. Vascular: No hyperdense vessel or unexpected calcification. Skull: Multiple prior burr holes.  No acute calvarial fracture. Sinuses/Orbits: No acute finding. Other: Negative for scalp hematoma. CT CERVICAL SPINE FINDINGS Alignment: Facet joints are aligned without dislocation or traumatic listhesis. Dens and lateral masses are aligned. Straightening of the cervical lordosis. Skull base and vertebrae: No acute fracture. No primary bone lesion or focal pathologic process. Posterior fusion at C1-C2 with solid ankylosis. Soft tissues and spinal canal: No prevertebral fluid or swelling. No visible canal hematoma. Retropharyngeal course of the bilateral carotid arteries. Disc levels:  Similar mild cervical spondylosis. Upper chest: Negative. Other: None. IMPRESSION: 1. No acute intracranial abnormality. 2. No acute fracture or subluxation of the cervical spine. 3. Stable positioning of the right parietal approach  ventriculostomy catheter with stable size of the ventricular system. 4. Redemonstrated Dandy-Walker malformation. Electronically Signed   By: Davina Poke D.O.   On: 04/02/2022 10:16    No results found.  No results found.    Assessment and Plan: There are no problems to display for this patient.  1. Hemoptysis This is of concern we will get imaging studies to further assess.  In addition to that I  will get CBC and a Mitzie - CT Chest W Contrast; Future - Comprehensive Metabolic Panel (CMET) - CBC with Differential/Platelet  2. Gastroesophageal reflux disease without esophagitis Possibility of GI bleeding cannot be entirely ruled out.  She needs to use PPI as prescribed.  3. Obesity, morbid ([place]) Obesity Counseling: Had a lengthy discussion regarding patients BMI and weight issues. Patient was instructed on portion control as well as increased activity. Also discussed caloric restrictions with trying to maintain intake less than 2000 Kcal. Discussions were made in accordance with the 5As of weight management. Simple actions such as not eating late and if able to, taking a walk is suggested.   4. Chest pain, unspecified type May very well be related to the issue above.  Could also be due to reflux as a cause of her pain  5. Shortness of breath Multifactorial we will get pulmonary functions to establish - Pulmonary function test; Future   6.Hypersomnia I recommended to her getting a sleep study done she does have excessive daytime fatigue which is interfering with her daily activities.  We will get this scheduled  General Counseling: I have discussed the findings of the evaluation and examination with Horris Latino.  I have also discussed any further diagnostic evaluation thatmay be needed or ordered today. Arrow verbalizes understanding of the findings of todays visit. We also reviewed her medications today and discussed drug interactions and side effects including but not limited excessive drowsiness and altered mental states. We also discussed that there is always a risk not just to her but also people around her. she has been encouraged to call the office with any questions or concerns that should arise related to todays visit.  Orders Placed This Encounter  Procedures   CT Chest W Contrast    Standing Status:   Future    Number of Occurrences:   1    Standing Expiration Date:    07/18/2023    Order Specific Question:   If indicated for the ordered procedure, I authorize the administration of contrast media per Radiology protocol    Answer:   Yes    Order Specific Question:   Is patient pregnant?    Answer:   No    Order Specific Question:   Preferred imaging location?    Answer:   [facility]   Comprehensive Metabolic Panel (CMET)   CBC with Differential/Platelet   Pulmonary function test    Standing Status:   Future    Standing Expiration Date:   07/18/2023    Order Specific Question:   Where should this test be performed?    Answer:   [facility]   PSG Sleep Study    Standing Status:   Future    Standing Expiration Date:   07/18/2023    Order Specific Question:   Where should this test be performed:    Answer:   [facility]     Time spent: 15  I have personally obtained a history, examined the patient, evaluated laboratory and imaging results, formulated the assessment and  plan and placed orders.    Allyne Gee, MD [facility] Pulmonary and Critical Care Sleep medicine

## 2022-07-17 NOTE — Telephone Encounter (Signed)
Awaiting 07/17/22 office notes for SS order-Toni

## 2022-07-17 NOTE — Patient Instructions (Signed)

## 2022-07-17 NOTE — Telephone Encounter (Signed)
Notified patient of CT appointment date, time, location and prep-[name]

## 2022-07-24 ENCOUNTER — Ambulatory Visit
Admission: RE | Admit: 2022-07-24 | Discharge: 2022-07-24 | Disposition: A | Discharge status: Home / Self Care | Payer: Medicare Other | Source: Ambulatory Visit | Attending: Internal Medicine | Admitting: Internal Medicine

## 2022-07-24 DIAGNOSIS — R042 Hemoptysis: Secondary | ICD-10-CM | POA: Insufficient documentation

## 2022-07-24 MED ORDER — IOHEXOL 300 MG/ML  SOLN
75.0000 mL | Freq: Once | INTRAMUSCULAR | Status: AC | PRN
  Administered 2022-07-24: 75 mL via INTRAVENOUS

## 2022-07-25 ENCOUNTER — Other Ambulatory Visit: Payer: Self-pay | Admitting: Cardiology

## 2022-07-25 ENCOUNTER — Encounter: Payer: Self-pay | Admitting: Cardiology

## 2022-07-25 ENCOUNTER — Encounter: Payer: Self-pay | Admitting: Oncology

## 2022-07-25 LAB — COMPREHENSIVE METABOLIC PANEL
ALT: 34 IU/L — ABNORMAL HIGH (ref 0–32)
AST: 31 IU/L (ref 0–40)
Albumin/Globulin Ratio: 1.8 (ref 1.2–2.2)
Albumin: 4.5 g/dL (ref 3.8–4.9)
Alkaline Phosphatase: 94 IU/L (ref 44–121)
BUN/Creatinine Ratio: 7 — ABNORMAL LOW (ref 9–23)
BUN: 8 mg/dL (ref 6–24)
Bilirubin Total: 0.4 mg/dL (ref 0.0–1.2)
CO2: 23 mmol/L (ref 20–29)
Calcium: 9.9 mg/dL (ref 8.7–10.2)
Chloride: 105 mmol/L (ref 96–106)
Creatinine, Ser: 1.09 mg/dL — ABNORMAL HIGH (ref 0.57–1.00)
Globulin, Total: 2.5 g/dL (ref 1.5–4.5)
Glucose: 102 mg/dL — ABNORMAL HIGH (ref 70–99)
Potassium: 3.9 mmol/L (ref 3.5–5.2)
Sodium: 144 mmol/L (ref 134–144)
Total Protein: 7 g/dL (ref 6.0–8.5)
eGFR: 61 mL/min/{1.73_m2} (ref >59)

## 2022-07-25 LAB — CBC WITH DIFFERENTIAL/PLATELET
Basophils Absolute: 0 10*3/uL (ref 0.0–0.2)
Basos: 0 %
EOS (ABSOLUTE): 0.4 10*3/uL (ref 0.0–0.4)
Eos: 5 %
Hematocrit: 40.8 % (ref 34.0–46.6)
Hemoglobin: 13.6 g/dL (ref 11.1–15.9)
Immature Grans (Abs): 0 10*3/uL (ref 0.0–0.1)
Immature Granulocytes: 0 %
Lymphocytes Absolute: 2.3 10*3/uL (ref 0.7–3.1)
Lymphs: 31 %
MCH: 30.4 pg (ref 26.6–33.0)
MCHC: 33.3 g/dL (ref 31.5–35.7)
MCV: 91 fL (ref 79–97)
Monocytes Absolute: 0.3 10*3/uL (ref 0.1–0.9)
Monocytes: 4 %
Neutrophils Absolute: 4.3 10*3/uL (ref 1.4–7.0)
Neutrophils: 60 %
Platelets: 286 10*3/uL (ref 150–450)
RBC: 4.47 x10E6/uL (ref 3.77–5.28)
RDW: 13.5 % (ref 11.7–15.4)
WBC: 7.3 10*3/uL (ref 3.4–10.8)

## 2022-07-25 NOTE — Telephone Encounter (Signed)
Rx refill sent to pharmacy. 

## 2022-08-02 ENCOUNTER — Telehealth: Payer: Self-pay | Admitting: Internal Medicine

## 2022-08-02 ENCOUNTER — Ambulatory Visit (INDEPENDENT_AMBULATORY_CARE_PROVIDER_SITE_OTHER): Payer: PPO | Admitting: Internal Medicine

## 2022-08-02 DIAGNOSIS — R0602 Shortness of breath: Secondary | ICD-10-CM | POA: Diagnosis not present

## 2022-08-02 NOTE — Telephone Encounter (Signed)
SS order placed in [facility] folder-Toni 

## 2022-08-07 ENCOUNTER — Other Ambulatory Visit: Payer: Self-pay

## 2022-08-07 ENCOUNTER — Emergency Department
Admission: EM | Admit: 2022-08-07 | Discharge: 2022-08-07 | Disposition: A | Discharge status: Home / Self Care | Payer: Medicare Other | Attending: Emergency Medicine | Admitting: Emergency Medicine

## 2022-08-07 ENCOUNTER — Encounter: Payer: Self-pay | Admitting: Emergency Medicine

## 2022-08-07 DIAGNOSIS — I10 Essential (primary) hypertension: Secondary | ICD-10-CM | POA: Insufficient documentation

## 2022-08-07 DIAGNOSIS — J45909 Unspecified asthma, uncomplicated: Secondary | ICD-10-CM | POA: Diagnosis not present

## 2022-08-07 DIAGNOSIS — R112 Nausea with vomiting, unspecified: Secondary | ICD-10-CM | POA: Diagnosis present

## 2022-08-07 DIAGNOSIS — U071 COVID-19: Secondary | ICD-10-CM | POA: Diagnosis not present

## 2022-08-07 LAB — RESP PANEL BY RT-PCR (FLU A&B, COVID) ARPGX2
Influenza A by PCR: NEGATIVE
Influenza B by PCR: NEGATIVE
SARS Coronavirus 2 by RT PCR: POSITIVE — AB

## 2022-08-07 MED ORDER — ACETAMINOPHEN 325 MG PO TABS
650.0000 mg | ORAL_TABLET | Freq: Once | ORAL | Status: AC
  Administered 2022-08-07: 650 mg via ORAL
  Filled 2022-08-07: qty 2

## 2022-08-07 NOTE — Discharge Instructions (Signed)
Your COVID test is positive.  Please continue to take Tylenol per package instructions for your fever and body aches.  You may take 650 mg every 6-8 hours.  Please return for any new, worsening, or change in symptoms or other concerns.  It was a pleasure caring for you today.

## 2022-08-07 NOTE — ED Triage Notes (Signed)
Pt sts that she has been having N/V/D with body aches. Pt sts that she was with her grand daughter Friday that she tested positive for COVID.

## 2022-08-07 NOTE — ED Provider Triage Note (Signed)
Emergency Medicine Provider Triage Evaluation Note  [name] , a 51 y.o. female  was evaluated in triage.  Pt complains of vomiting and diarrhea.  Grandchild was diagnosed with COVID on Friday.  Had similar symptoms.  States diarrhea is watery, vomited multiple times.  Review of Systems  Positive:  Negative:   Physical Exam  BP 133/86 (BP Location: Left Arm)   Pulse 90   Temp 99.1 F (37.3 C) (Oral)   Resp 18   Wt 90.7 kg   SpO2 96%   BMI 35.43 kg/m  Gen:   Awake, no distress   Resp:  Normal effort  MSK:   Moves extremities without difficulty  Other:    Medical Decision Making  Medically screening exam initiated at 1:23 PM.  Appropriate orders placed.  [name] was informed that the remainder of the evaluation will be completed by another provider, this initial triage assessment does not replace that evaluation, and the importance of remaining in the ED until their evaluation is complete.  Abdominal pain protocols, COVID/influenza test ordered   Danielle Starks, PA-C 08/07/22 1324

## 2022-08-07 NOTE — ED Triage Notes (Signed)
First nurse note:   Pt here via [facility] Pt here for a headache. One bout of vomiting  149/81 CBG 132 98.8 oral  98% HR: 86  20 G R AC  '4mg'$  of Zofran given by EMS

## 2022-08-07 NOTE — ED Provider Notes (Signed)
[facility] Provider Note    Event Date/Time   First MD Initiated Contact with Patient 08/07/22 1358     (approximate)   History   Nausea   HPI  [name] is a 51 y.o. female with a past medical history of lupus, hypertension, hyperlipidemia, GERD, anxiety, depression, epilepsy who presents today for evaluation of nausea, vomiting, diarrhea and body aches that began yesterday.  Patient reports that her granddaughter is sick with COVID and she was with her granddaughter on Friday.  Patient reports that she has received 2 COVID vaccinations in the past.  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   Partial thickness burn of left lower extremity 04/27/2021   Left elbow contusion 03/22/2021   Screening for cervical cancer 01/10/2021   Extensor tendon laceration, finger, open wound, sequela 01/06/2021   Seizures ([place])    Lupus ([place])    Hypertension    Hyperlipidemia    Herpes genitalia    GERD (gastroesophageal reflux disease)    Arthritis    Anxiety    Depression    UTI symptoms 11/26/2020   Chronic migraine w/o aura w/o status migrainosus, not intractable 11/24/2020   Anxiety and depression 11/24/2020   Fall 03/16/2020   Morbid obesity with BMI of 40.0-44.9, adult ([place]) 03/16/2020   Acute kidney injury ([place]) 02/12/2020   Injury of right hand 02/12/2020   Neck pain 11/18/2019   Suspected COVID-19 virus infection 01/24/2019   Urinary tract infection 01/24/2019   Family history of premature coronary artery disease 01/09/2019   Elevated d-dimer 11/18/2018   Vagina bleeding 06/10/2018   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   Palpitation 04/24/2018   B12 deficiency 03/05/2018   Acute pain of left knee 12/06/2017   Thyroid function test abnormal 12/06/2017   Chronic fatigue 11/13/2017   Allergic state 10/29/2017   Increased frequency of urination 09/03/2017   Right lower quadrant abdominal pain 06/29/2017   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   Screening for breast cancer 11/16/2016   Plantar fasciitis 11/07/2016   Tightness of heel cord, left 11/07/2016   Hyperlipidemia, mixed 05/28/2016   Prediabetes 05/28/2016   Epilepsy ([place]) 04/10/2016   Subdural hematoma ([place]) 03/2016   Chronic pain syndrome 01/11/2016   Osteoarthritis of both knees 01/11/2016   ANA positive 01/04/2016   Asthma 01/04/2016   Connective tissue disease ([place]) 01/04/2016   Chronic joint pain 12/02/2015   Allergic rhinitis 11/25/2015   Internal derangement of right knee 11/25/2015   Chronic migraine 11/25/2015   Migraine without status migrainosus, not intractable 11/25/2015   Generalized anxiety disorder 10/27/2015   Recurrent major depressive disorder, in remission ([place]) 10/27/2015   Benign hypertension 10/25/2015   Personal history of healed traumatic fracture 09/15/2015   Acute sinusitis, unspecified 08/29/2015   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 pylori infection 2015          Physical Exam   Triage Vital Signs: ED Triage Vitals  Enc Vitals Group     BP 08/07/22 1321 133/86     Pulse Rate 08/07/22 1321 90     Resp 08/07/22 1321 18     Temp 08/07/22 1321 99.1 F (37.3 C)     Temp Source 08/07/22 1321 Oral     SpO2 08/07/22 1321 96 %     Weight  08/07/22 1322 200 lb (90.7 kg)     Height 08/07/22 1357 '5\' 3"'$  (1.6 m)     Head Circumference --      Peak Flow --      Pain Score 08/07/22 1322 8     Pain Loc --      Pain Edu? --      Excl. in [place]? --     Most recent vital signs: Vitals:   08/07/22 1321  BP: 133/86  Pulse: 90  Resp: 18  Temp: 99.1 F (37.3 C)  SpO2: 96%    Physical Exam Vitals and nursing note reviewed.  Constitutional:      General: Awake and alert. No acute distress.    Appearance: Normal appearance. The patient is normal weight.  HENT:     Head: Normocephalic and atraumatic.     Mouth: Mucous membranes are moist.  Eyes:     General: PERRL. Normal EOMs        Right eye: No discharge.        Left eye: No discharge.     Conjunctiva/sclera: Conjunctivae normal.  Cardiovascular:     Rate and Rhythm: Normal rate and regular rhythm.     Pulses: Normal pulses.  Pulmonary:     Effort: Pulmonary effort is normal. No respiratory distress.     Breath sounds: Normal breath sounds.  Abdominal:     Abdomen is soft. There is no abdominal tenderness. No rebound or guarding. No distention. Musculoskeletal:        General: No swelling. Normal range of motion.     Cervical back: Normal range of motion and neck supple.  Skin:    General: Skin is warm and dry.     Capillary Refill: Capillary refill takes less than 2 seconds.     Findings: No rash.  Neurological:     Mental Status: The patient is awake and alert.      ED Results / Procedures / Treatments   Labs (all labs ordered are listed, but only abnormal results are displayed) Labs Reviewed  RESP PANEL BY RT-PCR (FLU A&B, COVID) ARPGX2 - Abnormal; Notable for the following components:      Result Value   SARS Coronavirus 2 by RT PCR POSITIVE (*)    All other components within normal limits     EKG     RADIOLOGY     PROCEDURES:  Critical Care performed:   Procedures   MEDICATIONS ORDERED IN ED: Medications  acetaminophen (TYLENOL)  tablet 650 mg (has no administration in time range)     IMPRESSION / MDM / ASSESSMENT AND PLAN / ED COURSE  I reviewed the triage vital signs and the nursing notes.   Differential diagnosis includes, but is not limited to, COVID, influenza, URI, pneumonia, gastroenteritis.  Patient is awake and alert, hemodynamically stable and afebrile with a normal oxygen saturation of 96% on room air.  She is able to speak easily in complete sentences.  Her lungs are clear  to auscultation bilaterally, do not suspect pneumonia.  Her COVID test is positive.  I discussed these results with the patient.  No headache or nuchal rigidity or altered mental status to suggest meningitis.  We discussed symptomatic management at home, isolation instructions, and return precautions.  Patient understands and agrees with plan.  She was discharged in stable condition.   Patient's presentation is most consistent with acute complicated illness / injury requiring diagnostic workup.       FINAL CLINICAL IMPRESSION(S) / ED DIAGNOSES   Final diagnoses:  [id number]     Rx / DC Orders   ED Discharge Orders     None        Note:  This document was prepared using Dragon voice recognition software and may include unintentional dictation errors.   Marquette Old, PA-C 08/07/22 1534    Hinda Kehr, MD 08/07/22 [phone]

## 2022-08-14 ENCOUNTER — Telehealth: Payer: Self-pay | Admitting: Internal Medicine

## 2022-08-14 ENCOUNTER — Ambulatory Visit: Payer: Medicare Other | Admitting: Internal Medicine

## 2022-08-14 NOTE — Telephone Encounter (Signed)
SS scheduled for 08/22/22-Toni

## 2022-08-26 NOTE — Procedures (Signed)
[facility] [place], [address]    Complete Pulmonary Function Testing Interpretation:  FINDINGS:  The forced vital capacity is normal.  FEV1 is normal.  FEV1 FVC ratio is normal.  Postbronchodilator no significant change in FEV1.  Total lung capacity is mildly decreased.  Residual volume is decreased.  Residual in total capacity ratio is decreased.  FRC is decreased.  DLCO was normal.  IMPRESSION:  This pulmonary function study is consistent with mild restrictive lung disease clinical correlation is recommended  Allyne Gee, MD [facility] Pulmonary Critical Care Medicine Sleep Medicine

## 2022-08-31 ENCOUNTER — Encounter: Payer: Self-pay | Admitting: Internal Medicine

## 2022-08-31 ENCOUNTER — Ambulatory Visit: Payer: Medicare Other | Admitting: Internal Medicine

## 2022-08-31 ENCOUNTER — Telehealth: Payer: Self-pay

## 2022-08-31 VITALS — BP 178/112 | HR 68 | Temp 98.1°F | Resp 16 | Ht 63.0 in | Wt 202.6 lb

## 2022-08-31 DIAGNOSIS — J4520 Mild intermittent asthma, uncomplicated: Secondary | ICD-10-CM

## 2022-08-31 DIAGNOSIS — U099 Post covid-19 condition, unspecified: Secondary | ICD-10-CM | POA: Diagnosis not present

## 2022-08-31 DIAGNOSIS — K219 Gastro-esophageal reflux disease without esophagitis: Secondary | ICD-10-CM

## 2022-08-31 MED ORDER — IPRATROPIUM BROMIDE 0.02 % IN SOLN: 0.5000 mg | Freq: Four times a day (QID) | RESPIRATORY_TRACT | 12 refills | Status: DC

## 2022-08-31 MED ORDER — ALBUTEROL SULFATE (2.5 MG/3ML) 0.083% IN NEBU: 2.5000 mg | INHALATION_SOLUTION | Freq: Four times a day (QID) | RESPIRATORY_TRACT | 1 refills | Status: DC | PRN

## 2022-08-31 NOTE — Telephone Encounter (Signed)
Send american home patient that we gave pt nebulizer at visit and order is already in [facility]
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
## 2022-09-05 LAB — PULMONARY FUNCTION TEST

## 2022-10-24 ENCOUNTER — Telehealth: Payer: Self-pay | Admitting: Internal Medicine

## 2022-10-24 NOTE — Telephone Encounter (Signed)
Received nebulizer Order from [place]. Gave to dfk for signature-Toni

## 2022-10-30 ENCOUNTER — Telehealth: Payer: Self-pay | Admitting: Internal Medicine

## 2022-10-30 ENCOUNTER — Encounter: Payer: Self-pay | Admitting: Internal Medicine

## 2022-10-30 NOTE — Telephone Encounter (Signed)
09/13/22 Nebulizer supply order faxed back to [place]; [phone].Scanned-Toni

## 2022-11-01 DIAGNOSIS — N1831 Chronic kidney disease, stage 3a: Secondary | ICD-10-CM | POA: Insufficient documentation

## 2022-11-01 DIAGNOSIS — R8290 Unspecified abnormal findings in urine: Secondary | ICD-10-CM | POA: Insufficient documentation

## 2022-11-01 HISTORY — DX: Chronic kidney disease, stage 3a: N18.31

## 2022-11-01 HISTORY — DX: Unspecified abnormal findings in urine: R82.90

## 2022-11-02 ENCOUNTER — Encounter: Payer: Self-pay | Admitting: Oncology

## 2022-11-02 ENCOUNTER — Other Ambulatory Visit: Payer: Self-pay | Admitting: Nephrology

## 2022-11-02 DIAGNOSIS — R8290 Unspecified abnormal findings in urine: Secondary | ICD-10-CM

## 2022-11-02 DIAGNOSIS — E785 Hyperlipidemia, unspecified: Secondary | ICD-10-CM

## 2022-11-02 DIAGNOSIS — N1831 Chronic kidney disease, stage 3a: Secondary | ICD-10-CM

## 2022-11-10 ENCOUNTER — Ambulatory Visit
Admission: RE | Admit: 2022-11-10 | Discharge: 2022-11-10 | Disposition: A | Discharge status: Home / Self Care | Payer: 59 | Source: Ambulatory Visit | Attending: Nephrology | Admitting: Nephrology

## 2022-11-10 DIAGNOSIS — N1831 Chronic kidney disease, stage 3a: Secondary | ICD-10-CM | POA: Diagnosis present

## 2022-11-10 DIAGNOSIS — E785 Hyperlipidemia, unspecified: Secondary | ICD-10-CM | POA: Insufficient documentation

## 2022-11-10 DIAGNOSIS — R8290 Unspecified abnormal findings in urine: Secondary | ICD-10-CM | POA: Diagnosis not present

## 2022-12-08 DIAGNOSIS — S20212A Contusion of left front wall of thorax, initial encounter: Secondary | ICD-10-CM | POA: Insufficient documentation

## 2022-12-08 HISTORY — DX: Contusion of left front wall of thorax, initial encounter: S20.212A

## 2022-12-19 ENCOUNTER — Encounter: Payer: Self-pay | Admitting: Oncology

## 2023-01-02 ENCOUNTER — Ambulatory Visit: Payer: 59 | Admitting: Gastroenterology
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
## 2023-01-29 ENCOUNTER — Encounter: Payer: Self-pay | Admitting: Oncology

## 2023-02-05 ENCOUNTER — Encounter: Payer: Self-pay | Admitting: Oncology

## 2023-02-08 ENCOUNTER — Encounter: Payer: Self-pay | Admitting: Gastroenterology

## 2023-02-08 ENCOUNTER — Ambulatory Visit (INDEPENDENT_AMBULATORY_CARE_PROVIDER_SITE_OTHER): Payer: 59 | Admitting: Gastroenterology

## 2023-02-08 VITALS — BP 123/87 | HR 98 | Temp 98.4°F | Ht 63.0 in | Wt 210.0 lb

## 2023-02-08 DIAGNOSIS — Z86010 Personal history of colonic polyps: Secondary | ICD-10-CM | POA: Diagnosis not present

## 2023-02-08 MED ORDER — DICYCLOMINE HCL 10 MG PO CAPS: 10.0000 mg | ORAL_CAPSULE | Freq: Three times a day (TID) | ORAL | 3 refills | Status: DC

## 2023-02-08 MED ORDER — NA SULFATE-K SULFATE-MG SULF 17.5-3.13-1.6 GM/177ML PO SOLN: 1.0000 | Freq: Once | ORAL | 0 refills | Status: AC
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
## 2023-02-28 ENCOUNTER — Telehealth: Payer: Self-pay

## 2023-02-28 NOTE — Telephone Encounter (Signed)
I spoke to pt, nothing further needed

## 2023-02-28 NOTE — Telephone Encounter (Signed)
Patient left voice message requesting a call back in regards to steroid injection she received in her knee.  She wanted to know if it would have any effects on her having her colonoscopy. She also said she lost her instructions for her colonoscopy.  Message routed to Dr. Annabell Sabal CMA to contact patient.  Thanks, [place], [place]

## 2023-03-05 ENCOUNTER — Encounter: Payer: Self-pay | Admitting: Oncology

## 2023-03-07 DIAGNOSIS — M545 Low back pain, unspecified: Secondary | ICD-10-CM | POA: Insufficient documentation

## 2023-03-07 DIAGNOSIS — M5416 Radiculopathy, lumbar region: Secondary | ICD-10-CM

## 2023-03-07 HISTORY — DX: Radiculopathy, lumbar region: M54.16

## 2023-03-07 HISTORY — DX: Low back pain, unspecified: M54.50

## 2023-03-12 ENCOUNTER — Encounter: Payer: Self-pay | Admitting: Nurse Practitioner

## 2023-03-12 ENCOUNTER — Ambulatory Visit (INDEPENDENT_AMBULATORY_CARE_PROVIDER_SITE_OTHER): Payer: 59 | Admitting: Nurse Practitioner

## 2023-03-12 ENCOUNTER — Telehealth: Payer: Self-pay

## 2023-03-12 VITALS — BP 130/84 | HR 68 | Temp 98.3°F | Resp 16 | Ht 63.0 in | Wt 211.2 lb

## 2023-03-12 DIAGNOSIS — J4540 Moderate persistent asthma, uncomplicated: Secondary | ICD-10-CM

## 2023-03-12 DIAGNOSIS — R0602 Shortness of breath: Secondary | ICD-10-CM | POA: Diagnosis not present

## 2023-03-12 DIAGNOSIS — U099 Post covid-19 condition, unspecified: Secondary | ICD-10-CM

## 2023-03-12 MED ORDER — IPRATROPIUM-ALBUTEROL 0.5-2.5 (3) MG/3ML IN SOLN: 3.0000 mL | RESPIRATORY_TRACT | 3 refills | Status: DC | PRN

## 2023-03-12 MED ORDER — ALBUTEROL SULFATE 108 (90 BASE) MCG/ACT IN AEPB: 2.0000 | INHALATION_SPRAY | Freq: Four times a day (QID) | RESPIRATORY_TRACT | 5 refills | Status: DC | PRN

## 2023-03-12 MED ORDER — BREZTRI AEROSPHERE 160-9-4.8 MCG/ACT IN AERO: 2.0000 | INHALATION_SPRAY | Freq: Two times a day (BID) | RESPIRATORY_TRACT | 11 refills | Status: DC
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
## 2023-03-13 MED ORDER — ALBUTEROL SULFATE HFA 108 (90 BASE) MCG/ACT IN AERS: 2.0000 | INHALATION_SPRAY | Freq: Four times a day (QID) | RESPIRATORY_TRACT | 2 refills | Status: DC | PRN

## 2023-03-13 NOTE — Telephone Encounter (Signed)
Patient notified

## 2023-03-20 ENCOUNTER — Telehealth: Payer: Self-pay | Admitting: Gastroenterology

## 2023-03-20 NOTE — Telephone Encounter (Signed)
Pt is aware that due to her Hx of precancerous polyps, Cologuard is not appropriate as it is not for screening but for Hx of colon polyps

## 2023-03-20 NOTE — Telephone Encounter (Signed)
Pt would like a call back to discuss colorgard instead of colonoscopy scheduled for 04/05/2023

## 2023-03-28 ENCOUNTER — Other Ambulatory Visit: Payer: Self-pay | Admitting: Neurology

## 2023-03-28 DIAGNOSIS — Q031 Atresia of foramina of Magendie and Luschka: Secondary | ICD-10-CM

## 2023-03-29 ENCOUNTER — Ambulatory Visit
Admission: RE | Admit: 2023-03-29 | Discharge: 2023-03-29 | Disposition: A | Discharge status: Home / Self Care | Payer: 59 | Source: Ambulatory Visit | Attending: Neurology | Admitting: Neurology

## 2023-03-29 ENCOUNTER — Encounter: Payer: Self-pay | Admitting: Gastroenterology

## 2023-03-29 DIAGNOSIS — Q031 Atresia of foramina of Magendie and Luschka: Secondary | ICD-10-CM

## 2023-04-04 ENCOUNTER — Encounter: Payer: Self-pay | Admitting: Gastroenterology

## 2023-04-05 ENCOUNTER — Ambulatory Visit: Payer: 59 | Admitting: Anesthesiology

## 2023-04-05 ENCOUNTER — Ambulatory Visit
Admission: RE | Admit: 2023-04-05 | Discharge: 2023-04-05 | Disposition: A | Discharge status: Home / Self Care | Payer: 59 | Attending: Gastroenterology | Admitting: Gastroenterology

## 2023-04-05 ENCOUNTER — Encounter: Payer: Self-pay | Admitting: Gastroenterology

## 2023-04-05 ENCOUNTER — Encounter
Admission: RE | Disposition: A | Discharge status: Home / Self Care | Payer: Self-pay | Source: Home / Self Care | Attending: Gastroenterology

## 2023-04-05 DIAGNOSIS — Z6836 Body mass index (BMI) 36.0-36.9, adult: Secondary | ICD-10-CM | POA: Insufficient documentation

## 2023-04-05 DIAGNOSIS — F411 Generalized anxiety disorder: Secondary | ICD-10-CM | POA: Insufficient documentation

## 2023-04-05 DIAGNOSIS — I10 Essential (primary) hypertension: Secondary | ICD-10-CM | POA: Insufficient documentation

## 2023-04-05 DIAGNOSIS — Z860100 Personal history of colon polyps, unspecified: Secondary | ICD-10-CM

## 2023-04-05 DIAGNOSIS — G894 Chronic pain syndrome: Secondary | ICD-10-CM | POA: Insufficient documentation

## 2023-04-05 DIAGNOSIS — E782 Mixed hyperlipidemia: Secondary | ICD-10-CM | POA: Insufficient documentation

## 2023-04-05 DIAGNOSIS — Z1152 Encounter for screening for COVID-19: Secondary | ICD-10-CM | POA: Insufficient documentation

## 2023-04-05 DIAGNOSIS — K219 Gastro-esophageal reflux disease without esophagitis: Secondary | ICD-10-CM | POA: Insufficient documentation

## 2023-04-05 DIAGNOSIS — K635 Polyp of colon: Secondary | ICD-10-CM | POA: Diagnosis not present

## 2023-04-05 DIAGNOSIS — G40909 Epilepsy, unspecified, not intractable, without status epilepticus: Secondary | ICD-10-CM | POA: Diagnosis not present

## 2023-04-05 DIAGNOSIS — Z1211 Encounter for screening for malignant neoplasm of colon: Secondary | ICD-10-CM | POA: Diagnosis not present

## 2023-04-05 DIAGNOSIS — K640 First degree hemorrhoids: Secondary | ICD-10-CM | POA: Insufficient documentation

## 2023-04-05 DIAGNOSIS — Z79899 Other long term (current) drug therapy: Secondary | ICD-10-CM | POA: Diagnosis not present

## 2023-04-05 DIAGNOSIS — Z83719 Family history of colon polyps, unspecified: Secondary | ICD-10-CM | POA: Insufficient documentation

## 2023-04-05 HISTORY — PX: COLONOSCOPY WITH PROPOFOL: SHX5780

## 2023-04-05 HISTORY — DX: Polyp of colon: K63.5

## 2023-04-05 HISTORY — DX: Personal history of colon polyps, unspecified: Z86.0100

## 2023-04-05 HISTORY — PX: POLYPECTOMY: SHX5525

## 2023-04-05 SURGERY — COLONOSCOPY WITH PROPOFOL
Anesthesia: General

## 2023-04-05 MED ORDER — PROPOFOL 500 MG/50ML IV EMUL
INTRAVENOUS | Status: DC | PRN
  Administered 2023-04-05: 140 ug/kg/min via INTRAVENOUS

## 2023-04-05 MED ORDER — SODIUM CHLORIDE 0.9 % IV SOLN: INTRAVENOUS | Status: DC

## 2023-04-05 MED ORDER — PROPOFOL 10 MG/ML IV BOLUS
INTRAVENOUS | Status: DC | PRN
Start: 2023-04-05 — End: 2023-04-05
  Administered 2023-04-05: 70 mg via INTRAVENOUS

## 2023-04-05 NOTE — Anesthesia Postprocedure Evaluation (Signed)
Anesthesia Post Note  Patient: [name]  Procedure(s) Performed: COLONOSCOPY WITH PROPOFOL POLYPECTOMY  Patient location during evaluation: Endoscopy Anesthesia Type: General Level of consciousness: awake and alert Pain management: pain level controlled Vital Signs Assessment: post-procedure vital signs reviewed and stable Respiratory status: spontaneous breathing, nonlabored ventilation, respiratory function stable and patient connected to nasal cannula oxygen Cardiovascular status: blood pressure returned to baseline and stable Postop Assessment: no apparent nausea or vomiting Anesthetic complications: no   No notable events documented.   Last Vitals:  Vitals:   04/05/23 0900 04/05/23 0907  BP: 119/63 120/72  Pulse: 69 71  Resp: 16 19  Temp:    SpO2: 99% 100%    Last Pain:  Vitals:   04/05/23 0857  TempSrc: Temporal  PainSc: 0-No pain                 Louie Boston

## 2023-04-05 NOTE — Transfer of Care (Signed)
Immediate Anesthesia Transfer of Care Note  Patient: [name]  Procedure(s) Performed: COLONOSCOPY WITH PROPOFOL POLYPECTOMY  Patient Location: PACU  Anesthesia Type:General  Level of Consciousness: awake, alert , and oriented  Airway & Oxygen Therapy: Patient Spontanous Breathing  Post-op Assessment: Report given to RN and Post -op Vital signs reviewed and stable  Post vital signs: Reviewed and stable  Last Vitals:  Vitals Value Taken Time  BP 119/63 04/05/23 0858  Temp    Pulse 74 04/05/23 0858  Resp 12 04/05/23 0858  SpO2 99 % 04/05/23 0858  Vitals shown include unfiled device data.  Last Pain:  Vitals:   04/05/23 0720  TempSrc: Temporal  PainSc: 0-No pain         Complications: No notable events documented.
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
## 2023-04-05 NOTE — Op Note (Signed)
[facility] Gastroenterology Patient Name: [name] Procedure Date: 04/05/2023 8:35 AM MRN: [id number] Account #: [id number] Date of Birth: Oct 12, 1970 Admit Type: Outpatient Age: 52 Room: [facility] ENDO ROOM 4 Gender: Female Note Status: Finalized Instrument Name: Prentice Docker [id number] Procedure:             Colonoscopy Indications:           High risk colon cancer surveillance: Personal history                         of colonic polyps Providers:             Midge Minium MD, MD Referring MD:          Doris Cheadle. Dough (Referring MD) Medicines:             Propofol per Anesthesia Complications:         No immediate complications. Procedure:             Pre-Anesthesia Assessment:                        - Prior to the procedure, a History and Physical was                         performed, and patient medications and allergies were                         reviewed. The patient's tolerance of previous                         anesthesia was also reviewed. The risks and benefits                         of the procedure and the sedation options and risks                         were discussed with the patient. All questions were                         answered, and informed consent was obtained. Prior                         Anticoagulants: The patient has taken no anticoagulant                         or antiplatelet agents. ASA Grade Assessment: II - A                         patient with mild systemic disease. After reviewing                         the risks and benefits, the patient was deemed in                         satisfactory condition to undergo the procedure.                        After obtaining informed consent, the colonoscope was  passed under direct vision. Throughout the procedure,                         the patient's blood pressure, pulse, and oxygen                         saturations were monitored continuously. The                          Colonoscope was introduced through the anus and                         advanced to the the cecum, identified by appendiceal                         orifice and ileocecal valve. The colonoscopy was                         performed without difficulty. The patient tolerated                         the procedure well. The quality of the bowel                         preparation was good. Findings:      The perianal and digital rectal examinations were normal.      A 5 mm polyp was found in the transverse colon. The polyp was sessile.       The polyp was removed with a cold snare. Resection and retrieval were       complete.      Non-bleeding internal hemorrhoids were found during retroflexion. The       hemorrhoids were Grade I (internal hemorrhoids that do not prolapse). Impression:            - One 5 mm polyp in the transverse colon, removed with                         a cold snare. Resected and retrieved.                        - Non-bleeding internal hemorrhoids. Recommendation:        - Discharge patient to home.                        - Resume previous diet.                        - Continue present medications.                        - Await pathology results.                        - Repeat colonoscopy in 7 years for surveillance. Procedure Code(s):     --- Professional ---                        [phone], Colonoscopy, flexible; with removal of  tumor(s), polyp(s), or other lesion(s) by snare                         technique Diagnosis Code(s):     --- Professional ---                        Z86.010, Personal history of colonic polyps                        D12.3, Benign neoplasm of transverse colon (hepatic                         flexure or splenic flexure) CPT copyright 2022 American Medical Association. All rights reserved. The codes documented in this report are preliminary and upon coder review may  be revised to meet current compliance  requirements. Midge Minium MD, MD 04/05/2023 8:54:55 AM This report has been signed electronically. Number of Addenda: 0 Note Initiated On: 04/05/2023 8:35 AM Scope Withdrawal Time: 0 hours 7 minutes 16 seconds  Total Procedure Duration: 0 hours 13 minutes 39 seconds  Estimated Blood Loss:  Estimated blood loss: none.      [facility]

## 2023-04-07 ENCOUNTER — Encounter: Payer: Self-pay | Admitting: Gastroenterology

## 2023-04-09 ENCOUNTER — Telehealth: Payer: Self-pay | Admitting: Gastroenterology

## 2023-04-09 NOTE — Telephone Encounter (Signed)
Pt would like a call back to discuss results of procedure please return call

## 2023-04-10 NOTE — Telephone Encounter (Signed)
Went over path results with pt  She wanted to get advice on her external hemorrhoid as it gives her intermittent discomfort and she has only tried OTC Prep H with minimal relief  Additionally, pt reports some fecal incontinence and wanted advice on what she could possibly do to help with this  Pt is aware that you are out of the office and will likely get a call upon your return  Please advise

## 2023-04-16 ENCOUNTER — Encounter: Payer: Self-pay | Admitting: Oncology

## 2023-04-18 ENCOUNTER — Other Ambulatory Visit: Payer: Self-pay

## 2023-04-18 DIAGNOSIS — T7840XA Allergy, unspecified, initial encounter: Secondary | ICD-10-CM | POA: Insufficient documentation
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
## 2023-04-19 ENCOUNTER — Ambulatory Visit: Payer: 59 | Attending: Cardiology | Admitting: Cardiology

## 2023-04-19 ENCOUNTER — Encounter: Payer: Self-pay | Admitting: Cardiology

## 2023-04-19 ENCOUNTER — Other Ambulatory Visit: Payer: Self-pay

## 2023-04-19 VITALS — BP 126/78 | HR 79 | Ht 63.0 in | Wt 211.6 lb

## 2023-04-19 DIAGNOSIS — I10 Essential (primary) hypertension: Secondary | ICD-10-CM

## 2023-04-19 DIAGNOSIS — E782 Mixed hyperlipidemia: Secondary | ICD-10-CM

## 2023-04-19 DIAGNOSIS — I209 Angina pectoris, unspecified: Secondary | ICD-10-CM

## 2023-04-19 MED ORDER — NITROGLYCERIN 0.4 MG SL SUBL: 0.4000 mg | SUBLINGUAL_TABLET | SUBLINGUAL | 1 refills | Status: DC | PRN

## 2023-04-19 MED ORDER — NITROGLYCERIN 0.4 MG SL SUBL
0.4000 mg | SUBLINGUAL_TABLET | SUBLINGUAL | 11 refills | Status: DC | PRN
Start: 2023-04-19 — End: 2023-04-19

## 2023-04-19 NOTE — Patient Instructions (Signed)
Medication Instructions:  Your physician recommends that you continue on your current medications as directed. Please refer to the Current Medication list given to you today. *If you need a refill on your cardiac medications before your next appointment, please call your pharmacy*   Lab Work: None If you have labs (blood work) drawn today and your tests are completely normal, you will receive your results only by: MyChart Message (if you have MyChart) OR A paper copy in the mail If you have any lab test that is abnormal or we need to change your treatment, we will call you to review the results.   Testing/Procedures: None   Follow-Up: At [facility], you and your health needs are our priority.  As part of our continuing mission to provide you with exceptional heart care, we have created designated Provider Care Teams.  These Care Teams include your primary Cardiologist (physician) and Advanced Practice Providers (APPs -  Physician Assistants and Nurse Practitioners) who all work together to provide you with the care you need, when you need it.  We recommend signing up for the patient portal called "MyChart".  Sign up information is provided on this After Visit Summary.  MyChart is used to connect with patients for Virtual Visits (Telemedicine).  Patients are able to view lab/test results, encounter notes, upcoming appointments, etc.  Non-urgent messages can be sent to your provider as well.   To learn more about what you can do with MyChart, go to [email or web address].    Your next appointment:   1 year(s)  Provider:   Norman Herrlich, MD    Other Instructions Alleve one daily. You can take Tylenol twice a day.

## 2023-04-23 ENCOUNTER — Other Ambulatory Visit: Payer: Self-pay

## 2023-04-23 ENCOUNTER — Emergency Department: Payer: 59

## 2023-04-23 ENCOUNTER — Encounter: Payer: Self-pay | Admitting: Emergency Medicine

## 2023-04-23 ENCOUNTER — Emergency Department
Admission: EM | Admit: 2023-04-23 | Discharge: 2023-04-23 | Disposition: A | Discharge status: Home / Self Care | Payer: 59 | Attending: Emergency Medicine | Admitting: Emergency Medicine

## 2023-04-23 DIAGNOSIS — R1010 Upper abdominal pain, unspecified: Secondary | ICD-10-CM | POA: Diagnosis present

## 2023-04-23 DIAGNOSIS — K2970 Gastritis, unspecified, without bleeding: Secondary | ICD-10-CM | POA: Diagnosis not present

## 2023-04-23 LAB — COMPREHENSIVE METABOLIC PANEL
ALT: 25 U/L (ref 0–44)
AST: 22 U/L (ref 15–41)
Albumin: 3.8 g/dL (ref 3.5–5.0)
Alkaline Phosphatase: 63 U/L (ref 38–126)
Anion gap: 8 (ref 5–15)
BUN: 7 mg/dL (ref 6–20)
CO2: 24 mmol/L (ref 22–32)
Calcium: 8.7 mg/dL — ABNORMAL LOW (ref 8.9–10.3)
Chloride: 108 mmol/L (ref 98–111)
Creatinine, Ser: 0.94 mg/dL (ref 0.44–1.00)
GFR, Estimated: >60 mL/min (ref >60)
Glucose, Bld: 107 mg/dL — ABNORMAL HIGH (ref 70–99)
Potassium: 3.5 mmol/L (ref 3.5–5.1)
Sodium: 140 mmol/L (ref 135–145)
Total Bilirubin: 0.6 mg/dL (ref 0.3–1.2)
Total Protein: 7.2 g/dL (ref 6.5–8.1)

## 2023-04-23 LAB — CBC
HCT: 37 % (ref 36.0–46.0)
Hemoglobin: 12.6 g/dL (ref 12.0–15.0)
MCH: 30.8 pg (ref 26.0–34.0)
MCHC: 34.1 g/dL (ref 30.0–36.0)
MCV: 90.5 fL (ref 80.0–100.0)
Platelets: 265 10*3/uL (ref 150–400)
RBC: 4.09 MIL/uL (ref 3.87–5.11)
RDW: 13.3 % (ref 11.5–15.5)
WBC: 6.7 10*3/uL (ref 4.0–10.5)
nRBC: 0 % (ref 0.0–0.2)

## 2023-04-23 LAB — URINALYSIS, ROUTINE W REFLEX MICROSCOPIC
Bilirubin Urine: NEGATIVE
Glucose, UA: NEGATIVE mg/dL
Hgb urine dipstick: NEGATIVE
Ketones, ur: NEGATIVE mg/dL
Leukocytes,Ua: NEGATIVE
Nitrite: NEGATIVE
Protein, ur: NEGATIVE mg/dL
Specific Gravity, Urine: 1.009 (ref 1.005–1.030)
pH: 6 (ref 5.0–8.0)

## 2023-04-23 LAB — LIPASE, BLOOD: Lipase: 42 U/L (ref 11–51)

## 2023-04-23 MED ORDER — PANTOPRAZOLE SODIUM 40 MG PO TBEC: 40.0000 mg | DELAYED_RELEASE_TABLET | Freq: Every day | ORAL | 1 refills | Status: DC

## 2023-04-23 MED ORDER — SUCRALFATE 1 G PO TABS: 1.0000 g | ORAL_TABLET | Freq: Four times a day (QID) | ORAL | 0 refills | Status: DC

## 2023-04-23 MED ORDER — IOHEXOL 300 MG/ML  SOLN
100.0000 mL | Freq: Once | INTRAMUSCULAR | Status: AC | PRN
  Administered 2023-04-23: 100 mL via INTRAVENOUS

## 2023-04-23 MED ORDER — LIDOCAINE VISCOUS HCL 2 % MT SOLN
15.0000 mL | Freq: Once | OROMUCOSAL | Status: AC
  Administered 2023-04-23: 15 mL via ORAL
  Filled 2023-04-23: qty 15

## 2023-04-23 MED ORDER — LIDOCAINE VISCOUS HCL 2 % MT SOLN: 15.0000 mL | OROMUCOSAL | 0 refills | Status: DC | PRN

## 2023-04-23 MED ORDER — ALUM & MAG HYDROXIDE-SIMETH 200-200-20 MG/5ML PO SUSP
30.0000 mL | Freq: Once | ORAL | Status: AC
  Administered 2023-04-23: 30 mL via ORAL
  Filled 2023-04-23: qty 30

## 2023-04-23 NOTE — ED Provider Notes (Signed)
[facility] Provider Note    Event Date/Time   First MD Initiated Contact with Patient 04/23/23 1035     (approximate)   History   Abdominal Pain   HPI  [name] is a 52 y.o. female who presents to the emergency department today because of concerns for abdominal pain, vomiting blood hematuria.  The patient states that she had a fall a few days ago.  She did not think she hit her torso but landed on her elbows and knees.  Then 2 nights ago had some steak which still had some blood in it.  She then started developing the upper abdominal pain.  Located under her right breast.  Had episodes of bloody vomiting and then noticed some bloody urine as well.  Patient denies any fevers.     Physical Exam   Triage Vital Signs: ED Triage Vitals  Encounter Vitals Group     BP 04/23/23 1005 (!) 164/104     Systolic BP Percentile --      Diastolic BP Percentile --      Pulse Rate 04/23/23 1005 78     Resp 04/23/23 1005 17     Temp 04/23/23 1005 98.5 F (36.9 C)     Temp Source 04/23/23 1005 Oral     SpO2 04/23/23 1005 98 %     Weight 04/23/23 1007 205 lb (93 kg)     Height 04/23/23 1007 5\' 3"  (1.6 m)     Head Circumference --      Peak Flow --      Pain Score 04/23/23 1007 8     Pain Loc --      Pain Education --      Exclude from Growth Chart --     Most recent vital signs: Vitals:   04/23/23 1005  BP: (!) 164/104  Pulse: 78  Resp: 17  Temp: 98.5 F (36.9 C)  SpO2: 98%   General: Awake, alert, oriented. CV:  Good peripheral perfusion. Regular rate and rhythm. Resp:  Normal effort. Lungs clear. Abd:  No distention. Tender to palpation in the right upper quadrant  ED Results / Procedures / Treatments   Labs (all labs ordered are listed, but only abnormal results are displayed) Labs Reviewed  COMPREHENSIVE METABOLIC PANEL - Abnormal; Notable for the following components:      Result Value   Glucose, Bld 107 (*)    Calcium 8.7 (*)     All other components within normal limits  URINALYSIS, ROUTINE W REFLEX MICROSCOPIC - Abnormal; Notable for the following components:   Color, Urine YELLOW (*)    APPearance HAZY (*)    All other components within normal limits  LIPASE, BLOOD  CBC     EKG  I, Phineas Semen, attending physician, personally viewed and interpreted this EKG  EKG Time: 1009 Rate: 69 Rhythm: normal sinus rhythm Axis: normal Intervals: qtc 432 QRS: narrow, q waves III ST changes: no st elevation Impression: abnormal ekg    RADIOLOGY I independently interpreted and visualized the CT abd/pel. My interpretation: No free air Radiology interpretation:  IMPRESSION:  1. No acute inflammatory process identified within the abdomen or  pelvis.  2. Status post cholecystectomy.  3. Multiple other nonacute observations, as described above.    Aortic Atherosclerosis (ICD10-I70.0      PROCEDURES:  Critical Care performed: No    MEDICATIONS ORDERED IN ED: Medications - No data to display   IMPRESSION / MDM / ASSESSMENT  AND PLAN / ED COURSE  I reviewed the triage vital signs and the nursing notes.                              Differential diagnosis includes, but is not limited to, hepatitis, pancreatitis, gastroenteritis, UTI, kidney stone  Patient's presentation is most consistent with acute presentation with potential threat to life or bodily function.   The patient is on the cardiac monitor to evaluate for evidence of arrhythmia and/or significant heart rate changes.  Patient presents to the emergency department today because of concerns for abdominal pain, vomiting blood and bloody urine.  On exam she is somewhat tender in the right upper quadrant.  She is status postcholecystectomy.  The patient blood work without concerning leukocytosis, elevation of lipase or elevated LFTs.  Will obtain CT scan to evaluate for further causes of pain.  CT scan without any acute concerning findings.  Did  try GI cocktail which did give patient some relief.  At this time I do think likely gastritis/duodenitis/PUD.  Discussed this with the patient.  Will plan on discharging with medication to help with the symptoms and dietary guidelines.      FINAL CLINICAL IMPRESSION(S) / ED DIAGNOSES   Final diagnoses:  Gastritis, presence of bleeding unspecified, unspecified chronicity, unspecified gastritis type    Note:  This document was prepared using Dragon voice recognition software and may include unintentional dictation errors.    Phineas Semen, MD 04/23/23 [phone]

## 2023-04-23 NOTE — ED Triage Notes (Signed)
Patient to ED via POV for upper abd pain. Patient states she has been vomiting and urinating blood since yesterday.

## 2023-04-23 NOTE — ED Notes (Signed)
PO challenge passed 

## 2023-04-23 NOTE — Discharge Instructions (Signed)
Please seek medical attention for any high fevers, chest pain, shortness of breath, change in behavior, persistent vomiting, bloody stool or any other new or concerning symptoms.  

## 2023-04-30 ENCOUNTER — Encounter: Payer: Self-pay | Admitting: Podiatry

## 2023-04-30 ENCOUNTER — Ambulatory Visit: Payer: 59 | Admitting: Podiatry

## 2023-04-30 DIAGNOSIS — D2372 Other benign neoplasm of skin of left lower limb, including hip: Secondary | ICD-10-CM

## 2023-04-30 DIAGNOSIS — D2371 Other benign neoplasm of skin of right lower limb, including hip: Secondary | ICD-10-CM | POA: Diagnosis not present
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
## 2023-05-01 ENCOUNTER — Ambulatory Visit: Payer: 59 | Admitting: Gastroenterology

## 2023-05-14 ENCOUNTER — Encounter: Payer: Self-pay | Admitting: Nurse Practitioner

## 2023-05-14 ENCOUNTER — Ambulatory Visit (INDEPENDENT_AMBULATORY_CARE_PROVIDER_SITE_OTHER): Payer: 59 | Admitting: Nurse Practitioner

## 2023-05-14 VITALS — BP 120/88 | HR 99 | Temp 98.1°F | Resp 16 | Ht 63.0 in | Wt 211.4 lb

## 2023-05-14 DIAGNOSIS — U099 Post covid-19 condition, unspecified: Secondary | ICD-10-CM

## 2023-05-14 DIAGNOSIS — J4540 Moderate persistent asthma, uncomplicated: Secondary | ICD-10-CM | POA: Diagnosis not present

## 2023-05-14 DIAGNOSIS — R0602 Shortness of breath: Secondary | ICD-10-CM | POA: Diagnosis not present

## 2023-05-14 MED ORDER — ALBUTEROL SULFATE HFA 108 (90 BASE) MCG/ACT IN AERS
2.0000 | INHALATION_SPRAY | Freq: Four times a day (QID) | RESPIRATORY_TRACT | 2 refills | Status: DC | PRN
Start: 2023-05-14 — End: 2023-11-14
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
## 2023-05-15 ENCOUNTER — Other Ambulatory Visit: Payer: Self-pay | Admitting: Internal Medicine

## 2023-05-15 DIAGNOSIS — J4520 Mild intermittent asthma, uncomplicated: Secondary | ICD-10-CM

## 2023-06-04 ENCOUNTER — Ambulatory Visit
Admission: RE | Admit: 2023-06-04 | Discharge: 2023-06-04 | Disposition: A | Discharge status: Home / Self Care | Payer: 59 | Source: Ambulatory Visit | Attending: Student | Admitting: Student

## 2023-06-04 ENCOUNTER — Other Ambulatory Visit: Payer: Self-pay | Admitting: Student

## 2023-06-04 DIAGNOSIS — R42 Dizziness and giddiness: Secondary | ICD-10-CM

## 2023-06-07 ENCOUNTER — Other Ambulatory Visit: Payer: Self-pay | Admitting: Nurse Practitioner

## 2023-06-07 DIAGNOSIS — J4540 Moderate persistent asthma, uncomplicated: Secondary | ICD-10-CM

## 2023-06-07 DIAGNOSIS — U099 Post covid-19 condition, unspecified: Secondary | ICD-10-CM
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
## 2023-06-11 ENCOUNTER — Telehealth: Payer: Self-pay

## 2023-06-11 ENCOUNTER — Ambulatory Visit: Payer: 59 | Attending: Cardiology

## 2023-06-11 ENCOUNTER — Ambulatory Visit: Payer: 59 | Attending: Cardiology | Admitting: Cardiology

## 2023-06-11 ENCOUNTER — Encounter: Payer: Self-pay | Admitting: Cardiology

## 2023-06-11 VITALS — BP 132/86 | HR 72 | Ht 63.0 in | Wt 210.8 lb

## 2023-06-11 DIAGNOSIS — I209 Angina pectoris, unspecified: Secondary | ICD-10-CM

## 2023-06-11 DIAGNOSIS — I10 Essential (primary) hypertension: Secondary | ICD-10-CM

## 2023-06-11 DIAGNOSIS — E782 Mixed hyperlipidemia: Secondary | ICD-10-CM

## 2023-06-11 DIAGNOSIS — R4020 Unspecified coma: Secondary | ICD-10-CM

## 2023-06-11 NOTE — Telephone Encounter (Signed)
Called patient and explained that the green light should only light up when you first push the button in the office. After that the green light should not light up. Patient verbalized understanding and had no further questions at this time.

## 2023-06-11 NOTE — Addendum Note (Signed)
Addended by: Roxanne Mins I on: 06/11/2023 10:34 AM   Modules accepted: Orders

## 2023-06-11 NOTE — Telephone Encounter (Signed)
Patient came in office confused, stating that she was told when she hit the button on her ZIO monitor to record an event that it was suppose to turn green. She hit the button and nothing lit up. I went back and asked Abbie if it was suppose to turn green when the button was pushed and she said no. I told the patient and she was still confused and asked for a call back. CB # [phone]

## 2023-06-11 NOTE — Patient Instructions (Signed)
Medication Instructions:  Your physician recommends that you continue on your current medications as directed. Please refer to the Current Medication list given to you today.  *If you need a refill on your cardiac medications before your next appointment, please call your pharmacy*   Lab Work: None If you have labs (blood work) drawn today and your tests are completely normal, you will receive your results only by: MyChart Message (if you have MyChart) OR A paper copy in the mail If you have any lab test that is abnormal or we need to change your treatment, we will call you to review the results.   Testing/Procedures: A zio monitor was ordered today. It will remain on for 7 days. You will then return monitor and event diary in provided box. It takes 1-2 weeks for report to be downloaded and returned to [place]. We will call you with the results. If monitor falls off or has orange flashing light, please call Zio for further instructions.     Follow-Up: At [facility], you and your health needs are our priority.  As part of our continuing mission to provide you with exceptional heart care, we have created designated Provider Care Teams.  These Care Teams include your primary Cardiologist (physician) and Advanced Practice Providers (APPs -  Physician Assistants and Nurse Practitioners) who all work together to provide you with the care you need, when you need it.  We recommend signing up for the patient portal called "MyChart".  Sign up information is provided on this After Visit Summary.  MyChart is used to connect with patients for Virtual Visits (Telemedicine).  Patients are able to view lab/test results, encounter notes, upcoming appointments, etc.  Non-urgent messages can be sent to your provider as well.   To learn more about what you can do with MyChart, go to [email or web address].    Your next appointment:   3 month(s)  Provider:   Norman Herrlich, MD    Other  Instructions None

## 2023-06-17 ENCOUNTER — Other Ambulatory Visit: Payer: Self-pay | Admitting: Cardiology

## 2023-07-05 ENCOUNTER — Encounter: Payer: Self-pay | Admitting: Cardiology

## 2023-07-19 ENCOUNTER — Encounter: Payer: Self-pay | Admitting: Oncology

## 2023-07-23 ENCOUNTER — Ambulatory Visit (INDEPENDENT_AMBULATORY_CARE_PROVIDER_SITE_OTHER): Payer: 59 | Admitting: Podiatry

## 2023-07-23 ENCOUNTER — Encounter: Payer: Self-pay | Admitting: Podiatry

## 2023-07-23 DIAGNOSIS — L600 Ingrowing nail: Secondary | ICD-10-CM | POA: Diagnosis not present

## 2023-07-23 DIAGNOSIS — D2372 Other benign neoplasm of skin of left lower limb, including hip: Secondary | ICD-10-CM

## 2023-07-23 DIAGNOSIS — D2371 Other benign neoplasm of skin of right lower limb, including hip: Secondary | ICD-10-CM | POA: Diagnosis not present

## 2023-07-23 MED ORDER — NEOMYCIN-POLYMYXIN-HC 1 % OT SOLN: OTIC | 1 refills | Status: DC

## 2023-07-23 NOTE — Progress Notes (Signed)
She presents today chief concern of painful nails hallux bilateral had a removed once several years ago in [place] she states that they have grown back and become painful.  Objective: Pulses are palpable.  There is mild erythema along the tibia and fibular border of the hallux bilaterally and there is a portion of the nail that is still present most likely from the nailbed.  These are still painful on palpation.  Assessment: Ingrown nail hallux bilateral.  Plan: Chemical matricectomy's were performed today total.  Tolerated procedure well without complication was given both oral written home-going instructions.  I will follow-up with her in 2 weeks

## 2023-07-23 NOTE — Patient Instructions (Signed)

## 2023-07-24 ENCOUNTER — Telehealth: Payer: Self-pay | Admitting: Podiatry

## 2023-07-24 NOTE — Telephone Encounter (Signed)
Patient left her medication in her buggy at the store ( that you prescribed on 11/4, Neamycin-poly solution).  Is asking for another prescription.  If insurance won't pay for it again, can something else that is similar be called in so insurance will cover it.

## 2023-07-25 MED ORDER — NEOMYCIN-POLYMYXIN-HC 1 % OT SOLN
OTIC | 1 refills | Status: DC
Start: 2023-07-25 — End: 2023-09-04

## 2023-08-06 ENCOUNTER — Ambulatory Visit (INDEPENDENT_AMBULATORY_CARE_PROVIDER_SITE_OTHER): Payer: 59 | Admitting: Podiatry

## 2023-08-06 ENCOUNTER — Encounter: Payer: Self-pay | Admitting: Podiatry

## 2023-08-06 DIAGNOSIS — L600 Ingrowing nail: Secondary | ICD-10-CM

## 2023-08-06 DIAGNOSIS — Z9889 Other specified postprocedural states: Secondary | ICD-10-CM

## 2023-08-06 NOTE — Progress Notes (Signed)
She presents today for nail check hallux bilateral nail avulsions.  Objective: Vital signs are stable alert oriented x 3.  Pulses are palpable.  There is no erythema edema cellulitis drainage or odor.  Scab is intact no signs of infection.  Assessment: Well-healing nail avulsion hallux.  Plan: Recommend that she continue soaking twice daily cover during the day leave open at bedtime follow-up with me as needed

## 2023-08-28 ENCOUNTER — Encounter: Payer: Self-pay | Admitting: Oncology

## 2023-09-04 ENCOUNTER — Ambulatory Visit (INDEPENDENT_AMBULATORY_CARE_PROVIDER_SITE_OTHER): Payer: 59 | Admitting: Cardiology

## 2023-09-04 ENCOUNTER — Encounter: Payer: Self-pay | Admitting: Cardiology

## 2023-09-04 ENCOUNTER — Encounter: Payer: Self-pay | Admitting: Oncology

## 2023-09-04 VITALS — BP 108/76 | HR 69 | Ht 63.0 in | Wt 213.6 lb

## 2023-09-04 DIAGNOSIS — Z1329 Encounter for screening for other suspected endocrine disorder: Secondary | ICD-10-CM

## 2023-09-04 DIAGNOSIS — I10 Essential (primary) hypertension: Secondary | ICD-10-CM | POA: Diagnosis not present

## 2023-09-04 DIAGNOSIS — Z7689 Persons encountering health services in other specified circumstances: Secondary | ICD-10-CM

## 2023-09-04 DIAGNOSIS — E782 Mixed hyperlipidemia: Secondary | ICD-10-CM | POA: Diagnosis not present

## 2023-09-04 DIAGNOSIS — Z131 Encounter for screening for diabetes mellitus: Secondary | ICD-10-CM

## 2023-09-04 MED ORDER — CARBAMIDE PEROXIDE 6.5 % OT SOLN: 5.0000 [drp] | Freq: Two times a day (BID) | OTIC | 2 refills | Status: DC
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
## 2023-09-05 LAB — CBC WITH DIFFERENTIAL/PLATELET
Basophils Absolute: 0 10*3/uL (ref 0.0–0.2)
Basos: 1 %
EOS (ABSOLUTE): 0.2 10*3/uL (ref 0.0–0.4)
Eos: 3 %
Hematocrit: 38.2 % (ref 34.0–46.6)
Hemoglobin: 12.7 g/dL (ref 11.1–15.9)
Immature Grans (Abs): 0 10*3/uL (ref 0.0–0.1)
Immature Granulocytes: 0 %
Lymphocytes Absolute: 1.8 10*3/uL (ref 0.7–3.1)
Lymphs: 24 %
MCH: 30.5 pg (ref 26.6–33.0)
MCHC: 33.2 g/dL (ref 31.5–35.7)
MCV: 92 fL (ref 79–97)
Monocytes Absolute: 0.2 10*3/uL (ref 0.1–0.9)
Monocytes: 3 %
Neutrophils Absolute: 5.2 10*3/uL (ref 1.4–7.0)
Neutrophils: 69 %
Platelets: 245 10*3/uL (ref 150–450)
RBC: 4.17 x10E6/uL (ref 3.77–5.28)
RDW: 13.5 % (ref 11.7–15.4)
WBC: 7.5 10*3/uL (ref 3.4–10.8)

## 2023-09-05 LAB — CMP14+EGFR
ALT: 23 [IU]/L (ref 0–32)
AST: 21 [IU]/L (ref 0–40)
Albumin: 4.4 g/dL (ref 3.8–4.9)
Alkaline Phosphatase: 74 [IU]/L (ref 44–121)
BUN/Creatinine Ratio: 7 — ABNORMAL LOW (ref 9–23)
BUN: 8 mg/dL (ref 6–24)
Bilirubin Total: 0.6 mg/dL (ref 0.0–1.2)
CO2: 22 mmol/L (ref 20–29)
Calcium: 9.7 mg/dL (ref 8.7–10.2)
Chloride: 107 mmol/L — ABNORMAL HIGH (ref 96–106)
Creatinine, Ser: 1.23 mg/dL — ABNORMAL HIGH (ref 0.57–1.00)
Globulin, Total: 2.3 g/dL (ref 1.5–4.5)
Glucose: 86 mg/dL (ref 70–99)
Potassium: 3.9 mmol/L (ref 3.5–5.2)
Sodium: 144 mmol/L (ref 134–144)
Total Protein: 6.7 g/dL (ref 6.0–8.5)
eGFR: 52 mL/min/{1.73_m2} — ABNORMAL LOW (ref >59)

## 2023-09-05 LAB — LIPID PANEL
Chol/HDL Ratio: 2.3 {ratio} (ref 0.0–4.4)
Cholesterol, Total: 128 mg/dL (ref 100–199)
HDL: 55 mg/dL (ref >39)
LDL Chol Calc (NIH): 52 mg/dL (ref 0–99)
Triglycerides: 116 mg/dL (ref 0–149)
VLDL Cholesterol Cal: 21 mg/dL (ref 5–40)

## 2023-09-05 LAB — TSH: TSH: 2.49 u[IU]/mL (ref 0.450–4.500)

## 2023-09-05 LAB — HEMOGLOBIN A1C
Est. average glucose Bld gHb Est-mCnc: 108 mg/dL
Hgb A1c MFr Bld: 5.4 % (ref 4.8–5.6)

## 2023-09-10 ENCOUNTER — Encounter: Payer: Self-pay | Admitting: Oncology

## 2023-09-11 ENCOUNTER — Encounter: Payer: Self-pay | Admitting: Oncology
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
## 2023-09-17 ENCOUNTER — Ambulatory Visit: Payer: 59 | Attending: Cardiology | Admitting: Cardiology

## 2023-09-17 ENCOUNTER — Encounter: Payer: Self-pay | Admitting: Cardiology

## 2023-09-17 ENCOUNTER — Encounter: Payer: Self-pay | Admitting: Oncology

## 2023-09-17 VITALS — BP 120/70 | HR 70 | Ht 63.0 in | Wt 210.2 lb

## 2023-09-17 DIAGNOSIS — I10 Essential (primary) hypertension: Secondary | ICD-10-CM

## 2023-09-17 DIAGNOSIS — I209 Angina pectoris, unspecified: Secondary | ICD-10-CM | POA: Diagnosis not present

## 2023-09-17 DIAGNOSIS — R4020 Unspecified coma: Secondary | ICD-10-CM

## 2023-09-17 DIAGNOSIS — E782 Mixed hyperlipidemia: Secondary | ICD-10-CM | POA: Diagnosis not present

## 2023-09-17 MED ORDER — LOSARTAN POTASSIUM-HCTZ 50-12.5 MG PO TABS: 1.0000 | ORAL_TABLET | Freq: Every day | ORAL | 3 refills | Status: DC

## 2023-09-17 NOTE — Patient Instructions (Signed)
Medication Instructions:  Your physician recommends that you continue on your current medications as directed. Please refer to the Current Medication list given to you today.  *If you need a refill on your cardiac medications before your next appointment, please call your pharmacy*   Lab Work: None ordered If you have labs (blood work) drawn today and your tests are completely normal, you will receive your results only by: MyChart Message (if you have MyChart) OR A paper copy in the mail If you have any lab test that is abnormal or we need to change your treatment, we will call you to review the results.   Testing/Procedures: None ordered   Follow-Up: At [facility], you and your health needs are our priority.  As part of our continuing mission to provide you with exceptional heart care, we have created designated Provider Care Teams.  These Care Teams include your primary Cardiologist (physician) and Advanced Practice Providers (APPs -  Physician Assistants and Nurse Practitioners) who all work together to provide you with the care you need, when you need it.  We recommend signing up for the patient portal called "MyChart".  Sign up information is provided on this After Visit Summary.  MyChart is used to connect with patients for Virtual Visits (Telemedicine).  Patients are able to view lab/test results, encounter notes, upcoming appointments, etc.  Non-urgent messages can be sent to your provider as well.   To learn more about what you can do with MyChart, go to [email or web address].    Your next appointment:   12 month(s)  The format for your next appointment:   In Person  Provider:   Norman Herrlich, MD    Other Instructions none  Important Information About Sugar

## 2023-09-26 ENCOUNTER — Encounter: Payer: Self-pay | Admitting: Oncology

## 2023-09-27 DIAGNOSIS — Q031 Atresia of foramina of Magendie and Luschka: Secondary | ICD-10-CM | POA: Diagnosis not present

## 2023-09-27 DIAGNOSIS — G918 Other hydrocephalus: Secondary | ICD-10-CM | POA: Diagnosis not present

## 2023-10-03 ENCOUNTER — Ambulatory Visit: Payer: Medicaid Other

## 2023-10-05 ENCOUNTER — Other Ambulatory Visit: Payer: Self-pay

## 2023-10-05 MED ORDER — EZETIMIBE 10 MG PO TABS: 10.0000 mg | ORAL_TABLET | Freq: Every day | ORAL | 1 refills | Status: DC

## 2023-10-05 MED ORDER — BUPROPION HCL ER (XL) 300 MG PO TB24: 300.0000 mg | ORAL_TABLET | Freq: Every day | ORAL | 1 refills | Status: DC

## 2023-10-24 ENCOUNTER — Other Ambulatory Visit: Payer: Self-pay | Admitting: Gastroenterology

## 2023-11-05 ENCOUNTER — Ambulatory Visit: Payer: Medicaid Other | Admitting: Cardiology

## 2023-11-07 ENCOUNTER — Encounter: Payer: Self-pay | Admitting: Oncology

## 2023-11-12 ENCOUNTER — Encounter: Payer: Self-pay | Admitting: Oncology

## 2023-11-14 ENCOUNTER — Ambulatory Visit (INDEPENDENT_AMBULATORY_CARE_PROVIDER_SITE_OTHER): Payer: PPO | Admitting: Nurse Practitioner

## 2023-11-14 ENCOUNTER — Encounter: Payer: Self-pay | Admitting: Nurse Practitioner

## 2023-11-14 VITALS — BP 132/88 | HR 78 | Temp 98.5°F | Resp 16 | Ht 63.0 in | Wt 214.4 lb

## 2023-11-14 DIAGNOSIS — J4530 Mild persistent asthma, uncomplicated: Secondary | ICD-10-CM | POA: Diagnosis not present

## 2023-11-14 DIAGNOSIS — R0602 Shortness of breath: Secondary | ICD-10-CM

## 2023-11-14 MED ORDER — IPRATROPIUM-ALBUTEROL 0.5-2.5 (3) MG/3ML IN SOLN: 3.0000 mL | Freq: Four times a day (QID) | RESPIRATORY_TRACT | 1 refills | Status: DC | PRN

## 2023-11-14 MED ORDER — ALBUTEROL SULFATE HFA 108 (90 BASE) MCG/ACT IN AERS: 2.0000 | INHALATION_SPRAY | Freq: Four times a day (QID) | RESPIRATORY_TRACT | 2 refills | Status: DC | PRN

## 2023-11-14 MED ORDER — BREZTRI AEROSPHERE 160-9-4.8 MCG/ACT IN AERO: 2.0000 | INHALATION_SPRAY | Freq: Two times a day (BID) | RESPIRATORY_TRACT | 11 refills | Status: DC
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
## 2023-11-19 DIAGNOSIS — G43719 Chronic migraine without aura, intractable, without status migrainosus: Secondary | ICD-10-CM | POA: Diagnosis not present

## 2023-11-19 DIAGNOSIS — R569 Unspecified convulsions: Secondary | ICD-10-CM | POA: Diagnosis not present

## 2023-11-19 DIAGNOSIS — R1031 Right lower quadrant pain: Secondary | ICD-10-CM | POA: Diagnosis not present

## 2023-12-18 ENCOUNTER — Ambulatory Visit (INDEPENDENT_AMBULATORY_CARE_PROVIDER_SITE_OTHER): Admitting: Cardiology

## 2023-12-18 ENCOUNTER — Ambulatory Visit (INDEPENDENT_AMBULATORY_CARE_PROVIDER_SITE_OTHER)

## 2023-12-18 ENCOUNTER — Encounter: Payer: Self-pay | Admitting: Cardiology

## 2023-12-18 ENCOUNTER — Encounter: Payer: Self-pay | Admitting: Oncology

## 2023-12-18 VITALS — BP 114/80 | HR 89 | Ht 63.0 in | Wt 209.0 lb

## 2023-12-18 DIAGNOSIS — R1011 Right upper quadrant pain: Secondary | ICD-10-CM | POA: Diagnosis not present

## 2023-12-18 DIAGNOSIS — Z0130 Encounter for examination of blood pressure without abnormal findings: Secondary | ICD-10-CM
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
## 2023-12-31 ENCOUNTER — Ambulatory Visit
Admission: RE | Admit: 2023-12-31 | Discharge: 2023-12-31 | Disposition: A | Discharge status: Home / Self Care | Payer: Self-pay | Source: Ambulatory Visit | Attending: Internal Medicine | Admitting: Internal Medicine

## 2023-12-31 ENCOUNTER — Telehealth: Payer: Self-pay

## 2023-12-31 ENCOUNTER — Other Ambulatory Visit: Payer: Self-pay

## 2023-12-31 ENCOUNTER — Encounter: Payer: Self-pay | Admitting: Oncology

## 2023-12-31 ENCOUNTER — Ambulatory Visit (INDEPENDENT_AMBULATORY_CARE_PROVIDER_SITE_OTHER): Payer: Self-pay | Admitting: Internal Medicine

## 2023-12-31 ENCOUNTER — Ambulatory Visit
Admission: RE | Admit: 2023-12-31 | Discharge: 2023-12-31 | Disposition: A | Discharge status: Home / Self Care | Payer: Self-pay | Attending: Internal Medicine | Admitting: Internal Medicine

## 2023-12-31 VITALS — BP 110/78 | HR 67 | Temp 98.3°F | Resp 16 | Ht 63.0 in | Wt 206.0 lb

## 2023-12-31 DIAGNOSIS — M25562 Pain in left knee: Secondary | ICD-10-CM

## 2023-12-31 DIAGNOSIS — M25462 Effusion, left knee: Secondary | ICD-10-CM | POA: Diagnosis not present

## 2023-12-31 DIAGNOSIS — K219 Gastro-esophageal reflux disease without esophagitis: Secondary | ICD-10-CM | POA: Diagnosis not present

## 2023-12-31 DIAGNOSIS — J4540 Moderate persistent asthma, uncomplicated: Secondary | ICD-10-CM | POA: Diagnosis not present

## 2023-12-31 DIAGNOSIS — J4530 Mild persistent asthma, uncomplicated: Secondary | ICD-10-CM | POA: Diagnosis not present

## 2023-12-31 MED ORDER — IPRATROPIUM-ALBUTEROL 0.5-2.5 (3) MG/3ML IN SOLN
3.0000 mL | Freq: Four times a day (QID) | RESPIRATORY_TRACT | 1 refills | Status: AC | PRN
Start: 2023-12-31 — End: ?

## 2023-12-31 NOTE — Telephone Encounter (Signed)
 [employer] [facility] pt called that her insurance not covered nebulizer due to pt had on 2023 they covered after 5 years pt advised that insurance unable to covered portable due pt had nebulizer on 2023

## 2023-12-31 NOTE — Patient Instructions (Signed)
 Asthma, Adult  Asthma is a condition that causes swelling and narrowing of the airways. These are the passages that lead from the nose and mouth down into the lungs. When asthma symptoms get worse it is called an asthma attack or flare. This can make it hard to breathe. Asthma flares can range from minor to life-threatening. There is no cure for asthma, but medicines and lifestyle changes can help to control it. What are the causes? It is not known exactly what causes asthma, but certain things can cause asthma symptoms to get worse (triggers). What can trigger an asthma attack? Cigarette smoke. Mold. Dust. Your pet's skin flakes (dander). Cockroaches. Pollen. Air pollution (like household cleaners, wood smoke, smog, or Therapist, occupational). What are the signs or symptoms? Trouble breathing (shortness of breath). Coughing. Making high-pitched whistling sounds when you breathe, most often when you breathe out (wheezing). Chest tightness. Tiredness with little activity. Poor exercise tolerance. How is this treated? Controller medicines that help prevent asthma symptoms. Fast-acting reliever or rescue medicines. These give short-term relief of asthma symptoms. Allergy medicines if your attacks are brought on by allergens. Medicines to help control the body's defense (immune) system. Staying away from the things that cause asthma attacks. Follow these instructions at home: Avoiding triggers in your home Do not allow anyone to smoke in your home. Limit use of fireplaces and wood stoves. Get rid of pests (such as roaches and mice) and their droppings. Keep your home clean. Clean your floors. Dust regularly. Use cleaning products that do not smell. Wash bed sheets and blankets every week in hot water. Dry them in a dryer. Have someone vacuum when you are not home. Change your heating and air conditioning filters often. Use blankets that are made of polyester or cotton. General  instructions Take over-the-counter and prescription medicines only as told by your doctor. Do not smoke or use any products that contain nicotine or tobacco. If you need help quitting, ask your doctor. Stay away from secondhand smoke. Avoid doing things outdoors when allergen counts are high and when air quality is low. Warm up before you exercise. Take time to cool down after exercise. Use a peak flow meter as told by your doctor. A peak flow meter is a tool that measures how well your lungs are working. Keep track of the peak flow meter's readings. Write them down. Follow your asthma action plan. This is a written plan for taking care of your asthma and treating your attacks. Make sure you get all the shots (vaccines) that your doctor recommends. Ask your doctor about a flu shot and a pneumonia shot. Keep all follow-up visits. Contact a doctor if: You have wheezing, shortness of breath, or a cough even while taking medicine to prevent attacks. The mucus you cough up (sputum) is thicker than usual. The mucus you cough up changes from clear or white to yellow, green, gray, or is bloody. You have problems from the medicine you are taking, such as: A rash. Itching. Swelling. Trouble breathing. You need reliever medicines more than 2-3 times a week. Your peak flow reading is still at 50-79% of your personal best after following the action plan for 1 hour. You have a fever. Get help right away if: You seem to be worse and are not responding to medicine during an asthma attack. You are short of breath even at rest. You get short of breath when doing very little activity. You have trouble eating, drinking, or talking. You have chest  pain or tightness. You have a fast heartbeat. Your lips or fingernails start to turn blue. You are light-headed or dizzy, or you faint. Your peak flow is less than 50% of your personal best. You feel too tired to breathe normally. These symptoms may be an  emergency. Get help right away. Call 911. Do not wait to see if the symptoms will go away. Do not drive yourself to the hospital. Summary Asthma is a long-term (chronic) condition in which the airways get tight and narrow. An asthma attack can make it hard to breathe. Asthma cannot be cured, but medicines and lifestyle changes can help control it. Make sure you understand how to avoid triggers and how and when to use your medicines. Avoid things that can cause allergy symptoms (allergens). These include animal skin flakes (dander) and pollen from trees or grass. Avoid things that pollute the air. These may include household cleaners, wood smoke, smog, or chemical odors. This information is not intended to replace advice given to you by your health care provider. Make sure you discuss any questions you have with your health care provider. Document Revised: 06/13/2021 Document Reviewed: 06/13/2021 Elsevier Patient Education  2024 [employer].

## 2023-12-31 NOTE — Telephone Encounter (Signed)
 Sent message to [facility] for portable Nebulizer
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
## 2024-01-02 ENCOUNTER — Encounter: Payer: Self-pay | Admitting: Cardiology

## 2024-01-02 ENCOUNTER — Ambulatory Visit

## 2024-01-02 ENCOUNTER — Ambulatory Visit (INDEPENDENT_AMBULATORY_CARE_PROVIDER_SITE_OTHER): Admitting: Cardiology

## 2024-01-02 VITALS — BP 160/80 | HR 89 | Ht 63.0 in | Wt 214.4 lb

## 2024-01-02 DIAGNOSIS — H9202 Otalgia, left ear: Secondary | ICD-10-CM

## 2024-01-02 DIAGNOSIS — J0140 Acute pansinusitis, unspecified: Secondary | ICD-10-CM

## 2024-01-02 DIAGNOSIS — R051 Acute cough: Secondary | ICD-10-CM

## 2024-01-02 DIAGNOSIS — I10 Essential (primary) hypertension: Secondary | ICD-10-CM

## 2024-01-02 DIAGNOSIS — H6692 Otitis media, unspecified, left ear: Secondary | ICD-10-CM

## 2024-01-02 DIAGNOSIS — E559 Vitamin D deficiency, unspecified: Secondary | ICD-10-CM

## 2024-01-02 MED ORDER — FLUTICASONE PROPIONATE 50 MCG/ACT NA SUSP: 1.0000 | Freq: Every day | NASAL | 2 refills | Status: AC

## 2024-01-02 MED ORDER — CYANOCOBALAMIN 1000 MCG/ML IJ SOLN
1000.0000 ug | Freq: Once | INTRAMUSCULAR | Status: AC
  Administered 2024-01-02: 1000 ug via INTRAMUSCULAR

## 2024-01-02 MED ORDER — AZITHROMYCIN 250 MG PO TABS: ORAL_TABLET | ORAL | 0 refills | Status: AC

## 2024-01-02 MED ORDER — CARBAMIDE PEROXIDE 6.5 % OT SOLN: 5.0000 [drp] | Freq: Two times a day (BID) | OTIC | 2 refills | Status: DC
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
## 2024-01-14 ENCOUNTER — Encounter: Payer: Self-pay | Admitting: Cardiology

## 2024-01-14 ENCOUNTER — Ambulatory Visit (INDEPENDENT_AMBULATORY_CARE_PROVIDER_SITE_OTHER): Admitting: Cardiology

## 2024-01-14 ENCOUNTER — Encounter: Payer: Self-pay | Admitting: Oncology

## 2024-01-14 VITALS — BP 138/96 | HR 78 | Ht 63.0 in | Wt 216.2 lb

## 2024-01-14 DIAGNOSIS — H9202 Otalgia, left ear: Secondary | ICD-10-CM | POA: Insufficient documentation

## 2024-01-14 DIAGNOSIS — H9192 Unspecified hearing loss, left ear: Secondary | ICD-10-CM

## 2024-01-14 DIAGNOSIS — I10 Essential (primary) hypertension: Secondary | ICD-10-CM

## 2024-01-14 HISTORY — DX: Otalgia, left ear: H92.02

## 2024-01-14 HISTORY — DX: Unspecified hearing loss, left ear: H91.92

## 2024-01-14 MED ORDER — METHYLPREDNISOLONE 4 MG PO TBPK: ORAL_TABLET | ORAL | 0 refills | Status: DC

## 2024-01-14 MED ORDER — ONDANSETRON 4 MG PO TBDP: 4.0000 mg | ORAL_TABLET | ORAL | 0 refills | Status: DC | PRN
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
## 2024-01-16 ENCOUNTER — Encounter: Payer: Self-pay | Admitting: Emergency Medicine

## 2024-01-16 ENCOUNTER — Emergency Department

## 2024-01-16 ENCOUNTER — Encounter: Payer: Self-pay | Admitting: Oncology

## 2024-01-16 ENCOUNTER — Emergency Department: Admission: EM | Admit: 2024-01-16 | Discharge: 2024-01-16 | Disposition: A | Discharge status: Home / Self Care

## 2024-01-16 ENCOUNTER — Other Ambulatory Visit: Payer: Self-pay

## 2024-01-16 DIAGNOSIS — J45909 Unspecified asthma, uncomplicated: Secondary | ICD-10-CM | POA: Insufficient documentation

## 2024-01-16 DIAGNOSIS — H7292 Unspecified perforation of tympanic membrane, left ear: Secondary | ICD-10-CM | POA: Diagnosis not present

## 2024-01-16 DIAGNOSIS — H9202 Otalgia, left ear: Secondary | ICD-10-CM | POA: Diagnosis present

## 2024-01-16 DIAGNOSIS — H6012 Cellulitis of left external ear: Secondary | ICD-10-CM | POA: Insufficient documentation

## 2024-01-16 DIAGNOSIS — I10 Essential (primary) hypertension: Secondary | ICD-10-CM | POA: Insufficient documentation

## 2024-01-16 DIAGNOSIS — Z982 Presence of cerebrospinal fluid drainage device: Secondary | ICD-10-CM | POA: Diagnosis not present

## 2024-01-16 DIAGNOSIS — T8501XA Breakdown (mechanical) of ventricular intracranial (communicating) shunt, initial encounter: Secondary | ICD-10-CM | POA: Diagnosis not present

## 2024-01-16 MED ORDER — CEPHALEXIN 500 MG PO CAPS
500.0000 mg | ORAL_CAPSULE | Freq: Once | ORAL | Status: AC
  Administered 2024-01-16: 500 mg via ORAL
  Filled 2024-01-16: qty 1

## 2024-01-16 MED ORDER — ONDANSETRON 4 MG PO TBDP
4.0000 mg | ORAL_TABLET | Freq: Once | ORAL | Status: AC
  Administered 2024-01-16: 4 mg via ORAL
  Filled 2024-01-16: qty 1

## 2024-01-16 MED ORDER — CEPHALEXIN 500 MG PO CAPS: 500.0000 mg | ORAL_CAPSULE | Freq: Three times a day (TID) | ORAL | 0 refills | Status: AC

## 2024-01-16 MED ORDER — ONDANSETRON HCL 4 MG PO TABS: 4.0000 mg | ORAL_TABLET | Freq: Three times a day (TID) | ORAL | 0 refills | Status: DC | PRN

## 2024-01-16 NOTE — ED Provider Notes (Signed)
 [facility] Provider Note    Event Date/Time   First MD Initiated Contact with Patient 01/16/24 [phone]     (approximate)   History   Otalgia   HPI  [name] is a 53 y.o. female with history of asthma, hypertension, chronic pain syndrome, hydrocephalus with VP shunt, seizures, migraine, lupus and as listed in EMR presents to the emergency department for treatment and evaluation of intense left ear pain and drainage.  She was evaluated by her primary care provider on the 16th and had a cerumen impaction that was removed.  Ear has become increasingly painful and she now has decreased hearing. No known fever.      Physical Exam   Triage Vital Signs: ED Triage Vitals [01/16/24 0634]  Encounter Vitals Group     BP (!) 213/100     Systolic BP Percentile      Diastolic BP Percentile      Pulse Rate 96     Resp 18     Temp 97.9 F (36.6 C)     Temp Source Oral     SpO2 97 %     Weight      Height      Head Circumference      Peak Flow      Pain Score      Pain Loc      Pain Education      Exclude from Growth Chart     Most recent vital signs: Vitals:   01/16/24 0757 01/16/24 0937  BP: (!) 185/112 (!) 174/90  Pulse: 88 84  Resp: 15 16  Temp:    SpO2: 97% 96%    General: Awake, no distress.  CV:  Good peripheral perfusion.  Resp:  Normal effort.  Abd:  No distention.  Other:  Left ear is erythematous and edematous.  No EAC swelling.  Clear drainage noted.  TM perforated.   ED Results / Procedures / Treatments   Labs (all labs ordered are listed, but only abnormal results are displayed) Labs Reviewed - No data to display   EKG     RADIOLOGY  Image and radiology report reviewed and interpreted by me. Radiology report consistent with the same.  VP shunt series is normal.  PROCEDURES:  Critical Care performed: No  Procedures   MEDICATIONS ORDERED IN ED:  Medications  cephALEXin  (KEFLEX ) capsule 500 mg (500 mg  Oral Given 01/16/24 0937)  ondansetron  (ZOFRAN -ODT) disintegrating tablet 4 mg (4 mg Oral Given 01/16/24 0937)     IMPRESSION / MDM / ASSESSMENT AND PLAN / ED COURSE   I have reviewed the triage note.  Differential diagnosis includes, but is not limited to, cellulitis, otitis media, otitis externa, TM rupture, eustachian tube dysfunction  Patient's presentation is most consistent with acute presentation with potential threat to life or bodily function.  53 year old female presenting to the emergency department for treatment and evaluation of pain in the left ear.  See HPI for further details.  More concerning is patient's hypertension.  She is asymptomatic and reports that she has taken her blood pressure medication as prescribed.  She states that the blood pressure is up due to pain.  She has a history of hydrocephaly and has a VP shunt.  She denies headache, chest pain, shortness of breath, blurred vision.  Plan will be to monitor her blood pressure while here and get a VP shunt series.  On exam, her external ear is edematous and erythematous. TM  is ruptured and is draining clear fluid.  Unfortunately, she is allergic to most antibiotics.  Pharmacist consulted and it looks like she has taken cephalexin  in the past without significant adverse effects.  Patient states that she had nausea and diarrhea when she took it last.  She feels comfortable taking it as long as she can have some nausea medicine.  Also due to numerous allergies, no otic drops prescribed.  VP shunt series is reassuring.  Blood pressure has gradually decreased while here and is now 174/90.   Patient stable for discharge.  She states that she has a follow-up appointment with ENT on Friday.  She was strongly encouraged to keep that appointment.  ER return precautions also discussed.      FINAL CLINICAL IMPRESSION(S) / ED DIAGNOSES   Final diagnoses:  Ruptured tympanic membrane, left  Cellulitis of antihelix of left ear      Rx / DC Orders   ED Discharge Orders          Ordered    cephALEXin  (KEFLEX ) 500 MG capsule  3 times daily        01/16/24 0928    ondansetron  (ZOFRAN ) 4 MG tablet  Every 8 hours PRN        01/16/24 3664             Note:  This document was prepared using Dragon voice recognition software and may include unintentional dictation errors.   Sherryle Don, FNP 01/16/24 1353    Collis Deaner, MD 01/17/24 [phone]

## 2024-01-16 NOTE — ED Triage Notes (Signed)
 Patient ambulatory to triage with steady gait, without difficulty or distress noted; pt reports left ear pain; seen on 4/16 and had wax removal by PCP; redness and swelling to ear noted

## 2024-01-18 DIAGNOSIS — H60332 Swimmer's ear, left ear: Secondary | ICD-10-CM | POA: Diagnosis not present

## 2024-01-25 DIAGNOSIS — H60532 Acute contact otitis externa, left ear: Secondary | ICD-10-CM | POA: Diagnosis not present

## 2024-01-25 DIAGNOSIS — H90A21 Sensorineural hearing loss, unilateral, right ear, with restricted hearing on the contralateral side: Secondary | ICD-10-CM | POA: Diagnosis not present

## 2024-01-29 ENCOUNTER — Encounter: Payer: Self-pay | Admitting: Oncology

## 2024-02-01 ENCOUNTER — Ambulatory Visit

## 2024-02-08 ENCOUNTER — Other Ambulatory Visit: Payer: Self-pay | Admitting: Nurse Practitioner

## 2024-02-08 ENCOUNTER — Encounter: Payer: Self-pay | Admitting: Oncology

## 2024-02-08 ENCOUNTER — Ambulatory Visit: Admitting: Cardiology

## 2024-02-08 DIAGNOSIS — E538 Deficiency of other specified B group vitamins: Secondary | ICD-10-CM

## 2024-02-08 DIAGNOSIS — R0602 Shortness of breath: Secondary | ICD-10-CM

## 2024-02-08 MED ORDER — CYANOCOBALAMIN 1000 MCG/ML IJ SOLN
1000.0000 ug | Freq: Once | INTRAMUSCULAR | Status: AC
Start: 2024-02-08 — End: 2024-02-08
  Administered 2024-02-08: 1000 ug via INTRAMUSCULAR

## 2024-02-20 DIAGNOSIS — H90A21 Sensorineural hearing loss, unilateral, right ear, with restricted hearing on the contralateral side: Secondary | ICD-10-CM | POA: Diagnosis not present

## 2024-02-20 DIAGNOSIS — J3089 Other allergic rhinitis: Secondary | ICD-10-CM | POA: Diagnosis not present

## 2024-02-21 ENCOUNTER — Ambulatory Visit (INDEPENDENT_AMBULATORY_CARE_PROVIDER_SITE_OTHER): Admitting: Cardiology

## 2024-02-21 ENCOUNTER — Ambulatory Visit
Admission: RE | Admit: 2024-02-21 | Discharge: 2024-02-21 | Disposition: A | Discharge status: Home / Self Care | Source: Ambulatory Visit | Attending: Cardiology | Admitting: Cardiology

## 2024-02-21 ENCOUNTER — Encounter: Payer: Self-pay | Admitting: Cardiology

## 2024-02-21 ENCOUNTER — Ambulatory Visit: Payer: Self-pay | Admitting: Cardiology

## 2024-02-21 ENCOUNTER — Ambulatory Visit
Admission: RE | Admit: 2024-02-21 | Discharge: 2024-02-21 | Disposition: A | Discharge status: Home / Self Care | Source: Ambulatory Visit | Attending: Cardiology

## 2024-02-21 ENCOUNTER — Encounter: Payer: Self-pay | Admitting: Oncology

## 2024-02-21 VITALS — BP 118/84 | HR 75 | Ht 63.0 in | Wt 216.0 lb

## 2024-02-21 DIAGNOSIS — R1011 Right upper quadrant pain: Secondary | ICD-10-CM

## 2024-02-21 DIAGNOSIS — Z0130 Encounter for examination of blood pressure without abnormal findings: Secondary | ICD-10-CM

## 2024-02-21 NOTE — Progress Notes (Signed)
 Informed by [name], pt was also advised if she felt worse to go to the ED
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
## 2024-02-22 ENCOUNTER — Emergency Department
Admission: EM | Admit: 2024-02-22 | Discharge: 2024-02-22 | Disposition: A | Discharge status: Home / Self Care | Attending: Emergency Medicine | Admitting: Emergency Medicine

## 2024-02-22 ENCOUNTER — Emergency Department

## 2024-02-22 ENCOUNTER — Other Ambulatory Visit: Payer: Self-pay

## 2024-02-22 DIAGNOSIS — J45909 Unspecified asthma, uncomplicated: Secondary | ICD-10-CM | POA: Diagnosis not present

## 2024-02-22 DIAGNOSIS — Z982 Presence of cerebrospinal fluid drainage device: Secondary | ICD-10-CM | POA: Diagnosis not present

## 2024-02-22 DIAGNOSIS — R1011 Right upper quadrant pain: Secondary | ICD-10-CM | POA: Insufficient documentation

## 2024-02-22 DIAGNOSIS — Z90710 Acquired absence of both cervix and uterus: Secondary | ICD-10-CM | POA: Diagnosis not present

## 2024-02-22 DIAGNOSIS — R079 Chest pain, unspecified: Secondary | ICD-10-CM | POA: Diagnosis not present

## 2024-02-22 DIAGNOSIS — R0789 Other chest pain: Secondary | ICD-10-CM | POA: Diagnosis not present

## 2024-02-22 DIAGNOSIS — I10 Essential (primary) hypertension: Secondary | ICD-10-CM | POA: Insufficient documentation

## 2024-02-22 DIAGNOSIS — Z9049 Acquired absence of other specified parts of digestive tract: Secondary | ICD-10-CM | POA: Diagnosis not present

## 2024-02-22 LAB — CBC
HCT: 36.5 % (ref 36.0–46.0)
Hemoglobin: 12.8 g/dL (ref 12.0–15.0)
MCH: 30 pg (ref 26.0–34.0)
MCHC: 35.1 g/dL (ref 30.0–36.0)
MCV: 85.7 fL (ref 80.0–100.0)
Platelets: 239 10*3/uL (ref 150–400)
RBC: 4.26 MIL/uL (ref 3.87–5.11)
RDW: 13.2 % (ref 11.5–15.5)
WBC: 7.4 10*3/uL (ref 4.0–10.5)
nRBC: 0 % (ref 0.0–0.2)

## 2024-02-22 LAB — HEPATIC FUNCTION PANEL
ALT: 19 U/L (ref 0–44)
AST: 24 U/L (ref 15–41)
Albumin: 3.9 g/dL (ref 3.5–5.0)
Alkaline Phosphatase: 57 U/L (ref 38–126)
Bilirubin, Direct: 0.1 mg/dL (ref 0.0–0.2)
Indirect Bilirubin: 0.9 mg/dL (ref 0.3–0.9)
Total Bilirubin: 1 mg/dL (ref 0.0–1.2)
Total Protein: 7.4 g/dL (ref 6.5–8.1)

## 2024-02-22 LAB — TROPONIN I (HIGH SENSITIVITY)
Troponin I (High Sensitivity): 4 ng/L (ref <18)
Troponin I (High Sensitivity): 5 ng/L (ref <18)

## 2024-02-22 LAB — LIPASE, BLOOD: Lipase: 33 U/L (ref 11–51)

## 2024-02-22 LAB — BASIC METABOLIC PANEL WITH GFR
Anion gap: 11 (ref 5–15)
BUN: 9 mg/dL (ref 6–20)
CO2: 26 mmol/L (ref 22–32)
Calcium: 9.1 mg/dL (ref 8.9–10.3)
Chloride: 104 mmol/L (ref 98–111)
Creatinine, Ser: 0.88 mg/dL (ref 0.44–1.00)
GFR, Estimated: >60 mL/min (ref >60)
Glucose, Bld: 120 mg/dL — ABNORMAL HIGH (ref 70–99)
Potassium: 3.5 mmol/L (ref 3.5–5.1)
Sodium: 141 mmol/L (ref 135–145)

## 2024-02-22 MED ORDER — LIDOCAINE VISCOUS HCL 2 % MT SOLN
15.0000 mL | Freq: Once | OROMUCOSAL | Status: AC
  Administered 2024-02-22: 15 mL via OROMUCOSAL
  Filled 2024-02-22: qty 15

## 2024-02-22 MED ORDER — ALUM & MAG HYDROXIDE-SIMETH 200-200-20 MG/5ML PO SUSP
30.0000 mL | Freq: Once | ORAL | Status: AC
  Administered 2024-02-22: 30 mL via ORAL
  Filled 2024-02-22: qty 30

## 2024-02-22 MED ORDER — IOHEXOL 300 MG/ML  SOLN
100.0000 mL | Freq: Once | INTRAMUSCULAR | Status: AC | PRN
Start: 2024-02-22 — End: 2024-02-22
  Administered 2024-02-22: 100 mL via INTRAVENOUS

## 2024-02-22 MED ORDER — FENTANYL CITRATE PF 50 MCG/ML IJ SOSY
50.0000 ug | PREFILLED_SYRINGE | Freq: Once | INTRAMUSCULAR | Status: AC
  Administered 2024-02-22: 50 ug via INTRAVENOUS
  Filled 2024-02-22: qty 1

## 2024-02-22 MED ORDER — SODIUM CHLORIDE 0.9 % IV BOLUS
1000.0000 mL | Freq: Once | INTRAVENOUS | Status: AC
  Administered 2024-02-22: 1000 mL via INTRAVENOUS

## 2024-02-22 MED ORDER — PANTOPRAZOLE SODIUM 40 MG PO TBEC: 40.0000 mg | DELAYED_RELEASE_TABLET | Freq: Every day | ORAL | 0 refills | Status: DC

## 2024-02-22 MED ORDER — ONDANSETRON HCL 4 MG/2ML IJ SOLN
4.0000 mg | Freq: Once | INTRAMUSCULAR | Status: AC
Start: 2024-02-22 — End: 2024-02-22
  Administered 2024-02-22: 4 mg via INTRAVENOUS
  Filled 2024-02-22: qty 2

## 2024-02-22 MED ORDER — SENNOSIDES-DOCUSATE SODIUM 8.6-50 MG PO TABS: 1.0000 | ORAL_TABLET | Freq: Every day | ORAL | 0 refills | Status: DC

## 2024-02-22 NOTE — ED Triage Notes (Addendum)
 Pt to ED via POV from home. Pt ambulatory to triage. Pt placed in wheelchair by EDT. Pt reports yesterday started having right sided breast/upper abd pain that radiated to her back. Pt was seen at PCP. Pt reports this morning started having centralized CP and HA. Pt reports SOB, HA, N/V.   Pt has VP shunt and gallbladder removal

## 2024-02-22 NOTE — Discharge Instructions (Signed)
 I have sent medication to your pharmacy to try to help with your pain symptoms.  Please follow-up with your primary care provider for reassessment as well as your cardiologist as needed.  2 troponins which are markers for your heart were negative today with no concerns for heart attack.  CT imaging of your abdomen was also reassuring with no signs of infection or obstruction.

## 2024-02-22 NOTE — ED Provider Notes (Signed)
 [facility] Provider Note    Event Date/Time   First MD Initiated Contact with Patient 02/22/24 [phone]     (approximate)   History   Chest Pain   HPI [name] is a 53 y.o. female with history of asthma, HTN, HLD presenting today for abdominal pain.  Patient states yesterday she had onset of right upper quadrant abdominal pain and right-sided chest pain.  Felt like the pain radiated to her right shoulder.  Had some associated nausea with vomiting.  Has also been having diarrhea recently.  The pain did go away last night but returned this morning which prompted the EMS visit.  Saw her primary care provider yesterday and got an x-ray of her abdomen which was unremarkable.  Denies shortness of breath, diaphoresis, lightheadedness, dysuria, constipation.  Chart review: Patient has been seen by her PCP twice in the past 2 months for similar complaints including yesterday.  Prior history of cholecystectomy.     Physical Exam   Triage Vital Signs: ED Triage Vitals  Encounter Vitals Group     BP 02/22/24 0805 (!) 186/87     Systolic BP Percentile --      Diastolic BP Percentile --      Pulse Rate 02/22/24 0805 67     Resp 02/22/24 0805 20     Temp 02/22/24 0805 98.1 F (36.7 C)     Temp Source 02/22/24 0805 Oral     SpO2 02/22/24 0805 98 %     Weight --      Height --      Head Circumference --      Peak Flow --      Pain Score 02/22/24 0806 10     Pain Loc --      Pain Education --      Exclude from Growth Chart --     Most recent vital signs: Vitals:   02/22/24 0830 02/22/24 0900  BP: (!) 193/108 (!) 182/102  Pulse: 70 65  Resp:    Temp:    SpO2: 100% 98%   Physical Exam: I have reviewed the vital signs and nursing notes. General: Awake, alert, no acute distress.  Nontoxic appearing. Head:  Atraumatic, normocephalic.   ENT:  EOM intact, PERRL. Oral mucosa is pink and moist with no lesions. Neck: Neck is supple with full range of  motion, No meningeal signs. Cardiovascular:  RRR, No murmurs. Peripheral pulses palpable and equal bilaterally. Respiratory:  Symmetrical chest wall expansion.  No rhonchi, rales, or wheezes.  Good air movement throughout.  No use of accessory muscles.   Musculoskeletal:  No cyanosis or edema. Moving extremities with full ROM Abdomen:  Soft, noticeable tenderness to palpation of the right upper quadrant with mild palpation epigastric region, nondistended. Neuro:  GCS 15, moving all four extremities, interacting appropriately. Speech clear. Psych:  Calm, appropriate.   Skin:  Warm, dry, no rash.    ED Results / Procedures / Treatments   Labs (all labs ordered are listed, but only abnormal results are displayed) Labs Reviewed  BASIC METABOLIC PANEL WITH GFR - Abnormal; Notable for the following components:      Result Value   Glucose, Bld 120 (*)    All other components within normal limits  CBC  HEPATIC FUNCTION PANEL  LIPASE, BLOOD  TROPONIN I (HIGH SENSITIVITY)  TROPONIN I (HIGH SENSITIVITY)     EKG My EKG interpretation: Rate of 61, normal sinus rhythm, normal axis, normal intervals.  T wave inversions present in V3, V4, V5.  No other acute ST elevations or depressions.  This is consistent with her most recent EKG on 04/23/2023   RADIOLOGY Independently interpreted chest x-ray with no acute pathology.  Independently interpreted CT abdomen/pelvis with no acute pathology   PROCEDURES:  Critical Care performed: No  Procedures   MEDICATIONS ORDERED IN ED: Medications  sodium chloride  0.9 % bolus 1,000 mL (0 mLs Intravenous Stopped 02/22/24 1018)  ondansetron  (ZOFRAN ) injection 4 mg (4 mg Intravenous Given 02/22/24 0840)  fentaNYL  (SUBLIMAZE ) injection 50 mcg (50 mcg Intravenous Given 02/22/24 0840)  fentaNYL  (SUBLIMAZE ) injection 50 mcg (50 mcg Intravenous Given 02/22/24 0912)  iohexol  (OMNIPAQUE ) 300 MG/ML solution 100 mL (100 mLs Intravenous Contrast Given 02/22/24 0925)  alum &  mag hydroxide-simeth (MAALOX/MYLANTA) 200-200-20 MG/5ML suspension 30 mL (30 mLs Oral Given 02/22/24 1016)  lidocaine  (XYLOCAINE ) 2 % viscous mouth solution 15 mL (15 mLs Mouth/Throat Given 02/22/24 1016)     IMPRESSION / MDM / ASSESSMENT AND PLAN / ED COURSE  I reviewed the triage vital signs and the nursing notes.                              Differential diagnosis includes, but is not limited to, enteritis, colitis, pancreatitis, SBO, diverticulitis, constipation, lower suspicion for pneumonia or ACS  Patient's presentation is most consistent with acute complicated illness / injury requiring diagnostic workup.  Patient is a 53 year old female presenting today for recurrent episodes of right upper abdominal pain as well as right-sided chest pain.  On her exam, she has notable tenderness to the right side of her abdomen as well as some trace tenderness along her right chest wall.  No left-sided chest pain.  Has had associated nausea with vomiting yesterday.  Exam elsewise reassuring.  Vital signs stable.  EKG consistent with her baseline with no new ischemic findings.  CBC and BMP unremarkable.  Lipase and hepatic function panel normal.  Troponins negative x 2 with no concern for ACS as this would be extremely atypical with tenderness palpation her abdomen.  CT abdomen/pelvis shows evidence of stool along her right colon but no other acute pathology.  She was also given a GI cocktail with resolution of her symptoms.  At this time, I suspect her symptoms are more related to possible constipation and reflux and will start on Peri-Colace and Protonix .  Told to follow-up with PCP and given strict return precautions.  The patient is on the cardiac monitor to evaluate for evidence of arrhythmia and/or significant heart rate changes. Clinical Course as of 02/22/24 1139  Fri Feb 22, 2024  1136 Resolution of pain symptoms with GI cocktail.  Will discharge at this time [DW]    Clinical Course User  Index [DW] Kandee Orion, MD     FINAL CLINICAL IMPRESSION(S) / ED DIAGNOSES   Final diagnoses:  Right upper quadrant abdominal pain     Rx / DC Orders   ED Discharge Orders          Ordered    pantoprazole  (PROTONIX ) 40 MG tablet  Daily        02/22/24 1138    senna-docusate (SENOKOT-S) 8.6-50 MG tablet  Daily        02/22/24 1138             Note:  This document was prepared using Dragon voice recognition software and may include unintentional dictation errors.   Karlynn Oyster,  Achilles Holes, MD 02/22/24 [phone]

## 2024-02-25 ENCOUNTER — Other Ambulatory Visit

## 2024-02-26 ENCOUNTER — Telehealth: Payer: Self-pay | Admitting: Cardiology

## 2024-02-26 NOTE — Telephone Encounter (Signed)
 Called the patient and she was reviewing her EKG on MyChart and she saw that her EKG from 02/22/24 when she was in the ER stated that it was an abnormal EKG. She would like to know why it was abnormal and what can be done to fix it?

## 2024-02-26 NOTE — Telephone Encounter (Signed)
 Pt would like to go over EKG results. Please advise

## 2024-02-28 NOTE — Telephone Encounter (Signed)
 Called the patient and informed her of  Dr. Madireddy's recommendation below:  Set her up for follow-up visit.  We can review at the office visit.  Thank you  Patient verbalized understanding and an appointment was set - up for 03/06/24 at 8:20 am. Patient had no further questions at this time.

## 2024-03-06 ENCOUNTER — Ambulatory Visit

## 2024-03-06 ENCOUNTER — Other Ambulatory Visit: Payer: Self-pay

## 2024-03-06 VITALS — BP 126/86 | HR 70 | Ht 63.0 in | Wt 216.8 lb

## 2024-03-06 DIAGNOSIS — R079 Chest pain, unspecified: Secondary | ICD-10-CM | POA: Diagnosis not present

## 2024-03-06 DIAGNOSIS — J4540 Moderate persistent asthma, uncomplicated: Secondary | ICD-10-CM

## 2024-03-06 DIAGNOSIS — I10 Essential (primary) hypertension: Secondary | ICD-10-CM

## 2024-03-06 DIAGNOSIS — R072 Precordial pain: Secondary | ICD-10-CM

## 2024-03-06 MED ORDER — METOPROLOL TARTRATE 100 MG PO TABS: 100.0000 mg | ORAL_TABLET | Freq: Once | ORAL | 0 refills | Status: DC

## 2024-03-06 NOTE — Assessment & Plan Note (Signed)
 Well-controlled. Continue current medication losartan -hydrochlorothiazide  combination 50 mg - 12.5 mg once daily. Target below 130/80 mmHg.

## 2024-03-06 NOTE — Addendum Note (Signed)
 Addended by: Aurelio Leer I on: 03/06/2024 09:54 AM   Modules accepted: Orders
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
## 2024-03-06 NOTE — Patient Instructions (Signed)
 Medication Instructions:  Your physician recommends that you continue on your current medications as directed. Please refer to the Current Medication list given to you today.  *If you need a refill on your cardiac medications before your next appointment, please call your pharmacy*  Lab Work: Your physician recommends that you return for lab work in:   Labs today: BMP  If you have labs (blood work) drawn today and your tests are completely normal, you will receive your results only by: MyChart Message (if you have MyChart) OR A paper copy in the mail If you have any lab test that is abnormal or we need to change your treatment, we will call you to review the results.  Testing/Procedures: Your physician has requested that you have an echocardiogram. Echocardiography is a painless test that uses sound waves to create images of your heart. It provides your doctor with information about the size and shape of your heart and how well your heart's chambers and valves are working. This procedure takes approximately one hour. There are no restrictions for this procedure. Please do NOT wear cologne, perfume, aftershave, or lotions (deodorant is allowed). Please arrive 15 minutes prior to your appointment time.  Please note: We ask at that you not bring children with you during ultrasound (echo/ vascular) testing. Due to room size and safety concerns, children are not allowed in the ultrasound rooms during exams. Our front office staff cannot provide observation of children in our lobby area while testing is being conducted. An adult accompanying a patient to their appointment will only be allowed in the ultrasound room at the discretion of the ultrasound technician under special circumstances. We apologize for any inconvenience.    Your cardiac CT will be scheduled at one of the below locations:   [facility] [address] [place], [place] [address] [phone]  OR  [facility] [address] [place], [place] [address] [phone]  OR   [facility] [address] [place], [place] [address] [phone]  OR   [facility] [address] [place], [place] [address] [phone]  OR   [facility] [address]  [place], [place] [address]  If scheduled at [facility], please arrive at the [facility] and Children's Entrance (Entrance C2) of [facility] 30 minutes prior to test start time. You can use the FREE valet parking offered at entrance C (encouraged to control the heart rate for the test)  Proceed to the [facility] Radiology Department (first floor) to check-in and test prep.   All radiology patients and guests should use entrance C2 at [facility], accessed from [facility], even though the hospital's physical address listed is [address]    If scheduled at the Heart and Vascular Tower at [address], please enter the parking lot using the [address] entrance and use the FREE valet service at the patient drop-off area. Enter the buidling and check-in with registration on the main floor.  If scheduled at [facility] or [facility], please arrive 15 mins early for check-in and test prep.  There is spacious parking and easy access to the radiology department from the [facility] Heart and Vascular entrance. Please enter here and check-in with the desk attendant.   If scheduled at [facility], please arrive 30 minutes early for check-in and test prep.  Please follow these instructions carefully (unless otherwise directed):  An IV will be required for this test and Nitroglycerin  will be given.  Hold all erectile dysfunction medications at least 3 days (72 hrs) prior to test. (Ie viagra, cialis, sildenafil, tadalafil, etc)   On the  Night Before the Test: Be sure to Drink plenty of water. Do not consume any caffeinated/decaffeinated beverages or chocolate 12 hours prior to your test. Do not take any antihistamines 12 hours prior to your test.  On the Day of the Test: Drink plenty of water until 1 hour prior to the test. Do not eat any food 1 hour prior to test. You may take your regular medications prior to the test.  Take metoprolol  (Lopressor ) two hours prior to test. Patients who wear a continuous glucose monitor MUST remove the device prior to scanning. FEMALES- please wear underwire-free bra if available, avoid dresses & tight clothing        After the Test: Drink plenty of water. After receiving IV contrast, you may experience a mild flushed feeling. This is normal. On occasion, you may experience a mild rash up to 24 hours after the test. This is not dangerous. If this occurs, you can take Benadryl  25 mg, Zyrtec, Claritin, or Allegra and increase your fluid intake. (Patients taking Tikosyn should avoid Benadryl , and may take Zyrtec, Claritin, or Allegra) If you experience trouble breathing, this can be serious. If it is severe call 911 IMMEDIATELY. If it is mild, please call our office.  We will call to schedule your test 2-4 weeks out understanding that some insurance companies will need an authorization prior to the service being performed.   For more information and frequently asked questions, please visit our website : [email or web address]  For non-scheduling related questions, please contact the cardiac imaging nurse navigator should you have any questions/concerns: Cardiac Imaging Nurse Navigators Direct Office Dial: [phone]   For scheduling needs, including cancellations and rescheduling, please call [place], [phone].   Follow-Up: At [facility], you and your health needs are our priority.  As part of our continuing mission to provide you with exceptional heart  care, our providers are all part of one team.  This team includes your primary Cardiologist (physician) and Advanced Practice Providers or APPs (Physician Assistants and Nurse Practitioners) who all work together to provide you with the care you need, when you need it.  Your next appointment:   2 month(s)  Provider:   Bertha Broad, MD    We recommend signing up for the patient portal called MyChart.  Sign up information is provided on this After Visit Summary.  MyChart is used to connect with patients for Virtual Visits (Telemedicine).  Patients are able to view lab/test results, encounter notes, upcoming appointments, etc.  Non-urgent messages can be sent to your provider as well.   To learn more about what you can do with MyChart, go to [email or web address].   Other Instructions None

## 2024-03-06 NOTE — Assessment & Plan Note (Signed)
 Anginal equivalent. No significant coronary artery disease on prior angiogram from August 2019.  Did report elevated LVEDP 20 to 25 mmHg at the time.  With progressive symptoms, will further evaluate for any obstructive coronary artery disease with repeat cardiac CT coronary angiogram either in [place] or at heart care in [place].  Will also obtain transthoracic echocardiogram to assess cardiac structure and function and diastolic function and RVSP.  Advised her to cut down on salt intake.  Any significant symptoms or increasing chest pain with exertion or shortness of breath proceed to the ER.

## 2024-03-07 LAB — BASIC METABOLIC PANEL WITH GFR
BUN/Creatinine Ratio: 10 (ref 9–23)
BUN: 10 mg/dL (ref 6–24)
CO2: 20 mmol/L (ref 20–29)
Calcium: 9.5 mg/dL (ref 8.7–10.2)
Chloride: 105 mmol/L (ref 96–106)
Creatinine, Ser: 1.02 mg/dL — ABNORMAL HIGH (ref 0.57–1.00)
Glucose: 91 mg/dL (ref 70–99)
Potassium: 3.7 mmol/L (ref 3.5–5.2)
Sodium: 140 mmol/L (ref 134–144)
eGFR: 65 mL/min/{1.73_m2} (ref >59)

## 2024-03-10 ENCOUNTER — Ambulatory Visit

## 2024-03-12 ENCOUNTER — Encounter: Payer: Self-pay | Admitting: Cardiology

## 2024-03-12 ENCOUNTER — Ambulatory Visit

## 2024-03-12 ENCOUNTER — Encounter: Payer: Self-pay | Admitting: Oncology

## 2024-03-12 ENCOUNTER — Ambulatory Visit (INDEPENDENT_AMBULATORY_CARE_PROVIDER_SITE_OTHER): Admitting: Cardiology

## 2024-03-12 VITALS — BP 112/78 | HR 75 | Ht 63.0 in | Wt 216.0 lb

## 2024-03-12 DIAGNOSIS — E538 Deficiency of other specified B group vitamins: Secondary | ICD-10-CM

## 2024-03-12 DIAGNOSIS — J069 Acute upper respiratory infection, unspecified: Secondary | ICD-10-CM | POA: Insufficient documentation

## 2024-03-12 DIAGNOSIS — R2689 Other abnormalities of gait and mobility: Secondary | ICD-10-CM | POA: Diagnosis not present

## 2024-03-12 DIAGNOSIS — G43719 Chronic migraine without aura, intractable, without status migrainosus: Secondary | ICD-10-CM | POA: Diagnosis not present

## 2024-03-12 DIAGNOSIS — Z0130 Encounter for examination of blood pressure without abnormal findings: Secondary | ICD-10-CM

## 2024-03-12 DIAGNOSIS — Q031 Atresia of foramina of Magendie and Luschka: Secondary | ICD-10-CM | POA: Diagnosis not present

## 2024-03-12 DIAGNOSIS — M7918 Myalgia, other site: Secondary | ICD-10-CM | POA: Diagnosis not present

## 2024-03-12 MED ORDER — CYANOCOBALAMIN 1000 MCG/ML IJ SOLN
1000.0000 ug | Freq: Once | INTRAMUSCULAR | Status: AC
  Administered 2024-03-12: 1000 ug via INTRAMUSCULAR

## 2024-03-12 MED ORDER — AZITHROMYCIN 250 MG PO TABS
ORAL_TABLET | ORAL | 0 refills | Status: AC
Start: 2024-03-12 — End: 2024-03-17
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
## 2024-03-12 NOTE — Addendum Note (Signed)
 Addended by: ALMER NAKAI on: 03/12/2024 11:52 AM   Modules accepted: Orders

## 2024-03-18 ENCOUNTER — Encounter: Payer: Self-pay | Admitting: Oncology

## 2024-03-19 ENCOUNTER — Telehealth (HOSPITAL_COMMUNITY): Payer: Self-pay | Admitting: Emergency Medicine

## 2024-03-19 NOTE — Telephone Encounter (Signed)
 Reaching out to patient to offer assistance regarding upcoming cardiac imaging study; pt verbalizes understanding of appt date/time, parking situation and where to check in, pre-test NPO status and medications ordered, and verified current allergies; name and call back number provided for further questions should they arise Rockwell Alexandria RN Navigator Cardiac Imaging [facility] [phone] office [phone] cell

## 2024-03-20 ENCOUNTER — Ambulatory Visit
Admission: RE | Admit: 2024-03-20 | Discharge: 2024-03-20 | Disposition: A | Discharge status: Home / Self Care | Source: Ambulatory Visit

## 2024-03-20 ENCOUNTER — Ambulatory Visit

## 2024-03-20 ENCOUNTER — Ambulatory Visit: Payer: Self-pay

## 2024-03-20 DIAGNOSIS — R079 Chest pain, unspecified: Secondary | ICD-10-CM

## 2024-03-20 DIAGNOSIS — R072 Precordial pain: Secondary | ICD-10-CM | POA: Diagnosis not present

## 2024-03-20 DIAGNOSIS — I10 Essential (primary) hypertension: Secondary | ICD-10-CM | POA: Diagnosis not present

## 2024-03-20 LAB — ECHOCARDIOGRAM COMPLETE
AR max vel: 3.15 cm2
AV Area VTI: 3 cm2
AV Area mean vel: 2.9 cm2
AV Mean grad: 4 mmHg
AV Peak grad: 8 mmHg
Ao pk vel: 1.41 m/s
Area-P 1/2: 3.85 cm2
Calc EF: 67 %
S' Lateral: 3.4 cm
Single Plane A2C EF: 75.8 %
Single Plane A4C EF: 61.9 %

## 2024-03-20 MED ORDER — NITROGLYCERIN 0.4 MG SL SUBL
0.8000 mg | SUBLINGUAL_TABLET | Freq: Once | SUBLINGUAL | Status: AC
  Administered 2024-03-20: 0.8 mg via SUBLINGUAL

## 2024-03-20 MED ORDER — IOHEXOL 350 MG/ML SOLN
80.0000 mL | Freq: Once | INTRAVENOUS | Status: AC | PRN
  Administered 2024-03-20: 80 mL via INTRAVENOUS

## 2024-03-20 NOTE — Progress Notes (Signed)
 Patient tolerated CT well. Vital signs stable encourage to drink water throughout day.Reasons explained and verbalized understanding. Ambulated steady gait.

## 2024-03-20 NOTE — Progress Notes (Incomplete)
Patient tolerated

## 2024-03-28 ENCOUNTER — Other Ambulatory Visit: Payer: Self-pay | Admitting: Cardiology

## 2024-03-29 ENCOUNTER — Other Ambulatory Visit: Payer: Self-pay | Admitting: Cardiology

## 2024-03-29 ENCOUNTER — Other Ambulatory Visit: Payer: Self-pay | Admitting: Gastroenterology

## 2024-04-01 ENCOUNTER — Encounter: Payer: Self-pay | Admitting: Oncology

## 2024-04-03 DIAGNOSIS — G918 Other hydrocephalus: Secondary | ICD-10-CM | POA: Diagnosis not present

## 2024-04-03 DIAGNOSIS — E785 Hyperlipidemia, unspecified: Secondary | ICD-10-CM | POA: Diagnosis not present

## 2024-04-03 DIAGNOSIS — G43909 Migraine, unspecified, not intractable, without status migrainosus: Secondary | ICD-10-CM | POA: Diagnosis not present

## 2024-04-03 DIAGNOSIS — K219 Gastro-esophageal reflux disease without esophagitis: Secondary | ICD-10-CM | POA: Diagnosis not present

## 2024-04-03 DIAGNOSIS — J45998 Other asthma: Secondary | ICD-10-CM | POA: Diagnosis not present

## 2024-04-03 DIAGNOSIS — N1831 Chronic kidney disease, stage 3a: Secondary | ICD-10-CM | POA: Diagnosis not present

## 2024-04-09 ENCOUNTER — Ambulatory Visit: Payer: PPO | Admitting: Internal Medicine

## 2024-04-09 DIAGNOSIS — R0602 Shortness of breath: Secondary | ICD-10-CM

## 2024-04-09 DIAGNOSIS — J4530 Mild persistent asthma, uncomplicated: Secondary | ICD-10-CM | POA: Diagnosis not present

## 2024-04-16 ENCOUNTER — Encounter: Payer: Self-pay | Admitting: Cardiology

## 2024-04-16 ENCOUNTER — Ambulatory Visit (INDEPENDENT_AMBULATORY_CARE_PROVIDER_SITE_OTHER): Payer: Self-pay | Admitting: Cardiology

## 2024-04-16 ENCOUNTER — Encounter: Payer: Self-pay | Admitting: Oncology

## 2024-04-16 VITALS — BP 112/81 | HR 57 | Ht 63.0 in | Wt 214.0 lb

## 2024-04-16 DIAGNOSIS — I10 Essential (primary) hypertension: Secondary | ICD-10-CM | POA: Diagnosis not present

## 2024-04-16 DIAGNOSIS — E538 Deficiency of other specified B group vitamins: Secondary | ICD-10-CM

## 2024-04-16 DIAGNOSIS — E782 Mixed hyperlipidemia: Secondary | ICD-10-CM

## 2024-04-16 DIAGNOSIS — R079 Chest pain, unspecified: Secondary | ICD-10-CM | POA: Diagnosis not present

## 2024-04-16 DIAGNOSIS — Z1329 Encounter for screening for other suspected endocrine disorder: Secondary | ICD-10-CM

## 2024-04-16 DIAGNOSIS — R0602 Shortness of breath: Secondary | ICD-10-CM

## 2024-04-16 DIAGNOSIS — Z131 Encounter for screening for diabetes mellitus: Secondary | ICD-10-CM

## 2024-04-16 MED ORDER — VALACYCLOVIR HCL 500 MG PO TABS: 500.0000 mg | ORAL_TABLET | Freq: Every day | ORAL | 4 refills | Status: AC

## 2024-04-16 MED ORDER — ROSUVASTATIN CALCIUM 40 MG PO TABS: 40.0000 mg | ORAL_TABLET | Freq: Every day | ORAL | 1 refills | Status: DC

## 2024-04-16 MED ORDER — PANTOPRAZOLE SODIUM 40 MG PO TBEC: 40.0000 mg | DELAYED_RELEASE_TABLET | Freq: Every day | ORAL | 0 refills | Status: DC

## 2024-04-16 MED ORDER — CYANOCOBALAMIN 1000 MCG/ML IJ SOLN
1000.0000 ug | Freq: Once | INTRAMUSCULAR | Status: AC
  Administered 2024-04-16: 1000 ug via INTRAMUSCULAR

## 2024-04-16 MED ORDER — CITALOPRAM HYDROBROMIDE 40 MG PO TABS: 40.0000 mg | ORAL_TABLET | Freq: Every day | ORAL | 1 refills | Status: AC

## 2024-04-16 MED ORDER — ALBUTEROL SULFATE HFA 108 (90 BASE) MCG/ACT IN AERS: 2.0000 | INHALATION_SPRAY | Freq: Four times a day (QID) | RESPIRATORY_TRACT | 2 refills | Status: AC | PRN
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
## 2024-04-17 ENCOUNTER — Ambulatory Visit: Payer: Self-pay | Admitting: Cardiology

## 2024-04-17 ENCOUNTER — Other Ambulatory Visit: Payer: Self-pay | Admitting: Cardiology

## 2024-04-17 LAB — CMP14+EGFR
ALT: 24 IU/L (ref 0–32)
AST: 22 IU/L (ref 0–40)
Albumin: 4.5 g/dL (ref 3.8–4.9)
Alkaline Phosphatase: 69 IU/L (ref 44–121)
BUN/Creatinine Ratio: 9 (ref 9–23)
BUN: 9 mg/dL (ref 6–24)
Bilirubin Total: 0.6 mg/dL (ref 0.0–1.2)
CO2: 20 mmol/L (ref 20–29)
Calcium: 9.5 mg/dL (ref 8.7–10.2)
Chloride: 108 mmol/L — ABNORMAL HIGH (ref 96–106)
Creatinine, Ser: 1.02 mg/dL — ABNORMAL HIGH (ref 0.57–1.00)
Globulin, Total: 2.5 g/dL (ref 1.5–4.5)
Glucose: 98 mg/dL (ref 70–99)
Potassium: 3.9 mmol/L (ref 3.5–5.2)
Sodium: 142 mmol/L (ref 134–144)
Total Protein: 7 g/dL (ref 6.0–8.5)
eGFR: 65 mL/min/1.73 (ref >59)

## 2024-04-17 LAB — LIPID PANEL
Chol/HDL Ratio: 3.1 ratio (ref 0.0–4.4)
Cholesterol, Total: 143 mg/dL (ref 100–199)
HDL: 46 mg/dL (ref >39)
LDL Chol Calc (NIH): 66 mg/dL (ref 0–99)
Triglycerides: 183 mg/dL — ABNORMAL HIGH (ref 0–149)
VLDL Cholesterol Cal: 31 mg/dL (ref 5–40)

## 2024-04-17 LAB — VITAMIN B12: Vitamin B-12: >2000 pg/mL — ABNORMAL HIGH (ref 232–1245)

## 2024-04-17 LAB — TSH: TSH: 2.49 u[IU]/mL (ref 0.450–4.500)

## 2024-04-17 LAB — HEMOGLOBIN A1C
Est. average glucose Bld gHb Est-mCnc: 100 mg/dL
Hgb A1c MFr Bld: 5.1 % (ref 4.8–5.6)

## 2024-04-17 MED ORDER — WEGOVY 0.25 MG/0.5ML ~~LOC~~ SOAJ
0.2500 mg | SUBCUTANEOUS | 3 refills | Status: DC
Start: 2024-04-17 — End: 2024-06-26

## 2024-04-17 NOTE — Progress Notes (Signed)
Pt informed

## 2024-04-18 ENCOUNTER — Encounter: Payer: Self-pay | Admitting: Cardiology

## 2024-04-21 ENCOUNTER — Encounter: Payer: Self-pay | Admitting: Internal Medicine

## 2024-04-21 ENCOUNTER — Ambulatory Visit: Admitting: Internal Medicine

## 2024-04-21 ENCOUNTER — Other Ambulatory Visit: Payer: Self-pay | Admitting: Internal Medicine

## 2024-04-21 VITALS — BP 118/79 | HR 80 | Temp 97.8°F | Resp 16 | Ht 63.0 in | Wt 215.0 lb

## 2024-04-21 DIAGNOSIS — J4540 Moderate persistent asthma, uncomplicated: Secondary | ICD-10-CM | POA: Diagnosis not present

## 2024-04-21 DIAGNOSIS — K219 Gastro-esophageal reflux disease without esophagitis: Secondary | ICD-10-CM | POA: Diagnosis not present

## 2024-04-21 DIAGNOSIS — J208 Acute bronchitis due to other specified organisms: Secondary | ICD-10-CM

## 2024-04-21 MED ORDER — PREDNISONE 10 MG (21) PO TBPK: ORAL_TABLET | ORAL | 0 refills | Status: DC

## 2024-04-21 MED ORDER — AZITHROMYCIN 250 MG PO TABS: ORAL_TABLET | ORAL | 0 refills | Status: AC
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
## 2024-04-21 NOTE — Patient Instructions (Signed)
 Asthma, Adult  Asthma is a condition that causes swelling and narrowing of the airways. These are the passages that lead from the nose and mouth down into the lungs. When asthma symptoms get worse it is called an asthma attack or flare. This can make it hard to breathe. Asthma flares can range from minor to life-threatening. There is no cure for asthma, but medicines and lifestyle changes can help to control it. What are the causes? It is not known exactly what causes asthma, but certain things can cause asthma symptoms to get worse (triggers). What can trigger an asthma attack? Cigarette smoke. Mold. Dust. Your pet's skin flakes (dander). Cockroaches. Pollen. Air pollution (like household cleaners, wood smoke, smog, or Therapist, occupational). What are the signs or symptoms? Trouble breathing (shortness of breath). Coughing. Making high-pitched whistling sounds when you breathe, most often when you breathe out (wheezing). Chest tightness. Tiredness with little activity. Poor exercise tolerance. How is this treated? Controller medicines that help prevent asthma symptoms. Fast-acting reliever or rescue medicines. These give short-term relief of asthma symptoms. Allergy medicines if your attacks are brought on by allergens. Medicines to help control the body's defense (immune) system. Staying away from the things that cause asthma attacks. Follow these instructions at home: Avoiding triggers in your home Do not allow anyone to smoke in your home. Limit use of fireplaces and wood stoves. Get rid of pests (such as roaches and mice) and their droppings. Keep your home clean. Clean your floors. Dust regularly. Use cleaning products that do not smell. Wash bed sheets and blankets every week in hot water. Dry them in a dryer. Have someone vacuum when you are not home. Change your heating and air conditioning filters often. Use blankets that are made of polyester or cotton. General  instructions Take over-the-counter and prescription medicines only as told by your doctor. Do not smoke or use any products that contain nicotine or tobacco. If you need help quitting, ask your doctor. Stay away from secondhand smoke. Avoid doing things outdoors when allergen counts are high and when air quality is low. Warm up before you exercise. Take time to cool down after exercise. Use a peak flow meter as told by your doctor. A peak flow meter is a tool that measures how well your lungs are working. Keep track of the peak flow meter's readings. Write them down. Follow your asthma action plan. This is a written plan for taking care of your asthma and treating your attacks. Make sure you get all the shots (vaccines) that your doctor recommends. Ask your doctor about a flu shot and a pneumonia shot. Keep all follow-up visits. Contact a doctor if: You have wheezing, shortness of breath, or a cough even while taking medicine to prevent attacks. The mucus you cough up (sputum) is thicker than usual. The mucus you cough up changes from clear or white to yellow, green, gray, or is bloody. You have problems from the medicine you are taking, such as: A rash. Itching. Swelling. Trouble breathing. You need reliever medicines more than 2-3 times a week. Your peak flow reading is still at 50-79% of your personal best after following the action plan for 1 hour. You have a fever. Get help right away if: You seem to be worse and are not responding to medicine during an asthma attack. You are short of breath even at rest. You get short of breath when doing very little activity. You have trouble eating, drinking, or talking. You have chest  pain or tightness. You have a fast heartbeat. Your lips or fingernails start to turn blue. You are light-headed or dizzy, or you faint. Your peak flow is less than 50% of your personal best. You feel too tired to breathe normally. These symptoms may be an  emergency. Get help right away. Call 911. Do not wait to see if the symptoms will go away. Do not drive yourself to the hospital. Summary Asthma is a long-term (chronic) condition in which the airways get tight and narrow. An asthma attack can make it hard to breathe. Asthma cannot be cured, but medicines and lifestyle changes can help control it. Make sure you understand how to avoid triggers and how and when to use your medicines. Avoid things that can cause allergy symptoms (allergens). These include animal skin flakes (dander) and pollen from trees or grass. Avoid things that pollute the air. These may include household cleaners, wood smoke, smog, or chemical odors. This information is not intended to replace advice given to you by your health care provider. Make sure you discuss any questions you have with your health care provider. Document Revised: 06/13/2021 Document Reviewed: 06/13/2021 Elsevier Patient Education  2024 [employer].

## 2024-04-22 ENCOUNTER — Telehealth: Payer: Self-pay

## 2024-04-22 NOTE — Telephone Encounter (Addendum)
 Pt called her room mate had Covid and she is having symptoms advised as per dsk to call her PCP  or go to ED

## 2024-04-23 ENCOUNTER — Other Ambulatory Visit: Payer: Self-pay | Admitting: Cardiology

## 2024-04-23 ENCOUNTER — Encounter: Payer: Self-pay | Admitting: Cardiology

## 2024-04-23 ENCOUNTER — Telehealth: Payer: Self-pay

## 2024-04-23 MED ORDER — PAXLOVID (300/100) 20 X 150 MG & 10 X 100MG PO TBPK: 3.0000 | ORAL_TABLET | Freq: Two times a day (BID) | ORAL | 0 refills | Status: DC

## 2024-04-23 NOTE — Telephone Encounter (Signed)
 Error

## 2024-04-23 NOTE — Telephone Encounter (Signed)
 Pt called and left vm stating that she did an at home covid test & it came back positive, she asked if you can send rx? Please advise

## 2024-04-23 NOTE — Telephone Encounter (Signed)
 Pt called in to switch to a provider in [place] office. Is this switch okay with you all?

## 2024-04-28 ENCOUNTER — Emergency Department
Admission: EM | Admit: 2024-04-28 | Discharge: 2024-04-28 | Disposition: A | Discharge status: Home / Self Care | Attending: Emergency Medicine | Admitting: Emergency Medicine

## 2024-04-28 ENCOUNTER — Other Ambulatory Visit: Payer: Self-pay

## 2024-04-28 ENCOUNTER — Emergency Department

## 2024-04-28 DIAGNOSIS — Z982 Presence of cerebrospinal fluid drainage device: Secondary | ICD-10-CM | POA: Diagnosis not present

## 2024-04-28 DIAGNOSIS — E876 Hypokalemia: Secondary | ICD-10-CM | POA: Diagnosis not present

## 2024-04-28 DIAGNOSIS — R059 Cough, unspecified: Secondary | ICD-10-CM | POA: Diagnosis not present

## 2024-04-28 DIAGNOSIS — G919 Hydrocephalus, unspecified: Secondary | ICD-10-CM | POA: Insufficient documentation

## 2024-04-28 DIAGNOSIS — R7303 Prediabetes: Secondary | ICD-10-CM | POA: Insufficient documentation

## 2024-04-28 DIAGNOSIS — U071 COVID-19: Secondary | ICD-10-CM | POA: Diagnosis not present

## 2024-04-28 DIAGNOSIS — J189 Pneumonia, unspecified organism: Secondary | ICD-10-CM | POA: Diagnosis present

## 2024-04-28 DIAGNOSIS — I700 Atherosclerosis of aorta: Secondary | ICD-10-CM | POA: Insufficient documentation

## 2024-04-28 DIAGNOSIS — R918 Other nonspecific abnormal finding of lung field: Secondary | ICD-10-CM | POA: Diagnosis not present

## 2024-04-28 DIAGNOSIS — J45909 Unspecified asthma, uncomplicated: Secondary | ICD-10-CM | POA: Diagnosis not present

## 2024-04-28 LAB — CBC
HCT: 38.6 % (ref 36.0–46.0)
Hemoglobin: 13.3 g/dL (ref 12.0–15.0)
MCH: 30.2 pg (ref 26.0–34.0)
MCHC: 34.5 g/dL (ref 30.0–36.0)
MCV: 87.7 fL (ref 80.0–100.0)
Platelets: 285 K/uL (ref 150–400)
RBC: 4.4 MIL/uL (ref 3.87–5.11)
RDW: 13.8 % (ref 11.5–15.5)
WBC: 9 K/uL (ref 4.0–10.5)
nRBC: 0 % (ref 0.0–0.2)

## 2024-04-28 LAB — BASIC METABOLIC PANEL WITH GFR
Anion gap: 7 (ref 5–15)
BUN: 8 mg/dL (ref 6–20)
CO2: 25 mmol/L (ref 22–32)
Calcium: 9 mg/dL (ref 8.9–10.3)
Chloride: 108 mmol/L (ref 98–111)
Creatinine, Ser: 1.01 mg/dL — ABNORMAL HIGH (ref 0.44–1.00)
GFR, Estimated: >60 mL/min (ref >60)
Glucose, Bld: 97 mg/dL (ref 70–99)
Potassium: 3 mmol/L — ABNORMAL LOW (ref 3.5–5.1)
Sodium: 140 mmol/L (ref 135–145)

## 2024-04-28 LAB — HEPATIC FUNCTION PANEL
ALT: 25 U/L (ref 0–44)
AST: 22 U/L (ref 15–41)
Albumin: 3.9 g/dL (ref 3.5–5.0)
Alkaline Phosphatase: 41 U/L (ref 38–126)
Bilirubin, Direct: 0.1 mg/dL (ref 0.0–0.2)
Indirect Bilirubin: 0.6 mg/dL (ref 0.3–0.9)
Total Bilirubin: 0.7 mg/dL (ref 0.0–1.2)
Total Protein: 7.5 g/dL (ref 6.5–8.1)

## 2024-04-28 LAB — MAGNESIUM: Magnesium: 2 mg/dL (ref 1.7–2.4)

## 2024-04-28 MED ORDER — METOCLOPRAMIDE HCL 10 MG PO TABS: 10.0000 mg | ORAL_TABLET | Freq: Three times a day (TID) | ORAL | 1 refills | Status: AC

## 2024-04-28 MED ORDER — METOCLOPRAMIDE HCL 5 MG/ML IJ SOLN
10.0000 mg | Freq: Once | INTRAMUSCULAR | Status: AC
  Administered 2024-04-28 (×2): 10 mg via INTRAVENOUS
  Filled 2024-04-28: qty 2

## 2024-04-28 MED ORDER — LACTATED RINGERS IV BOLUS
1000.0000 mL | Freq: Once | INTRAVENOUS | Status: AC
  Administered 2024-04-28 (×2): 1000 mL via INTRAVENOUS

## 2024-04-28 MED ORDER — POTASSIUM CHLORIDE CRYS ER 20 MEQ PO TBCR
40.0000 meq | EXTENDED_RELEASE_TABLET | Freq: Once | ORAL | Status: AC
  Administered 2024-04-28 (×2): 40 meq via ORAL
  Filled 2024-04-28: qty 2

## 2024-04-28 MED ORDER — PANTOPRAZOLE SODIUM 40 MG PO TBEC: 40.0000 mg | DELAYED_RELEASE_TABLET | Freq: Every day | ORAL | 1 refills | Status: DC

## 2024-04-28 MED ORDER — PANTOPRAZOLE SODIUM 40 MG IV SOLR
40.0000 mg | Freq: Once | INTRAVENOUS | Status: AC
Start: 2024-04-28 — End: 2024-04-28
  Administered 2024-04-28 (×2): 40 mg via INTRAVENOUS
  Filled 2024-04-28: qty 10

## 2024-04-28 NOTE — ED Provider Notes (Signed)
 [facility] Provider Note    Event Date/Time   First MD Initiated Contact with Patient 04/28/24 1013     (approximate)   History   COVID+   HPI  [name] is a 53 y.o. female history of asthma, prediabetes, hydrocephalus with shunt, seizures, among multiple other chronic past medical problems see medical history, presents emergency department with complaints of weakness, cough that is productive of green mucus, vomiting, diarrhea.  Positive COVID test last Tuesday.  Has been on Paxlovid  via her doctor.  Had taken 1 dose of a Z-Pak, also has already taken a steroid pack.  States that she is feeling worse instead of better.  Denies fever, chills at this time.      Physical Exam   Triage Vital Signs: ED Triage Vitals  Encounter Vitals Group     BP 04/28/24 0952 (!) 168/109     Girls Systolic BP Percentile --      Girls Diastolic BP Percentile --      Boys Systolic BP Percentile --      Boys Diastolic BP Percentile --      Pulse Rate 04/28/24 0952 97     Resp 04/28/24 0952 19     Temp 04/28/24 0952 98.7 F (37.1 C)     Temp src --      SpO2 04/28/24 0952 100 %     Weight 04/28/24 0950 214 lb 6.5 oz (97.3 kg)     Height 04/28/24 0950 5' 3 (1.6 m)     Head Circumference --      Peak Flow --      Pain Score 04/28/24 1018 0     Pain Loc --      Pain Education --      Exclude from Growth Chart --     Most recent vital signs: Vitals:   04/28/24 1357 04/28/24 1413  BP: (!) 148/94 (!) 148/88  Pulse: 88 87  Resp: 18 17  Temp: 98.1 F (36.7 C) 98.1 F (36.7 C)  SpO2: 99% 99%     General: Awake, no distress.   CV:  Good peripheral perfusion. regular rate and  rhythm Resp:  Normal effort. Lungs CTA Abd:  No distention.   Other:      ED Results / Procedures / Treatments   Labs (all labs ordered are listed, but only abnormal results are displayed) Labs Reviewed  BASIC METABOLIC PANEL WITH GFR - Abnormal; Notable for the following  components:      Result Value   Potassium 3.0 (*)    Creatinine, Ser 1.01 (*)    All other components within normal limits  CBC  HEPATIC FUNCTION PANEL  MAGNESIUM     EKG     RADIOLOGY Chest x-ray    PROCEDURES:   Procedures  Critical Care:  no Chief Complaint  Patient presents with   COVID+      MEDICATIONS ORDERED IN ED: Medications  lactated ringers  bolus 1,000 mL (0 mLs Intravenous Stopped 04/28/24 1413)  potassium chloride  SA (KLOR-CON  M) CR tablet 40 mEq (40 mEq Oral Given 04/28/24 1105)  metoCLOPramide  (REGLAN ) injection 10 mg (10 mg Intravenous Given 04/28/24 1124)  pantoprazole  (PROTONIX ) injection 40 mg (40 mg Intravenous Given 04/28/24 1124)     IMPRESSION / MDM / ASSESSMENT AND PLAN / ED COURSE  I reviewed the triage vital signs and the nursing notes.  Differential diagnosis includes, but is not limited to, COVID, CAP, COVID-pneumonia, dehydration, electrolyte imbalance, weakness  Patient's presentation is most consistent with acute illness / injury with system symptoms.    Medications given: Reglan , Protonix  IV  Did not repeat COVID test with positive COVID test last week as it would not change her treatment.  CBC is reassuring, metabolic panel shows hypokalemia with a potassium of 3.0, magnesium is reassuring.  Remainder of her metabolic function reassuring.  Chest x-ray independently reviewed interpreted by me as having increased bronchial infiltrates in the right lower lung, radiologist read is reassuring.  However due to the patient's symptoms along with the change on the x-ray from her previous x-rays that I did review I do feel we should do a CT of the chest.  Patient was given LR 1 L IV, Reglan  10 mg IV and Protonix  40 mg IV, she was given potassium 40 mEq p.o.  CT of the chest was independently reviewed interpreted by me as being negative for any acute abnormality, confirmed by radiology  I did explain the  findings to the patient.  Explained to her she does not have a secondary pneumonia, do feel she is a little dehydrated, did replenish her potassium here in the ED.  She is to follow-up with her regular doctor.  She felt much better after the fluids, Reglan  and Protonix .  She was given a prescription for Reglan  and Protonix .  Follow-up with her regular doctor if not improving in 3 days.  Return if worsening.  She is in agreement treatment plan.  Discharged in stable condition.      FINAL CLINICAL IMPRESSION(S) / ED DIAGNOSES   Final diagnoses:  COVID     Rx / DC Orders   ED Discharge Orders          Ordered    metoCLOPramide  (REGLAN ) 10 MG tablet  3 times daily with meals        04/28/24 1344    pantoprazole  (PROTONIX ) 40 MG tablet  Daily        04/28/24 1344             Note:  This document was prepared using Dragon voice recognition software and may include unintentional dictation errors.    Gasper Devere ORN, PA-C 04/28/24 1755    Suzanne Kirsch, MD 04/29/24 1620

## 2024-04-28 NOTE — ED Triage Notes (Addendum)
 Pt comes with c/o covid. Pt was dx last Tuesday. Pt states cough is worse and she is vomiting. Pt states she was on plaxovid and has one dose left. Pt states she is not getting better. Pt states worse at night when laying down.

## 2024-04-28 NOTE — Discharge Instructions (Signed)
 Please call them for an appointment.  We

## 2024-04-30 NOTE — Procedures (Signed)
 [facility] [address] [place], [id number]    Complete Pulmonary Function Testing Interpretation:  FINDINGS:  The forced vital capacity is normal.  FEV1 is normal.  FEV1 FVC ratio was normal.  Postbronchodilator there was no significant change in FEV1.  Total lung capacity is mildly decreased.  Residual volume is decreased.  FRC is decreased.  DLCO was normal.  IMPRESSION:  This pulmonary function study is suggestive of mild restrictive lung disease with a normal DLCO clinical correlation is recommended.  Danielle DELENA Bathe, MD [facility] Pulmonary Critical Care Medicine Sleep Medicine

## 2024-05-06 NOTE — Progress Notes (Signed)
   05/06/2024  Patient ID: [name], female   DOB: 01-17-1971, 53 y.o.   MRN: [id number]  Pharmacy Quality Measure Review  This patient is appearing on a report for being at risk of failing the adherence measure for cholesterol (statin) and hypertension (ACEi/ARB) medications this calendar year.   Medication: Losartan /hydrochlorothiazide  and Rosuvastatin  Last fill date: 03/29/24 for 90 day supply  Insurance report was not up to date. No action needed at this time.   Jon VEAR Lindau, PharmD Clinical Pharmacist [phone]

## 2024-05-07 ENCOUNTER — Ambulatory Visit

## 2024-05-08 ENCOUNTER — Encounter: Payer: Self-pay | Admitting: Oncology

## 2024-05-08 LAB — PULMONARY FUNCTION TEST

## 2024-05-13 ENCOUNTER — Ambulatory Visit: Payer: PPO | Admitting: Nurse Practitioner

## 2024-05-16 ENCOUNTER — Ambulatory Visit: Admitting: Cardiology

## 2024-05-16 ENCOUNTER — Encounter: Payer: Self-pay | Admitting: Oncology

## 2024-05-16 ENCOUNTER — Encounter: Payer: Self-pay | Admitting: Cardiology

## 2024-05-16 ENCOUNTER — Ambulatory Visit

## 2024-05-16 VITALS — BP 128/86 | HR 82 | Ht 63.0 in | Wt 213.0 lb

## 2024-05-16 DIAGNOSIS — Z0000 Encounter for general adult medical examination without abnormal findings: Secondary | ICD-10-CM

## 2024-05-16 DIAGNOSIS — Z0001 Encounter for general adult medical examination with abnormal findings: Secondary | ICD-10-CM

## 2024-05-16 DIAGNOSIS — Z0130 Encounter for examination of blood pressure without abnormal findings: Secondary | ICD-10-CM

## 2024-05-16 DIAGNOSIS — E538 Deficiency of other specified B group vitamins: Secondary | ICD-10-CM

## 2024-05-16 MED ORDER — CYANOCOBALAMIN 1000 MCG/ML IJ SOLN
1000.0000 ug | Freq: Once | INTRAMUSCULAR | Status: AC
  Administered 2024-05-16: 1000 ug via INTRAMUSCULAR
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
## 2024-05-22 DIAGNOSIS — R2689 Other abnormalities of gait and mobility: Secondary | ICD-10-CM | POA: Diagnosis not present

## 2024-05-22 DIAGNOSIS — E559 Vitamin D deficiency, unspecified: Secondary | ICD-10-CM | POA: Diagnosis not present

## 2024-05-22 DIAGNOSIS — G43709 Chronic migraine without aura, not intractable, without status migrainosus: Secondary | ICD-10-CM | POA: Diagnosis not present

## 2024-05-22 DIAGNOSIS — Q031 Atresia of foramina of Magendie and Luschka: Secondary | ICD-10-CM | POA: Diagnosis not present

## 2024-05-22 DIAGNOSIS — M7918 Myalgia, other site: Secondary | ICD-10-CM | POA: Diagnosis not present

## 2024-05-22 DIAGNOSIS — G4733 Obstructive sleep apnea (adult) (pediatric): Secondary | ICD-10-CM | POA: Diagnosis not present

## 2024-05-26 ENCOUNTER — Ambulatory Visit: Admitting: Internal Medicine

## 2024-05-28 NOTE — Progress Notes (Unsigned)
 Cardiology Office Note   Date:  05/30/2024  ID:  [name], DOB [birth year], MRN [id number] PCP: Fernand Fredy RAMAN, MD  [facility] Providers Cardiologist:  Caron Poser, MD     History of Present Illness [name] is a 53 y.o. female PMH HTN, HLD with statin intolerance, chronic migraines, Dandy-Walker malformation prior VP shunt who presents to establish care.  Patient reports chest pain that is exertional and accompanied by extreme fatigue.  She also says that she is bothered by frequent headaches and swelling that seems to be occurring in her trapezius region.  She says that she is under a lot of stress at home.  She recently saw her neurologist and they did not recommend any repeat imaging regarding her VP shunt/hydrocephalus.  Relevant CVD History -Coronary CT angiogram 03/2024 CAD RADS 0 without anomalous coronaries - TTE 03/2024 LVEF 60 to 65%, normal RV function, no significant valvular disease - Monitor 06/2023 with rare ectopy and mean heart rate 71 bpm without arrhythmia - Normal invasive coronary angiogram 04/2018 with mildly elevated LVEDP   ROS: Pt denies any chest discomfort, jaw pain, arm pain, palpitations, syncope, presyncope, orthopnea, PND, or LE edema.  Studies Reviewed I have independently reviewed the patient's ECG, previous cardiac testing, and previous medical record.  Physical Exam VS:  BP 102/76 (BP Location: Left Arm, Patient Position: Sitting, Cuff Size: Normal)   Pulse 64   Ht 5' 3 (1.6 m)   Wt 214 lb 3.2 oz (97.2 kg)   SpO2 98%   BMI 37.94 kg/m        Wt Readings from Last 3 Encounters:  05/30/24 214 lb 3.2 oz (97.2 kg)  05/16/24 213 lb (96.6 kg)  04/28/24 214 lb 6.5 oz (97.3 kg)    GEN: No acute distress. NECK: No JVD; No carotid bruits. CARDIAC: RRR, no murmurs, rubs, gallops. RESPIRATORY:  Clear to auscultation. EXTREMITIES:  Warm and well-perfused. No edema.  ASSESSMENT AND PLAN DOE Chest  discomfort Obesity Patient presents with DOE and exertional chest discomfort.  She also has chronic fatigue.  She has had a very reassuring workup thus far with a normal echocardiogram and a normal coronary CT angiogram with no stenosis or plaque.  She reports that she is under a lot of stress at home.  We discussed the possibility of microvascular dysfunction, though it is quite challenging to diagnose and there are very limited treatment options available.  Plan: - For now, reassuring cardiovascular studies.  Plan to optimize stressors at home and treat underlying anxiety/depression to see if this helps - If her symptoms are persistent despite conservative measures, then we can do further testing for microvascular disease such as stress PET or even invasive hemodynamics - As above, there are limited treatment options available for microvascular dysfunction.  Could do a trial of calcium  channel blocker or low-dose Imdur if testing is suggestive of microvascular disease.  Continue weight loss efforts in the meantime.  She reports that her insurance will not pay for GLP-1s which is unfortunate.  Headaches Fatigue History of VP shunt Patient reports extreme fatigue and headaches. On chart review, it does not look as though she has had any recent imaging of her brain or VP shunt.  We discussed that this is well outside my scope of practice to order any additional imaging or further workup of any of these complex neurologic issues.  Therefore, per her request, we will refer her to [facility] neurology/neurosurgery for a second opinion and consideration  of further imaging if indicated.        Dispo: RTC 6 months or sooner as needed  Signed, Caron Poser, MD

## 2024-05-30 ENCOUNTER — Other Ambulatory Visit: Payer: Self-pay | Admitting: *Deleted

## 2024-05-30 ENCOUNTER — Ambulatory Visit

## 2024-05-30 VITALS — BP 102/76 | HR 64 | Ht 63.0 in | Wt 214.2 lb

## 2024-05-30 DIAGNOSIS — R519 Headache, unspecified: Secondary | ICD-10-CM | POA: Diagnosis not present

## 2024-05-30 DIAGNOSIS — G919 Hydrocephalus, unspecified: Secondary | ICD-10-CM

## 2024-05-30 DIAGNOSIS — G8929 Other chronic pain: Secondary | ICD-10-CM

## 2024-05-30 DIAGNOSIS — R0609 Other forms of dyspnea: Secondary | ICD-10-CM

## 2024-05-30 DIAGNOSIS — R5382 Chronic fatigue, unspecified: Secondary | ICD-10-CM | POA: Diagnosis not present

## 2024-05-30 DIAGNOSIS — R079 Chest pain, unspecified: Secondary | ICD-10-CM | POA: Diagnosis not present

## 2024-05-30 DIAGNOSIS — Z982 Presence of cerebrospinal fluid drainage device: Secondary | ICD-10-CM

## 2024-05-30 NOTE — Patient Instructions (Signed)
 Medication Instructions:   Your physician recommends that you continue on your current medications as directed. Please refer to the Current Medication list given to you today.   *If you need a refill on your cardiac medications before your next appointment, please call your pharmacy*  Lab Work:  No labs ordered today  If you have labs (blood work) drawn today and your tests are completely normal, you will receive your results only by: MyChart Message (if you have MyChart) OR A paper copy in the mail If you have any lab test that is abnormal or we need to change your treatment, we will call you to review the results.  Testing/Procedures:  Your physician has requested that you have a carotid duplex. This test is an ultrasound of the carotid arteries in your neck. It looks at blood flow through these arteries that supply the brain with blood.   Allow one hour for this exam.  There are no restrictions or special instructions.  This will take place at [address] [facility] [address], [place] [address]  Please note: We ask at that you not bring children with you during ultrasound (echo/ vascular) testing. Due to room size and safety concerns, children are not allowed in the ultrasound rooms during exams. Our front office staff cannot provide observation of children in our lobby area while testing is being conducted. An adult accompanying a patient to their appointment will only be allowed in the ultrasound room at the discretion of the ultrasound technician under special circumstances. We apologize for any inconvenience.   Follow-Up:  REFERRAL:   [facility]  [address], [place], [place] [address]   At [facility], you and your health needs are our priority.  As part of our continuing mission to provide you with exceptional heart care, our providers are all part of one team.  This team includes your primary Cardiologist (physician) and  Advanced Practice Providers or APPs (Physician Assistants and Nurse Practitioners) who all work together to provide you with the care you need, when you need it.  Your next appointment:   6 month(s)  Provider:   Caron Poser, MD      We recommend signing up for the patient portal called MyChart.  Sign up information is provided on this After Visit Summary.  MyChart is used to connect with patients for Virtual Visits (Telemedicine).  Patients are able to view lab/test results, encounter notes, upcoming appointments, etc.  Non-urgent messages can be sent to your provider as well.   To learn more about what you can do with MyChart, go to [email or web address].

## 2024-06-05 ENCOUNTER — Ambulatory Visit: Admitting: Nurse Practitioner

## 2024-06-05 ENCOUNTER — Encounter: Payer: Self-pay | Admitting: Nurse Practitioner

## 2024-06-05 VITALS — BP 127/88 | HR 73 | Temp 96.9°F | Resp 16 | Ht 63.0 in | Wt 212.8 lb

## 2024-06-05 DIAGNOSIS — R1012 Left upper quadrant pain: Secondary | ICD-10-CM

## 2024-06-05 DIAGNOSIS — Z77098 Contact with and (suspected) exposure to other hazardous, chiefly nonmedicinal, chemicals: Secondary | ICD-10-CM

## 2024-06-05 DIAGNOSIS — R233 Spontaneous ecchymoses: Secondary | ICD-10-CM | POA: Diagnosis not present

## 2024-06-05 DIAGNOSIS — K625 Hemorrhage of anus and rectum: Secondary | ICD-10-CM

## 2024-06-05 DIAGNOSIS — E876 Hypokalemia: Secondary | ICD-10-CM | POA: Diagnosis not present

## 2024-06-05 DIAGNOSIS — R1011 Right upper quadrant pain: Secondary | ICD-10-CM
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
## 2024-06-06 ENCOUNTER — Encounter: Payer: Self-pay | Admitting: Nurse Practitioner

## 2024-06-06 ENCOUNTER — Telehealth: Payer: Self-pay | Admitting: Nurse Practitioner

## 2024-06-06 ENCOUNTER — Telehealth: Payer: Self-pay

## 2024-06-06 ENCOUNTER — Ambulatory Visit: Admitting: Neurosurgery

## 2024-06-06 LAB — BASIC METABOLIC PANEL WITH GFR
BUN/Creatinine Ratio: 8 — ABNORMAL LOW (ref 9–23)
BUN: 8 mg/dL (ref 6–24)
CO2: 22 mmol/L (ref 20–29)
Calcium: 10.2 mg/dL (ref 8.7–10.2)
Chloride: 105 mmol/L (ref 96–106)
Creatinine, Ser: 1.06 mg/dL — ABNORMAL HIGH (ref 0.57–1.00)
Glucose: 106 mg/dL — ABNORMAL HIGH (ref 70–99)
Potassium: 3.6 mmol/L (ref 3.5–5.2)
Sodium: 143 mmol/L (ref 134–144)
eGFR: 62 mL/min/1.73 (ref >59)

## 2024-06-06 LAB — CBC WITH DIFFERENTIAL/PLATELET
Basophils Absolute: 0.1 x10E3/uL (ref 0.0–0.2)
Basos: 1 %
EOS (ABSOLUTE): 0.2 x10E3/uL (ref 0.0–0.4)
Eos: 2 %
Hematocrit: 41.6 % (ref 34.0–46.6)
Hemoglobin: 13.8 g/dL (ref 11.1–15.9)
Immature Grans (Abs): 0 x10E3/uL (ref 0.0–0.1)
Immature Granulocytes: 0 %
Lymphocytes Absolute: 1.9 x10E3/uL (ref 0.7–3.1)
Lymphs: 21 %
MCH: 30.2 pg (ref 26.6–33.0)
MCHC: 33.2 g/dL (ref 31.5–35.7)
MCV: 91 fL (ref 79–97)
Monocytes Absolute: 0.3 x10E3/uL (ref 0.1–0.9)
Monocytes: 4 %
Neutrophils Absolute: 6.3 x10E3/uL (ref 1.4–7.0)
Neutrophils: 72 %
Platelets: 317 x10E3/uL (ref 150–450)
RBC: 4.57 x10E6/uL (ref 3.77–5.28)
RDW: 13.7 % (ref 11.7–15.4)
WBC: 8.8 x10E3/uL (ref 3.4–10.8)

## 2024-06-06 LAB — IRON,TIBC AND FERRITIN PANEL
Ferritin: 143 ng/mL (ref 15–150)
Iron Saturation: 33 % (ref 15–55)
Iron: 91 ug/dL (ref 27–159)
Total Iron Binding Capacity: 273 ug/dL (ref 250–450)
UIBC: 182 ug/dL (ref 131–425)

## 2024-06-06 LAB — B12 AND FOLATE PANEL
Folate: 4 ng/mL (ref >3.0)
Vitamin B-12: 576 pg/mL (ref 232–1245)

## 2024-06-06 LAB — D-DIMER, QUANTITATIVE: D-DIMER: 0.43 mg{FEU}/L (ref 0.00–0.49)

## 2024-06-06 LAB — CHOLINESTERASE, SERUM: Cholinesterase, Serum: 2553 IU/L (ref 1355–3299)

## 2024-06-06 LAB — PROTIME-INR
INR: 1 (ref 0.9–1.2)
Prothrombin Time: 10.4 s (ref 9.1–12.0)

## 2024-06-06 NOTE — Telephone Encounter (Signed)
 Per Alyssa she will go over them with patient at her next appointment. Patient was notified.

## 2024-06-06 NOTE — Telephone Encounter (Signed)
 Urgent Gastroenterology referral sent via [facility] to [facility].  Notified patient. Gave telephone # [phone]

## 2024-06-06 NOTE — Telephone Encounter (Signed)
 Received call from [facility] requesting to change CT order to abd/pelvis with contrast since no indication of abdominal bleeding. I called back to [facility], sw/ Pansy, told her provider wants to leave order as is to make sure no internal bleeding-Toni

## 2024-06-06 NOTE — Progress Notes (Signed)
   06/06/2024  Patient ID: Consuelo LITTIE Fabry, female   DOB: 09/02/1971, 53 y.o.   MRN: [id number]  Pharmacy Quality Measure Review  This patient is appearing on a report for being at risk of failing the adherence measure for cholesterol (statin) and hypertension (ACEi/ARB) medications this calendar year.   Medication: Losartan /hydrochlorothiazide  and Rosuvastatin  Last fill date: 03/29/24 for 90 day supply  Confirmed with pharmacy that RF are available for both meds, scheduled for 06/26/24. Patient aware.  Jon VEAR Lindau, PharmD Clinical Pharmacist [phone]

## 2024-06-10 ENCOUNTER — Encounter: Payer: Self-pay | Admitting: Oncology

## 2024-06-10 ENCOUNTER — Ambulatory Visit
Admission: RE | Admit: 2024-06-10 | Discharge: 2024-06-10 | Disposition: A | Discharge status: Home / Self Care | Source: Ambulatory Visit | Attending: Nurse Practitioner | Admitting: Nurse Practitioner

## 2024-06-10 DIAGNOSIS — N261 Atrophy of kidney (terminal): Secondary | ICD-10-CM | POA: Diagnosis not present

## 2024-06-10 DIAGNOSIS — Z77098 Contact with and (suspected) exposure to other hazardous, chiefly nonmedicinal, chemicals: Secondary | ICD-10-CM

## 2024-06-10 DIAGNOSIS — K625 Hemorrhage of anus and rectum: Secondary | ICD-10-CM

## 2024-06-10 DIAGNOSIS — R1011 Right upper quadrant pain: Secondary | ICD-10-CM

## 2024-06-10 DIAGNOSIS — R233 Spontaneous ecchymoses: Secondary | ICD-10-CM

## 2024-06-10 DIAGNOSIS — R1012 Left upper quadrant pain: Secondary | ICD-10-CM

## 2024-06-10 MED ORDER — IOPAMIDOL (ISOVUE-300) INJECTION 61%
100.0000 mL | Freq: Once | INTRAVENOUS | Status: AC | PRN
Start: 2024-06-10 — End: 2024-06-10
  Administered 2024-06-10: 100 mL via INTRAVENOUS

## 2024-06-12 ENCOUNTER — Telehealth: Payer: Self-pay | Admitting: Nurse Practitioner

## 2024-06-12 NOTE — Telephone Encounter (Signed)
 Gastroenterology appointment 11/05/2024 with [facility]-Toni

## 2024-06-17 ENCOUNTER — Ambulatory Visit

## 2024-06-26 ENCOUNTER — Encounter: Payer: Self-pay | Admitting: Nurse Practitioner

## 2024-06-26 ENCOUNTER — Ambulatory Visit (INDEPENDENT_AMBULATORY_CARE_PROVIDER_SITE_OTHER): Admitting: Nurse Practitioner

## 2024-06-26 VITALS — BP 130/75 | HR 78 | Temp 96.3°F | Resp 16 | Ht 63.0 in | Wt 215.0 lb

## 2024-06-26 DIAGNOSIS — Z982 Presence of cerebrospinal fluid drainage device: Secondary | ICD-10-CM

## 2024-06-26 DIAGNOSIS — J4540 Moderate persistent asthma, uncomplicated: Secondary | ICD-10-CM | POA: Diagnosis not present

## 2024-06-26 DIAGNOSIS — G43E09 Chronic migraine with aura, not intractable, without status migrainosus: Secondary | ICD-10-CM | POA: Diagnosis not present

## 2024-06-26 DIAGNOSIS — E66812 Obesity, class 2: Secondary | ICD-10-CM

## 2024-06-26 DIAGNOSIS — N1831 Chronic kidney disease, stage 3a: Secondary | ICD-10-CM

## 2024-06-26 DIAGNOSIS — Z6838 Body mass index (BMI) 38.0-38.9, adult: Secondary | ICD-10-CM

## 2024-06-26 DIAGNOSIS — K219 Gastro-esophageal reflux disease without esophagitis: Secondary | ICD-10-CM

## 2024-06-26 DIAGNOSIS — G40909 Epilepsy, unspecified, not intractable, without status epilepticus: Secondary | ICD-10-CM

## 2024-06-26 DIAGNOSIS — N182 Chronic kidney disease, stage 2 (mild): Secondary | ICD-10-CM | POA: Insufficient documentation

## 2024-06-26 DIAGNOSIS — G919 Hydrocephalus, unspecified: Secondary | ICD-10-CM

## 2024-06-26 DIAGNOSIS — Q031 Atresia of foramina of Magendie and Luschka: Secondary | ICD-10-CM

## 2024-06-26 MED ORDER — PHENTERMINE HCL 15 MG PO CAPS: 15.0000 mg | ORAL_CAPSULE | ORAL | 1 refills | Status: DC
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
## 2024-07-02 NOTE — Patient Instructions (Signed)
 Preventive Care 55-55 Years Old, Female 53 Preventive care refers to lifestyle choices and visits with your health care provider that can promote health and wellness. Preventive care visits are also called wellness exams.  What can I expect for my preventive care visit?  Counseling  Your health care provider may ask you questions about your:  Medical history, including:  Past medical problems.  Family medical history.  Pregnancy history.  Current health, including:  Menstrual cycle.  Method of birth control.  Emotional well-being.  Home life and relationship well-being.  Sexual activity and sexual health.  Lifestyle, including:  Alcohol, nicotine or tobacco, and drug use.  Access to firearms.  Diet, exercise, and sleep habits.  Work and work Astronomer.  Sunscreen use.  Safety issues such as seatbelt and bike helmet use.  Physical exam  Your health care provider will check your:  Height and weight. These may be used to calculate your BMI (body mass index). BMI is a measurement that tells if you are at a healthy weight.  Waist circumference. This measures the distance around your waistline. This measurement also tells if you are at a healthy weight and may help predict your risk of certain diseases, such as type 2 diabetes and high blood pressure.  Heart rate and blood pressure.  Body temperature.  Skin for abnormal spots.  What immunizations do I need?    Vaccines are usually given at 55 various ages, according to a schedule. Your health care provider will recommend vaccines for you based on your age, medical history, and lifestyle or other factors, such as travel or where you work.  What tests do I need?  Screening  Your health care provider may recommend screening tests for certain conditions. This may include:  Lipid and cholesterol levels.  Diabetes screening. This is done by checking your blood sugar (glucose) after you have not eaten for a while (fasting).  Pelvic exam and Pap test.  Hepatitis B test.  Hepatitis C  test.  HIV (human immunodeficiency virus) test.  STI (sexually transmitted infection) testing, if you are at risk.  Lung cancer screening.  Colorectal cancer screening.  Mammogram. Talk with your health care provider about when you should start having regular mammograms. This may depend on whether you have a family history of breast cancer.  BRCA-related cancer screening. This may be done if you have a family history of breast, ovarian, tubal, or peritoneal cancers.  Bone density scan. This is done to screen for osteoporosis.  Talk with your health care provider about your test results, treatment options, and if necessary, the need for more tests.  Follow these instructions at home:  Eating and drinking    Eat a diet that includes fresh fruits and vegetables, whole grains, lean protein, and low-fat dairy products.  Take vitamin and mineral supplements as recommended by your health care provider.  Do not drink alcohol if:  Your health care provider tells you not to drink.  You are pregnant, may be pregnant, or are planning to become pregnant.  If you drink alcohol:  Limit how much you have to 0-1 drink a day.  Know how much alcohol is in your drink. In the [place] one drink equals one 12 oz bottle of beer (355 mL), one 5 oz glass of wine (148 mL), or one 1 oz glass of hard liquor (44 mL).  Lifestyle  Brush your teeth every morning and night with fluoride toothpaste. Floss one time each day.  Exercise for at least  30 minutes 5 or more days each week.  Do not use any products that contain nicotine or tobacco. These products include cigarettes, chewing tobacco, and vaping devices, such as e-cigarettes. If you need help quitting, ask your health care provider.  Do not use drugs.  If you are sexually active, practice safe sex. Use a condom or other form of protection to prevent STIs.  If you do not wish to become pregnant, use a form of birth control. If you plan to become pregnant, see your health care provider for a  prepregnancy visit.  Take aspirin only as told by your health care provider. Make sure that you understand how much to take and what form to take. Work with your health care provider to find out whether it is safe and beneficial for you to take aspirin daily.  Find healthy ways to manage stress, such as:  Meditation, yoga, or listening to music.  Journaling.  Talking to a trusted person.  Spending time with friends and family.  Minimize exposure to UV radiation to reduce your risk of skin cancer.  Safety  Always wear your seat belt while driving or riding in a vehicle.  Do not drive:  If you have been drinking alcohol. Do not ride with someone who has been drinking.  When you are tired or distracted.  While texting.  If you have been using any mind-altering substances or drugs.  Wear a helmet and other protective equipment during sports activities.  If you have firearms in your house, make sure you follow all gun safety procedures.  Seek help if you have been physically or sexually abused.  What's next?  Visit your health care provider once a year for an annual wellness visit.  Ask your health care provider how often you should have your eyes and teeth checked.  Stay up to date on all vaccines.  This information is not intended to replace advice given to you by your health care provider. Make sure you discuss any questions you have with your health care provider.  Document Revised: 03/02/2021 Document Reviewed: 03/02/2021  Elsevier Patient Education  2024 [employer].

## 2024-07-03 ENCOUNTER — Encounter: Payer: Self-pay | Admitting: Oncology

## 2024-07-08 ENCOUNTER — Encounter: Payer: Self-pay | Admitting: Obstetrics

## 2024-07-08 ENCOUNTER — Other Ambulatory Visit: Payer: Self-pay | Admitting: Obstetrics

## 2024-07-08 ENCOUNTER — Ambulatory Visit: Admitting: Obstetrics

## 2024-07-08 ENCOUNTER — Encounter: Payer: Self-pay | Admitting: Oncology

## 2024-07-08 VITALS — BP 102/70 | HR 85 | Wt 213.8 lb

## 2024-07-08 DIAGNOSIS — Z1231 Encounter for screening mammogram for malignant neoplasm of breast: Secondary | ICD-10-CM

## 2024-07-08 DIAGNOSIS — Z01419 Encounter for gynecological examination (general) (routine) without abnormal findings: Secondary | ICD-10-CM

## 2024-07-08 DIAGNOSIS — Z90710 Acquired absence of both cervix and uterus: Secondary | ICD-10-CM

## 2024-07-08 DIAGNOSIS — Z1331 Encounter for screening for depression: Secondary | ICD-10-CM

## 2024-07-09 ENCOUNTER — Ambulatory Visit: Payer: Self-pay | Admitting: Internal Medicine

## 2024-07-10 ENCOUNTER — Ambulatory Visit

## 2024-07-21 ENCOUNTER — Encounter: Payer: Self-pay | Admitting: Nurse Practitioner

## 2024-08-11 ENCOUNTER — Ambulatory Visit

## 2024-08-11 DIAGNOSIS — I6522 Occlusion and stenosis of left carotid artery: Secondary | ICD-10-CM

## 2024-08-11 DIAGNOSIS — R0609 Other forms of dyspnea: Secondary | ICD-10-CM

## 2024-08-11 DIAGNOSIS — G8929 Other chronic pain: Secondary | ICD-10-CM

## 2024-08-12 ENCOUNTER — Ambulatory Visit
Admission: RE | Admit: 2024-08-12 | Discharge: 2024-08-12 | Disposition: A | Discharge status: Home / Self Care | Source: Ambulatory Visit | Attending: Obstetrics | Admitting: Obstetrics

## 2024-08-12 ENCOUNTER — Ambulatory Visit: Admitting: Cardiology

## 2024-08-12 ENCOUNTER — Telehealth: Payer: Self-pay | Admitting: Nurse Practitioner

## 2024-08-12 DIAGNOSIS — Z1231 Encounter for screening mammogram for malignant neoplasm of breast: Secondary | ICD-10-CM | POA: Insufficient documentation

## 2024-08-12 NOTE — Telephone Encounter (Signed)
 Patient called requesting sick visit for possible strep throat. She was not able to come for the times I offered for this afternoon or in the morning-Toni

## 2024-08-13 ENCOUNTER — Ambulatory Visit: Payer: Self-pay

## 2024-08-18 ENCOUNTER — Encounter: Payer: Self-pay | Admitting: Oncology

## 2024-08-21 ENCOUNTER — Ambulatory Visit: Admitting: Nurse Practitioner

## 2024-08-22 ENCOUNTER — Ambulatory Visit: Admitting: Cardiology

## 2024-09-16 ENCOUNTER — Encounter: Payer: Self-pay | Admitting: Nurse Practitioner

## 2024-09-16 ENCOUNTER — Ambulatory Visit (INDEPENDENT_AMBULATORY_CARE_PROVIDER_SITE_OTHER): Admitting: Nurse Practitioner

## 2024-09-16 VITALS — BP 102/72 | HR 69 | Temp 96.9°F | Resp 16 | Ht 63.0 in | Wt 212.8 lb

## 2024-09-16 DIAGNOSIS — N1831 Chronic kidney disease, stage 3a: Secondary | ICD-10-CM

## 2024-09-16 DIAGNOSIS — E781 Pure hyperglyceridemia: Secondary | ICD-10-CM | POA: Diagnosis not present

## 2024-09-16 DIAGNOSIS — E559 Vitamin D deficiency, unspecified: Secondary | ICD-10-CM
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
## 2024-09-20 ENCOUNTER — Encounter: Payer: Self-pay | Admitting: Nurse Practitioner

## 2024-09-20 ENCOUNTER — Other Ambulatory Visit: Payer: Self-pay | Admitting: Cardiology

## 2024-09-21 ENCOUNTER — Telehealth: Payer: Self-pay | Admitting: Cardiology

## 2024-09-22 NOTE — Telephone Encounter (Signed)
" °*  STAT* If patient is at the pharmacy, call can be transferred to refill team.   1. Which medications need to be refilled? (please list name of each medication and dose if known)  losartan -hydrochlorothiazide  (HYZAAR) 50-12.5 MG tablet  ezetimibe  (ZETIA ) 10 MG tablet    2. Would you like to learn more about the convenience, safety, & potential cost savings by using the [facility]?      3. Are you open to using the [facility] (Type [facility].  ).   4. Which pharmacy/location (including street and city if local pharmacy) is medication to be sent to? [id number] - [place], [place] - [address]    5. Do they need a 30 day or 90 day supply? 90 day  "

## 2024-09-23 MED ORDER — EZETIMIBE 10 MG PO TABS: 10.0000 mg | ORAL_TABLET | Freq: Every day | ORAL | 1 refills | Status: AC

## 2024-09-24 ENCOUNTER — Other Ambulatory Visit: Payer: Self-pay | Admitting: Cardiology

## 2024-09-24 DIAGNOSIS — R0602 Shortness of breath: Secondary | ICD-10-CM

## 2024-09-30 ENCOUNTER — Other Ambulatory Visit: Payer: Self-pay

## 2024-09-30 MED ORDER — PANTOPRAZOLE SODIUM 40 MG PO TBEC: 40.0000 mg | DELAYED_RELEASE_TABLET | Freq: Every day | ORAL | 1 refills | Status: DC

## 2024-10-03 LAB — BASIC METABOLIC PANEL WITH GFR
BUN/Creatinine Ratio: 8 — ABNORMAL LOW (ref 9–23)
BUN: 7 mg/dL (ref 6–24)
CO2: 22 mmol/L (ref 20–29)
Calcium: 9.1 mg/dL (ref 8.7–10.2)
Chloride: 107 mmol/L — ABNORMAL HIGH (ref 96–106)
Creatinine, Ser: 0.87 mg/dL (ref 0.57–1.00)
Glucose: 105 mg/dL — ABNORMAL HIGH (ref 70–99)
Potassium: 4 mmol/L (ref 3.5–5.2)
Sodium: 144 mmol/L (ref 134–144)
eGFR: 78 mL/min/1.73

## 2024-10-03 LAB — VITAMIN D 25 HYDROXY (VIT D DEFICIENCY, FRACTURES): Vit D, 25-Hydroxy: 18.8 ng/mL — ABNORMAL LOW (ref 30.0–100.0)

## 2024-10-03 LAB — LIPID PANEL
Chol/HDL Ratio: 4.6 ratio — ABNORMAL HIGH (ref 0.0–4.4)
Cholesterol, Total: 202 mg/dL — ABNORMAL HIGH (ref 100–199)
HDL: 44 mg/dL
LDL Chol Calc (NIH): 126 mg/dL — ABNORMAL HIGH (ref 0–99)
Triglycerides: 178 mg/dL — ABNORMAL HIGH (ref 0–149)
VLDL Cholesterol Cal: 32 mg/dL (ref 5–40)

## 2024-10-06 ENCOUNTER — Ambulatory Visit: Payer: Self-pay | Admitting: Nurse Practitioner

## 2024-10-06 NOTE — Progress Notes (Signed)
 Her labs have been reviewed and will be discussed at her upcoming office visit next week

## 2024-10-09 ENCOUNTER — Encounter: Payer: Self-pay | Admitting: Oncology

## 2024-10-16 ENCOUNTER — Ambulatory Visit (INDEPENDENT_AMBULATORY_CARE_PROVIDER_SITE_OTHER): Payer: Self-pay | Admitting: Nurse Practitioner

## 2024-10-16 ENCOUNTER — Encounter: Payer: Self-pay | Admitting: Oncology

## 2024-10-16 ENCOUNTER — Encounter: Payer: Self-pay | Admitting: Nurse Practitioner

## 2024-10-16 VITALS — BP 122/77 | HR 83 | Temp 96.1°F | Resp 16 | Ht 63.0 in | Wt 216.6 lb

## 2024-10-16 DIAGNOSIS — E66812 Obesity, class 2: Secondary | ICD-10-CM | POA: Diagnosis not present

## 2024-10-16 DIAGNOSIS — E782 Mixed hyperlipidemia: Secondary | ICD-10-CM

## 2024-10-16 DIAGNOSIS — E559 Vitamin D deficiency, unspecified: Secondary | ICD-10-CM

## 2024-10-16 DIAGNOSIS — Z6838 Body mass index (BMI) 38.0-38.9, adult: Secondary | ICD-10-CM | POA: Diagnosis not present

## 2024-10-16 DIAGNOSIS — J0110 Acute frontal sinusitis, unspecified: Secondary | ICD-10-CM

## 2024-10-16 DIAGNOSIS — R051 Acute cough: Secondary | ICD-10-CM | POA: Diagnosis not present

## 2024-10-16 DIAGNOSIS — N182 Chronic kidney disease, stage 2 (mild): Secondary | ICD-10-CM | POA: Diagnosis not present

## 2024-10-16 DIAGNOSIS — K915 Postcholecystectomy syndrome: Secondary | ICD-10-CM | POA: Diagnosis not present

## 2024-10-16 MED ORDER — ROSUVASTATIN CALCIUM 40 MG PO TABS: 40.0000 mg | ORAL_TABLET | Freq: Every day | ORAL | 1 refills | Status: AC

## 2024-10-16 MED ORDER — BENZONATATE 200 MG PO CAPS: 200.0000 mg | ORAL_CAPSULE | Freq: Three times a day (TID) | ORAL | 0 refills | Status: AC | PRN

## 2024-10-16 MED ORDER — AZITHROMYCIN 250 MG PO TABS: ORAL_TABLET | ORAL | 0 refills | Status: AC

## 2024-10-16 MED ORDER — VITAMIN D (ERGOCALCIFEROL) 1.25 MG (50000 UNIT) PO CAPS: 50000.0000 [IU] | ORAL_CAPSULE | ORAL | 1 refills | Status: AC

## 2024-10-16 MED ORDER — DICYCLOMINE HCL 10 MG PO CAPS: 10.0000 mg | ORAL_CAPSULE | Freq: Three times a day (TID) | ORAL | 0 refills | Status: AC
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
## 2024-10-20 ENCOUNTER — Ambulatory Visit: Payer: Self-pay | Admitting: Internal Medicine

## 2024-10-21 ENCOUNTER — Encounter: Payer: Self-pay | Admitting: Oncology

## 2024-10-22 ENCOUNTER — Other Ambulatory Visit: Payer: Self-pay | Admitting: Nurse Practitioner

## 2024-10-30 ENCOUNTER — Ambulatory Visit: Admitting: Nurse Practitioner

## 2024-11-04 ENCOUNTER — Ambulatory Visit: Admitting: Internal Medicine

## 2024-11-20 ENCOUNTER — Ambulatory Visit: Payer: Self-pay

## 2025-01-01 ENCOUNTER — Ambulatory Visit: Payer: Self-pay | Admitting: Physician Assistant

## 2025-03-25 ENCOUNTER — Ambulatory Visit: Admitting: Nurse Practitioner

## 2025-04-22 ENCOUNTER — Encounter: Payer: Self-pay | Admitting: Internal Medicine
